# Patient Record
Sex: Female | Born: 1955
Health system: Southern US, Community
[De-identification: ages and names within clinical notes are randomized; demographics above are authoritative.]

## PROBLEM LIST (undated history)

## (undated) DIAGNOSIS — F32A Depression, unspecified: Secondary | ICD-10-CM

## (undated) DIAGNOSIS — F909 Attention-deficit hyperactivity disorder, unspecified type: Secondary | ICD-10-CM

## (undated) DIAGNOSIS — K219 Gastro-esophageal reflux disease without esophagitis: Secondary | ICD-10-CM

## (undated) DIAGNOSIS — G43909 Migraine, unspecified, not intractable, without status migrainosus: Secondary | ICD-10-CM

## (undated) DIAGNOSIS — I50812 Chronic right heart failure: Secondary | ICD-10-CM

## (undated) DIAGNOSIS — I2721 Secondary pulmonary arterial hypertension: Secondary | ICD-10-CM

## (undated) DIAGNOSIS — F329 Major depressive disorder, single episode, unspecified: Secondary | ICD-10-CM

## (undated) DIAGNOSIS — G2581 Restless legs syndrome: Secondary | ICD-10-CM

## (undated) HISTORY — PX: TONSILLECTOMY: SUR1361

## (undated) HISTORY — PX: CHOLECYSTECTOMY: SHX55

---

## 1997-10-14 ENCOUNTER — Ambulatory Visit (HOSPITAL_COMMUNITY): Admission: RE | Admit: 1997-10-14 | Discharge: 1997-10-14 | Payer: Self-pay | Admitting: Orthopedic Surgery

## 2002-04-14 ENCOUNTER — Emergency Department (HOSPITAL_COMMUNITY): Admission: EM | Admit: 2002-04-14 | Discharge: 2002-04-14 | Payer: Self-pay

## 2002-09-13 ENCOUNTER — Encounter: Admission: RE | Admit: 2002-09-13 | Discharge: 2002-09-13 | Payer: Self-pay | Admitting: Gastroenterology

## 2002-09-13 ENCOUNTER — Encounter: Payer: Self-pay | Admitting: Gastroenterology

## 2002-10-10 ENCOUNTER — Encounter (INDEPENDENT_AMBULATORY_CARE_PROVIDER_SITE_OTHER): Payer: Self-pay | Admitting: Specialist

## 2002-10-10 ENCOUNTER — Ambulatory Visit (HOSPITAL_COMMUNITY): Admission: RE | Admit: 2002-10-10 | Discharge: 2002-10-10 | Payer: Self-pay | Admitting: Gastroenterology

## 2003-07-22 ENCOUNTER — Ambulatory Visit (HOSPITAL_COMMUNITY): Admission: RE | Admit: 2003-07-22 | Discharge: 2003-07-22 | Payer: Self-pay | Admitting: General Surgery

## 2003-08-05 ENCOUNTER — Encounter (INDEPENDENT_AMBULATORY_CARE_PROVIDER_SITE_OTHER): Payer: Self-pay | Admitting: Specialist

## 2003-08-05 ENCOUNTER — Observation Stay (HOSPITAL_COMMUNITY): Admission: RE | Admit: 2003-08-05 | Discharge: 2003-08-06 | Payer: Self-pay | Admitting: General Surgery

## 2006-09-26 ENCOUNTER — Emergency Department (HOSPITAL_COMMUNITY): Admission: EM | Admit: 2006-09-26 | Discharge: 2006-09-27 | Payer: Self-pay | Admitting: Emergency Medicine

## 2008-01-18 ENCOUNTER — Observation Stay (HOSPITAL_COMMUNITY): Admission: EM | Admit: 2008-01-18 | Discharge: 2008-01-23 | Payer: Self-pay | Admitting: Emergency Medicine

## 2010-05-13 ENCOUNTER — Encounter
Admission: RE | Admit: 2010-05-13 | Discharge: 2010-05-13 | Payer: Self-pay | Source: Home / Self Care | Attending: Gastroenterology | Admitting: Gastroenterology

## 2010-09-22 NOTE — H&P (Signed)
[name], [name] NO.:  [id number]   MEDICAL RECORD NO.:  [id number]          PATIENT TYPE:  OBV   LOCATION:  3028                         FACILITY:  [facility]   PHYSICIAN:  Vania Rea, M.D. DATE OF BIRTH:  [birth year]   DATE OF ADMISSION:  01/18/2008  DATE OF DISCHARGE:                              HISTORY & PHYSICAL   PRIMARY CARE PHYSICIAN:  Dr. Derrek Gu at [facility].   CHIEF COMPLAINT:  Shortness of breath.   HISTORY OF PRESENT ILLNESS:  This is a 55 year old obese, Caucasian lady  who has a history of asthma but also recently has been abusing tobacco.  Developed upper respiratory symptoms 2-3 days ago consisting of runny  nose and congestion and feeling that she had sinusitis.  Did not get a  treatment, went to bed, and felt it drain backwards, started coughing,  getting very short of breath, brought up green sputum.  Went to her  primary care physician who diagnosed her with asthma exacerbation as  well as COPD.  She received albuterol inhaler, Rocephin injections and  prescription for Avelox and prednisone.  Return for evaluation the  following day and was not considered to be significantly improved.  Had  a chest x-ray which suggested pneumonia and was sent to the emergency  room for evaluation.  In our emergency room, the patient continues to be  short of breath and wheezing.  Chest x-ray also suggests bilateral lower  lobe pneumonia, and although the patient is afebrile, hospitalist  service was called to assist with management for acute exacerbation of  COPD and community acquired pneumonia.   The patient denies fever or chills.  She has been persistently short of  breath and feeling sick, but in fact, would love to go home.   PAST MEDICAL HISTORY:  1. Asthma.  2. Irritable bowel syndrome.  3. Restless leg syndrome.  4. Difficult with concentration.  5. Status post cholecystectomy.   MEDICATIONS:  1. Albuterol inhaler since  yesterday.  2. Advair 250/50 one puff b.i.d.  3. Simdax one tablet daily for concentration.  4. Zegerid one tablet daily.  5. Prednisone 40 mg daily, only 20 mg taken yesterday.  6. Avelox 400 mg daily, taken for the first time within the past 24      hours.  7. Rocephin 1 g in the doctor's office.   ALLERGIES:  CODEINE, SULFA AND ASPIRIN.   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  Denies alcohol or illicit drug use.   FAMILY HISTORY:  Strong for diabetes in her sister.   REVIEW OF SYSTEMS:  Other than noted above, a 10-point review of systems  is unremarkable.   PHYSICAL EXAMINATION:  GENERAL:  A pleasant but somewhat emotionally  distress Caucasian lady, sitting up in bed.  VITAL SIGNS:  Temperature 97.5, pulse 87, respirations 22, blood  pressure 132/56, saturating at 95% on 2 liters.  She was 86% on room  air.  HEENT:  Pupils are round equal and reactive.  Mucous membranes are pink  and anicteric.  She is not dehydrated. No cervical lymphadenopathy.  No  thyromegaly.  CHEST:  She has diffuse rhonchi and wheezing.  CARDIOVASCULAR:  Regular.  ABDOMEN:  Obese, soft and nontender.  EXTREMITIES:  Without edema.  He has 2+ pulses bilaterally.  CNS:  Cranial nerves II-XII grossly intact.  No focal neurological  deficits.   LABORATORY DATA:  White count is 16.8, hemoglobin 16.4, MCV 91,  platelets 276.  She actually has a normal differential on our white  count, but absolute granulocyte count is 12.  Her serum chemistry is  significant for a sodium of 3.1, glucose 112.  She is otherwise  unremarkable.  Chest x-ray shows mid lower lung airspace disease  compatible with pneumonia.   ASSESSMENT:  1. Acute respiratory failure.  2. Acute exacerbation of asthma causing the above.  3. Bilateral lower lobe pneumonia causing the above.   PLAN:  Will admit this lady for IV fluids, antibiotics, steroids and  serial nebulizations.  Other plans  as per orders.      Vania Rea, M.D.  Electronically Signed     LC/MEDQ  D:  01/18/2008  T:  01/18/2008  Job:  [id number]

## 2010-09-22 NOTE — Discharge Summary (Signed)
[name], [name]               ACCOUNT NO.:  [id number]   MEDICAL RECORD NO.:  [id number]          PATIENT TYPE:  OBV   LOCATION:  3028                         FACILITY:  [facility]   PHYSICIAN:  Elliot Cousin, M.D.    DATE OF BIRTH:  [birth year]   DATE OF ADMISSION:  01/17/2008  DATE OF DISCHARGE:  01/23/2008                               DISCHARGE SUMMARY   DISCHARGE DIAGNOSES:  1. Bilateral pneumonia.  2. Superimposed asthma exacerbation.  3. Hypoxia and mild hypercapnia, secondary to bilateral pneumonia and      superimposed asthma exacerbation.  4. Tobacco abuse.  5. Hyperglycemia, secondary to steroids.  6. Leukocytosis, secondary to pneumonia and steroids.  7. Anxiety.   DISCHARGE MEDICATIONS:  1. Prednisone taper to be taken as directed.  2. Albuterol MDI 2 puffs every 4 hours p.r.n.  3. Advair Diskus 250/50 one inhalation b.i.d.  4. Zegerid 1 tablet daily.  5. Avelox 400 mg daily until completed per the previous prescription      given by your primary care physician.   DISCHARGE DISPOSITION:  The patient is stable and in improved condition.  She is being discharged to home today.  She was advised to return to  work on January 30, 2008.   CONSULTATIONS:  None.   PROCEDURE PERFORMED:  1. Chest x-ray on January 22, 2008.  The results revealed improving      aeration to the lower lobes.  2. Chest x-ray on January 17, 2008.  The results revealed mild lower      lobe lung airspace disease compatible with pneumonia.   HISTORY OF PRESENT ILLNESS:  The patient is a 55 year old woman with a  past medical history significant for asthma and irritable bowel  syndrome, who presented to the emergency department on January 18, 2008, with a chief complaint of shortness of breath.  She was started on  outpatient treatment for what was thought to be sinusitis and an upper  respiratory infection with Avelox, one dose of Rocephin in the office,  and a prednisone taper by her  primary care physician.  When she did not  improve, she presented to the emergency department.  Her chest x-ray as  seen in the emergency department revealed bibasilar pneumonia.  At the  time of the patient's initial hospital assessment, she was oxygenating  86% on room air.  She was experiencing quite a bit of bronchospasms.  She was subsequently admitted for further evaluation and management.   For additional details, please see the dictated history and physical.   HOSPITAL COURSE:  1. BIBASILAR PNEUMONIA, SUPERIMPOSED ON ASTHMA EXACERBATION AND MILD      HYPOXIC/HYPERCAPNIC RESPIRATORY FAILURE.  The patient was started      on symptomatic treatment with albuterol and Atrovent nebulizers,      Robitussin, and oxygen therapy.  Sputum cultures were ordered and      the patient was subsequently started on antibiotic treatment with      Rocephin and azithromycin.  Because of the bronchospasms, Solu-      Medrol intravenously was initiated as well.  The patient  does smoke      approximately one-pack of cigarettes per day and therefore she was      counseled on tobacco cessation.  A nicotine patch was placed daily.      Over the past 24-48 hours, the patient has improved significantly.      She is significantly less bronchospasmic today.  An ABG on 2 L of      nasal cannula oxygen was ordered yesterday and it revealed a pH of      7.4, pCO2 of 56, and a pO2 of 61.  She was ambulated by the nursing      staff this morning without oxygen supplementation.  During      ambulation, her oxygen saturations ranged from 90-92%.  The results      of the sputum cultures were negative; they revealed normal      oropharyngeal flora.  Today, she is afebrile and her white blood      cell count has improved to 11.3.  She still has a slight      leukocytosis, which is felt to be secondary to steroids at this      time.  Solu-Medrol has been titrated down to 60 mg IV q.12 h.  She      will be discharged to  home today on a prednisone taper along with      Advair and albuterol MDI.  As above, she was started on Avelox in      the outpatient setting.  The patient was advised to continue Avelox      as previously ordered until completed.  2. HYPERGLYCEMIA SECONDARY TO STEROIDS.  The patient's capillary blood      glucose increased during the hospitalization.  She was started on      sliding scale NovoLog q.a.c. and nightly.  As the Solu-Medrol was      tapered down, her capillary blood glucose improved.  As of today,      her capillary blood glucose is 113.  3. ANXIETY.  The patient was started on as-needed Ativan and then      subsequently as-needed Xanax during the hospitalization.  She is      currently stable and improved.      Elliot Cousin, M.D.  Electronically Signed     DF/MEDQ  D:  01/23/2008  T:  01/24/2008  Job:  [id number]   cc:   Lelon Mast, MD

## 2010-09-25 NOTE — Op Note (Signed)
[name], [name]                         ACCOUNT NO.:  [id number]   MEDICAL RECORD NO.:  [id number]                   PATIENT TYPE:  OBV   LOCATION:  0457                                 FACILITY:  [facility]   PHYSICIAN:  Timothy E. Earlene Plater, M.D.              DATE OF BIRTH:  28-Feb-1956   DATE OF PROCEDURE:  08/05/2003  DATE OF DISCHARGE:                                 OPERATIVE REPORT   PREOPERATIVE DIAGNOSIS:  Chronic cholecystolithiasis.   POSTOPERATIVE DIAGNOSIS:  Chronic cholecystolithiasis.   OPERATION/PROCEDURE:  Laparoscopic cholecystectomy.   SURGEON:  Timothy E. Earlene Plater, M.D.   ASSISTANT:  Rose Phi. Maple Hudson, M.D.   ANESTHESIA:  General.   INDICATIONS:  [name] has been seen in the office.  She has known  gallstones and symptoms thereof.  She also has reflux and diarrhea which are  chronic in nature.  She has been carefully evaluated and advised and she  wishes to proceed with surgery.  She was identified and the permit signed.   DESCRIPTION OF PROCEDURE:  The patient is taken to the operating room and  placed supine. General endotracheal anesthesia administered.  The abdomen is  prepped and draped in the usual fashion.  Each incision was anesthetized  with 0.25% Marcaine with epinephrine.  The infraumbilical incision made,  fascia identified and opened vertically, peritoneum entered without  complications.  Hasson catheter was placed through this opening and tied in  placed with a ##1 Vicryl.  General peritoneoscopy was carried out showing no  gross abnormalities but there was more than abundant fat in the omentum.  A  second 10 mm trocar was placed in the mid epigastric, two 5 mm trocars in  the right upper quadrant.  Gallbladder was identified in the usual position.  It was grasped and the omental adhesions were taken down and then it was  demonstrated and inspected.  Careful dissection at the base of the  gallbladder revealed a normal-appearing cystic duct and  artery.  These were  dissected out.  The cystic artery was triply clipped.  The cystic duct was  clipped near the gallbladder.  A cholangiogram was then made passing the  catheter percutaneously and into the cystic duct.  Cholangiography carried  out in real time showing smooth and swift filling of the biliary tree and  emptying of dye into the duodenum.  There were no abnormalities.  The clip  and catheter were removed.  Then the cystic duct was triply clipped and  divided.  The artery was divided.  Two additional small arteries posteriorly  were clipped and divided.  The gallbladder was removed from the gallbladder  bed without complications.  It was carefully inspected.  The bed was dry and  there was no blood or bile.  Irrigation was clear.   The gallbladder was then grasped and removed through the infraumbilical  incision which was then tied.  This was done under  direct vision.  Irrigation carried out and was clear.  And then all instruments, trocars,  irrigant and CO2  removed.  Counts were correct.  The patient tolerated it well.  Skin  incisions were all inspected, cauterized where needed and closed with 4-0  Monocryl.  Steri-Strips applied, dry sterile dressing applied.  She was  awakened and taken to the recovery room in good condition.                                               Timothy E. Earlene Plater, M.D.    TED/MEDQ  D:  08/05/2003  T:  08/05/2003  Job:  [id number]   cc:   Anselmo Rod, M.D.  [address]  [address]  [place]  [place] [address]  Fax: [phone]

## 2010-09-25 NOTE — Op Note (Signed)
   NAMEJANAE, BONSER                         ACCOUNT NO.:  [id number]   MEDICAL RECORD NO.:  [id number]                   PATIENT TYPE:  AMB   LOCATION:  ENDO                                 FACILITY:  [facility]   PHYSICIAN:  Anselmo Rod, M.D.               DATE OF BIRTH:  04-Apr-1956   DATE OF PROCEDURE:  10/10/2002  DATE OF DISCHARGE:                                 OPERATIVE REPORT   PROCEDURE:  Colonoscopy with biopsies.   ENDOSCOPIST:  Anselmo Rod, M.D.   INSTRUMENT USED:  Olympus video colonoscope.   INDICATION FOR PROCEDURE:  A 46white female with a history of change in  bowel habits, diarrhea for the last eight months, and left lower quadrant  pain.  Rule out colonic polyps, masses, etc.   PREPROCEDURE PREPARATION:  Informed consent was procured from the patient.  The patient had fasted for eight hours prior to the procedure and prepped  with a bottle of magnesium citrate and a gallon of GoLYTELY the night prior  to the procedure.   PREPROCEDURE PHYSICAL:  VITAL SIGNS:  The patient had stable vital signs.  NECK:  Supple.  CHEST:  Clear to auscultation.  S1, S2 regular.  ABDOMEN:  Soft with normal bowel sounds.   DESCRIPTION OF PROCEDURE:  The patient was placed in the left lateral  decubitus position and sedated with 90 mg of Demerol and 8 mg of Versed  intravenously.  Once the patient was adequately sedate and maintained on low-  flow oxygen and continuous cardiac monitoring, the Olympus video colonoscope  was advanced from the rectum to the cecum without difficulty.  No masses,  polyps, erosions, or ulcerations were seen.  An irregular fold was biopsied  in the proximal right colon just distal to the cecum.  This fold had some  nodular changes in it and was soft to biopsy.  The appendiceal orifice and  the ileocecal valve were clearly visualized and photographed.   IMPRESSION:  Normal colonoscopy except for an irregular fold with nodular  changes in the  proximal right colon (just distal to the cecum), biopsied for  pathology.    RECOMMENDATIONS:  1. Await pathology.  2. Outpatient follow-up in the next two weeks for further recommendations.                                               Anselmo Rod, M.D.    JNM/MEDQ  D:  10/10/2002  T:  10/10/2002  Job:  [id number]   cc:   Gabriel Earing, M.D.  [address]  [place]  [place] [address]  Fax: [phone]

## 2011-01-07 DIAGNOSIS — J4530 Mild persistent asthma, uncomplicated: Secondary | ICD-10-CM | POA: Insufficient documentation

## 2011-01-07 DIAGNOSIS — G2581 Restless legs syndrome: Secondary | ICD-10-CM | POA: Insufficient documentation

## 2011-02-10 LAB — CBC
HCT: 41.8
HCT: 44.8
HCT: 49.3 — ABNORMAL HIGH
Hemoglobin: 15.1 — ABNORMAL HIGH
Hemoglobin: 16.4 — ABNORMAL HIGH
MCHC: 33.1
MCHC: 33.2
MCHC: 33.3
MCV: 90.4
MCV: 91
MCV: 91.1
MCV: 92.1
Platelets: 238
RBC: 4.6
RBC: 4.62
RBC: 5.42 — ABNORMAL HIGH
RDW: 13.8
WBC: 11.3 — ABNORMAL HIGH
WBC: 11.8 — ABNORMAL HIGH
WBC: 16.8 — ABNORMAL HIGH

## 2011-02-10 LAB — GLUCOSE, CAPILLARY
Glucose-Capillary: 140 — ABNORMAL HIGH
Glucose-Capillary: 168 — ABNORMAL HIGH
Glucose-Capillary: 172 — ABNORMAL HIGH
Glucose-Capillary: 186 — ABNORMAL HIGH
Glucose-Capillary: 189 — ABNORMAL HIGH

## 2011-02-10 LAB — EXPECTORATED SPUTUM ASSESSMENT W GRAM STAIN, RFLX TO RESP C

## 2011-02-10 LAB — DIFFERENTIAL
Basophils Absolute: 0
Basophils Relative: 0
Eosinophils Absolute: 0
Eosinophils Absolute: 0
Eosinophils Relative: 0
Lymphs Abs: 3.1
Monocytes Absolute: 1.6 — ABNORMAL HIGH
Monocytes Relative: 10
Neutrophils Relative %: 71
Neutrophils Relative %: 91 — ABNORMAL HIGH

## 2011-02-10 LAB — BASIC METABOLIC PANEL
Chloride: 102
Chloride: 104
Chloride: 97
Creatinine, Ser: 0.59
GFR calc Af Amer: >60
GFR calc Af Amer: >60
GFR calc non Af Amer: >60
Glucose, Bld: 196 — ABNORMAL HIGH
Potassium: 3.9
Potassium: 4.5
Potassium: 5
Sodium: 140

## 2011-02-10 LAB — POCT I-STAT, CHEM 8
Calcium, Ion: 1.05 — ABNORMAL LOW
Creatinine, Ser: 1
Glucose, Bld: 102 — ABNORMAL HIGH
Hemoglobin: 17.3 — ABNORMAL HIGH
Potassium: 3.1 — ABNORMAL LOW

## 2011-02-10 LAB — BLOOD GAS, ARTERIAL
Acid-Base Excess: 9 — ABNORMAL HIGH
Bicarbonate: 33.9 — ABNORMAL HIGH
Patient temperature: 98.6
TCO2: 35.6
pCO2 arterial: 55.9 — ABNORMAL HIGH
pH, Arterial: 7.401 — ABNORMAL HIGH

## 2011-02-10 LAB — CULTURE, RESPIRATORY W GRAM STAIN

## 2011-09-20 DIAGNOSIS — F3342 Major depressive disorder, recurrent, in full remission: Secondary | ICD-10-CM | POA: Insufficient documentation

## 2012-04-17 ENCOUNTER — Emergency Department (HOSPITAL_BASED_OUTPATIENT_CLINIC_OR_DEPARTMENT_OTHER)
Admission: EM | Admit: 2012-04-17 | Discharge: 2012-04-17 | Disposition: A | Discharge status: Home / Self Care | Payer: 59 | Attending: Emergency Medicine | Admitting: Emergency Medicine

## 2012-04-17 ENCOUNTER — Encounter (HOSPITAL_BASED_OUTPATIENT_CLINIC_OR_DEPARTMENT_OTHER): Payer: Self-pay | Admitting: *Deleted

## 2012-04-17 DIAGNOSIS — G43909 Migraine, unspecified, not intractable, without status migrainosus: Secondary | ICD-10-CM | POA: Insufficient documentation

## 2012-04-17 DIAGNOSIS — F17200 Nicotine dependence, unspecified, uncomplicated: Secondary | ICD-10-CM | POA: Insufficient documentation

## 2012-04-17 DIAGNOSIS — R209 Unspecified disturbances of skin sensation: Secondary | ICD-10-CM | POA: Insufficient documentation

## 2012-04-17 DIAGNOSIS — Z8719 Personal history of other diseases of the digestive system: Secondary | ICD-10-CM | POA: Insufficient documentation

## 2012-04-17 DIAGNOSIS — Z79899 Other long term (current) drug therapy: Secondary | ICD-10-CM | POA: Insufficient documentation

## 2012-04-17 DIAGNOSIS — G2581 Restless legs syndrome: Secondary | ICD-10-CM | POA: Insufficient documentation

## 2012-04-17 HISTORY — DX: Gastro-esophageal reflux disease without esophagitis: K21.9

## 2012-04-17 HISTORY — DX: Restless legs syndrome: G25.81

## 2012-04-17 HISTORY — DX: Migraine, unspecified, not intractable, without status migrainosus: G43.909

## 2012-04-17 MED ORDER — DIPHENHYDRAMINE HCL 50 MG/ML IJ SOLN
25.0000 mg | Freq: Once | INTRAMUSCULAR | Status: AC
  Administered 2012-04-17: 25 mg via INTRAVENOUS
  Filled 2012-04-17: qty 1

## 2012-04-17 MED ORDER — METOCLOPRAMIDE HCL 5 MG/ML IJ SOLN
10.0000 mg | Freq: Once | INTRAMUSCULAR | Status: AC
  Administered 2012-04-17: 10 mg via INTRAVENOUS
  Filled 2012-04-17: qty 2

## 2012-04-17 MED ORDER — DIPHENHYDRAMINE HCL 25 MG PO TABS: 50.0000 mg | ORAL_TABLET | Freq: Every evening | ORAL | Status: DC | PRN

## 2012-04-17 MED ORDER — SODIUM CHLORIDE 0.9 % IV BOLUS (SEPSIS)
1000.0000 mL | Freq: Once | INTRAVENOUS | Status: AC
  Administered 2012-04-17: 1000 mL via INTRAVENOUS

## 2012-04-17 MED ORDER — METOCLOPRAMIDE HCL 10 MG PO TABS: 10.0000 mg | ORAL_TABLET | Freq: Four times a day (QID) | ORAL | Status: DC

## 2012-04-17 MED ORDER — HYDROMORPHONE HCL PF 1 MG/ML IJ SOLN
1.0000 mg | Freq: Once | INTRAMUSCULAR | Status: AC
  Administered 2012-04-17: 1 mg via INTRAVENOUS
  Filled 2012-04-17: qty 1

## 2012-04-17 NOTE — ED Notes (Signed)
In to reassess the Pt. And she is sleeping with eyes closed.  Pt. Awakens to voice and is in no distress.  Pt. Sitting up in the bed at present time with husband at bedside.

## 2012-04-17 NOTE — ED Provider Notes (Signed)
History     CSN: [id number]  Arrival date & time 04/17/12  1830   First MD Initiated Contact with Patient 04/17/12 1938      Chief Complaint  Patient presents with  . Headache    (Consider location/radiation/quality/duration/timing/severity/associated sxs/prior treatment) HPI Comments: Patient is a 56 year old female with a past medical history of migraines who presents with a headache for 2 days. Patient reports a gradual onset and progressive worsening of the headache. The pain is sharp, constant and is located in generalized head without radiation. Patient has tried nothing for symptoms without relief. No alleviating/aggravating factors. Patient reports associated nausea and photophobia. Patient also reports left arm and left leg numbness and tingling that started today and has been continuous. Her doctor was concerned about a stroke and sent her to the ED for evaluation. However, patient states her left arm numbness starts in her wrist and radiates to her hand and her left leg tingling starts in her left buttock and radiates down her leg. Patient denies fever, vomiting, diarrhea, weakness, visual changes, congestion, chest pain, SOB, abdominal pain.      Past Medical History  Diagnosis Date  . Migraine   . GERD (gastroesophageal reflux disease)   . Restless leg syndrome     Past Surgical History  Procedure Date  . C sections   . Cholecystectomy   . Tonsillectomy     No family history on file.  History  Substance Use Topics  . Smoking status: Current Every Day Smoker -- 0.5 packs/day    Types: Cigarettes  . Smokeless tobacco: Not on file  . Alcohol Use: No    OB History    Grav Para Term Preterm Abortions TAB SAB Ect Mult Living                  Review of Systems  Neurological: Positive for numbness and headaches.  All other systems reviewed and are negative.    Allergies  Asa; Codeine; and Sulfa antibiotics  Home Medications   Current Outpatient Rx   Name  Route  Sig  Dispense  Refill  . ADDERALL PO   Oral   Take by mouth.         Marland Kitchen HYDROCHLOROTHIAZIDE PO   Oral   Take by mouth.         Marland Kitchen VICODIN PO   Oral   Take by mouth.           BP 150/81  Pulse 93  Temp 99 F (37.2 C) (Oral)  Resp 24  SpO2 96%  Physical Exam  Nursing note and vitals reviewed. Constitutional: She is oriented to person, place, and time. She appears well-developed and well-nourished. No distress.  HENT:  Head: Normocephalic and atraumatic.  Mouth/Throat: Oropharynx is clear and moist. No oropharyngeal exudate.  Eyes: Conjunctivae normal and EOM are normal. Pupils are equal, round, and reactive to light. No scleral icterus.  Neck: Normal range of motion. Neck supple.  Cardiovascular: Normal rate and regular rhythm.  Exam reveals no gallop and no friction rub.   No murmur heard. Pulmonary/Chest: Effort normal and breath sounds normal. She has no wheezes. She has no rales. She exhibits no tenderness.  Abdominal: Soft. There is no tenderness.  Musculoskeletal: Normal range of motion.  Neurological: She is alert and oriented to person, place, and time. No cranial nerve deficit. Coordination normal.       Strength and sensation equal and intact bilaterally. Cerebellar testing done without difficulty. Speech  is goal-oriented. Moves limbs without ataxia.   Skin: Skin is warm and dry. She is not diaphoretic.  Psychiatric: She has a normal mood and affect. Her behavior is normal.    ED Course  Procedures (including critical care time)  Labs Reviewed - No data to display No results found.   1. Migraine       MDM  9:45 PM Patient given reglan and benadryl for headache which provided mild relief. I will re order the reglan and benadryl and and also give her dilaudid.   Patient reports feeling much better and would like to go home.         Emilia Beck, PA-C 04/18/12 0009

## 2012-04-17 NOTE — ED Notes (Addendum)
Headache since yesterday. Numbness in her left arm and leg. Hx of same. She was seen by her MD for pain and given Phenergan. He sent her here for evaluation of numbness.

## 2012-04-18 NOTE — ED Provider Notes (Signed)
Medical screening examination/treatment/procedure(s) were performed by non-physician practitioner and as supervising physician I was immediately available for consultation/collaboration.   Naethan Bracewell B. Bernette Mayers, MD 04/18/12 1505

## 2013-02-20 ENCOUNTER — Other Ambulatory Visit: Payer: Self-pay | Admitting: Family Medicine

## 2013-02-20 DIAGNOSIS — Z1231 Encounter for screening mammogram for malignant neoplasm of breast: Secondary | ICD-10-CM

## 2016-07-19 DIAGNOSIS — E119 Type 2 diabetes mellitus without complications: Secondary | ICD-10-CM | POA: Insufficient documentation

## 2016-07-19 DIAGNOSIS — E1159 Type 2 diabetes mellitus with other circulatory complications: Secondary | ICD-10-CM | POA: Insufficient documentation

## 2016-07-19 DIAGNOSIS — E785 Hyperlipidemia, unspecified: Secondary | ICD-10-CM

## 2016-07-19 DIAGNOSIS — E1169 Type 2 diabetes mellitus with other specified complication: Secondary | ICD-10-CM | POA: Insufficient documentation

## 2016-08-16 DIAGNOSIS — J301 Allergic rhinitis due to pollen: Secondary | ICD-10-CM | POA: Insufficient documentation

## 2017-08-01 DIAGNOSIS — D126 Benign neoplasm of colon, unspecified: Secondary | ICD-10-CM | POA: Insufficient documentation

## 2018-02-22 DIAGNOSIS — F1320 Sedative, hypnotic or anxiolytic dependence, uncomplicated: Secondary | ICD-10-CM | POA: Insufficient documentation

## 2018-04-22 ENCOUNTER — Encounter (HOSPITAL_COMMUNITY): Payer: Self-pay | Admitting: Internal Medicine

## 2018-04-22 ENCOUNTER — Emergency Department (HOSPITAL_COMMUNITY): Payer: 59

## 2018-04-22 ENCOUNTER — Inpatient Hospital Stay (HOSPITAL_COMMUNITY)
Admission: EM | Admit: 2018-04-22 | Discharge: 2018-04-27 | DRG: 190 | Disposition: A | Discharge status: Home Health | Payer: 59 | Attending: Internal Medicine | Admitting: Internal Medicine

## 2018-04-22 ENCOUNTER — Other Ambulatory Visit: Payer: Self-pay

## 2018-04-22 DIAGNOSIS — E860 Dehydration: Secondary | ICD-10-CM | POA: Diagnosis present

## 2018-04-22 DIAGNOSIS — R609 Edema, unspecified: Secondary | ICD-10-CM

## 2018-04-22 DIAGNOSIS — F17200 Nicotine dependence, unspecified, uncomplicated: Secondary | ICD-10-CM | POA: Diagnosis present

## 2018-04-22 DIAGNOSIS — Z6835 Body mass index (BMI) 35.0-35.9, adult: Secondary | ICD-10-CM | POA: Diagnosis not present

## 2018-04-22 DIAGNOSIS — I509 Heart failure, unspecified: Secondary | ICD-10-CM

## 2018-04-22 DIAGNOSIS — F909 Attention-deficit hyperactivity disorder, unspecified type: Secondary | ICD-10-CM | POA: Diagnosis present

## 2018-04-22 DIAGNOSIS — N179 Acute kidney failure, unspecified: Secondary | ICD-10-CM | POA: Diagnosis present

## 2018-04-22 DIAGNOSIS — J9601 Acute respiratory failure with hypoxia: Secondary | ICD-10-CM | POA: Diagnosis not present

## 2018-04-22 DIAGNOSIS — Z886 Allergy status to analgesic agent status: Secondary | ICD-10-CM

## 2018-04-22 DIAGNOSIS — R0902 Hypoxemia: Secondary | ICD-10-CM

## 2018-04-22 DIAGNOSIS — I5082 Biventricular heart failure: Secondary | ICD-10-CM | POA: Diagnosis present

## 2018-04-22 DIAGNOSIS — R7881 Bacteremia: Secondary | ICD-10-CM

## 2018-04-22 DIAGNOSIS — K5900 Constipation, unspecified: Secondary | ICD-10-CM | POA: Diagnosis present

## 2018-04-22 DIAGNOSIS — E876 Hypokalemia: Secondary | ICD-10-CM | POA: Diagnosis present

## 2018-04-22 DIAGNOSIS — Z825 Family history of asthma and other chronic lower respiratory diseases: Secondary | ICD-10-CM

## 2018-04-22 DIAGNOSIS — M79604 Pain in right leg: Secondary | ICD-10-CM | POA: Diagnosis present

## 2018-04-22 DIAGNOSIS — R238 Other skin changes: Secondary | ICD-10-CM | POA: Diagnosis not present

## 2018-04-22 DIAGNOSIS — K219 Gastro-esophageal reflux disease without esophagitis: Secondary | ICD-10-CM | POA: Diagnosis present

## 2018-04-22 DIAGNOSIS — E662 Morbid (severe) obesity with alveolar hypoventilation: Secondary | ICD-10-CM | POA: Diagnosis present

## 2018-04-22 DIAGNOSIS — M7989 Other specified soft tissue disorders: Secondary | ICD-10-CM | POA: Diagnosis present

## 2018-04-22 DIAGNOSIS — T45515A Adverse effect of anticoagulants, initial encounter: Secondary | ICD-10-CM | POA: Diagnosis present

## 2018-04-22 DIAGNOSIS — D72829 Elevated white blood cell count, unspecified: Secondary | ICD-10-CM | POA: Diagnosis present

## 2018-04-22 DIAGNOSIS — R7989 Other specified abnormal findings of blood chemistry: Secondary | ICD-10-CM | POA: Diagnosis not present

## 2018-04-22 DIAGNOSIS — F329 Major depressive disorder, single episode, unspecified: Secondary | ICD-10-CM | POA: Diagnosis present

## 2018-04-22 DIAGNOSIS — I50812 Chronic right heart failure: Secondary | ICD-10-CM | POA: Diagnosis not present

## 2018-04-22 DIAGNOSIS — Z79899 Other long term (current) drug therapy: Secondary | ICD-10-CM

## 2018-04-22 DIAGNOSIS — J441 Chronic obstructive pulmonary disease with (acute) exacerbation: Secondary | ICD-10-CM | POA: Diagnosis present

## 2018-04-22 DIAGNOSIS — E878 Other disorders of electrolyte and fluid balance, not elsewhere classified: Secondary | ICD-10-CM | POA: Diagnosis present

## 2018-04-22 DIAGNOSIS — E1165 Type 2 diabetes mellitus with hyperglycemia: Secondary | ICD-10-CM | POA: Diagnosis present

## 2018-04-22 DIAGNOSIS — B957 Other staphylococcus as the cause of diseases classified elsewhere: Secondary | ICD-10-CM | POA: Diagnosis present

## 2018-04-22 DIAGNOSIS — Z87891 Personal history of nicotine dependence: Secondary | ICD-10-CM | POA: Diagnosis not present

## 2018-04-22 DIAGNOSIS — Z881 Allergy status to other antibiotic agents status: Secondary | ICD-10-CM | POA: Diagnosis not present

## 2018-04-22 DIAGNOSIS — E874 Mixed disorder of acid-base balance: Secondary | ICD-10-CM | POA: Diagnosis present

## 2018-04-22 DIAGNOSIS — I2721 Secondary pulmonary arterial hypertension: Secondary | ICD-10-CM | POA: Diagnosis present

## 2018-04-22 DIAGNOSIS — R0602 Shortness of breath: Secondary | ICD-10-CM

## 2018-04-22 DIAGNOSIS — I5033 Acute on chronic diastolic (congestive) heart failure: Secondary | ICD-10-CM | POA: Diagnosis present

## 2018-04-22 DIAGNOSIS — R109 Unspecified abdominal pain: Secondary | ICD-10-CM | POA: Diagnosis present

## 2018-04-22 DIAGNOSIS — I272 Pulmonary hypertension, unspecified: Secondary | ICD-10-CM

## 2018-04-22 DIAGNOSIS — E119 Type 2 diabetes mellitus without complications: Secondary | ICD-10-CM | POA: Diagnosis not present

## 2018-04-22 DIAGNOSIS — G2581 Restless legs syndrome: Secondary | ICD-10-CM | POA: Diagnosis present

## 2018-04-22 DIAGNOSIS — E785 Hyperlipidemia, unspecified: Secondary | ICD-10-CM | POA: Diagnosis present

## 2018-04-22 DIAGNOSIS — M79605 Pain in left leg: Secondary | ICD-10-CM | POA: Diagnosis present

## 2018-04-22 DIAGNOSIS — R0600 Dyspnea, unspecified: Secondary | ICD-10-CM

## 2018-04-22 DIAGNOSIS — Z882 Allergy status to sulfonamides status: Secondary | ICD-10-CM

## 2018-04-22 DIAGNOSIS — E669 Obesity, unspecified: Secondary | ICD-10-CM

## 2018-04-22 DIAGNOSIS — J9611 Chronic respiratory failure with hypoxia: Secondary | ICD-10-CM | POA: Diagnosis present

## 2018-04-22 DIAGNOSIS — M79606 Pain in leg, unspecified: Secondary | ICD-10-CM

## 2018-04-22 DIAGNOSIS — Z885 Allergy status to narcotic agent status: Secondary | ICD-10-CM

## 2018-04-22 DIAGNOSIS — E873 Alkalosis: Secondary | ICD-10-CM | POA: Diagnosis present

## 2018-04-22 DIAGNOSIS — Z7984 Long term (current) use of oral hypoglycemic drugs: Secondary | ICD-10-CM

## 2018-04-22 DIAGNOSIS — J449 Chronic obstructive pulmonary disease, unspecified: Secondary | ICD-10-CM | POA: Diagnosis not present

## 2018-04-22 HISTORY — DX: Major depressive disorder, single episode, unspecified: F32.9

## 2018-04-22 HISTORY — DX: Depression, unspecified: F32.A

## 2018-04-22 HISTORY — DX: Attention-deficit hyperactivity disorder, unspecified type: F90.9

## 2018-04-22 HISTORY — DX: Chronic right heart failure: I50.812

## 2018-04-22 HISTORY — DX: Secondary pulmonary arterial hypertension: I27.21

## 2018-04-22 LAB — CBC WITH DIFFERENTIAL/PLATELET
Abs Immature Granulocytes: 0.05 10*3/uL (ref 0.00–0.07)
Basophils Absolute: 0.1 10*3/uL (ref 0.0–0.1)
Basophils Relative: 1 %
EOS PCT: 0 %
Eosinophils Absolute: 0 10*3/uL (ref 0.0–0.5)
HEMATOCRIT: 55.3 % — AB (ref 36.0–46.0)
Hemoglobin: 16.6 g/dL — ABNORMAL HIGH (ref 12.0–15.0)
Immature Granulocytes: 0 %
LYMPHS ABS: 1.9 10*3/uL (ref 0.7–4.0)
Lymphocytes Relative: 16 %
MCH: 26.2 pg (ref 26.0–34.0)
MCHC: 30 g/dL (ref 30.0–36.0)
MCV: 87.2 fL (ref 80.0–100.0)
MONOS PCT: 9 %
Monocytes Absolute: 1.1 10*3/uL — ABNORMAL HIGH (ref 0.1–1.0)
Neutro Abs: 9.1 10*3/uL — ABNORMAL HIGH (ref 1.7–7.7)
Neutrophils Relative %: 74 %
Platelets: 331 10*3/uL (ref 150–400)
RBC: 6.34 MIL/uL — ABNORMAL HIGH (ref 3.87–5.11)
RDW: 20.2 % — AB (ref 11.5–15.5)
WBC: 12.4 10*3/uL — ABNORMAL HIGH (ref 4.0–10.5)
nRBC: 0 % (ref 0.0–0.2)

## 2018-04-22 LAB — COMPREHENSIVE METABOLIC PANEL
ALT: 17 U/L (ref 0–44)
ANION GAP: 22 — AB (ref 5–15)
AST: 43 U/L — ABNORMAL HIGH (ref 15–41)
Albumin: 3.7 g/dL (ref 3.5–5.0)
Alkaline Phosphatase: 88 U/L (ref 38–126)
BUN: 45 mg/dL — ABNORMAL HIGH (ref 8–23)
CO2: 41 mmol/L — ABNORMAL HIGH (ref 22–32)
Calcium: 9.7 mg/dL (ref 8.9–10.3)
Chloride: 68 mmol/L — ABNORMAL LOW (ref 98–111)
Creatinine, Ser: 1.27 mg/dL — ABNORMAL HIGH (ref 0.44–1.00)
GFR calc Af Amer: 53 mL/min — ABNORMAL LOW (ref >60)
GFR, EST NON AFRICAN AMERICAN: 46 mL/min — AB (ref >60)
Glucose, Bld: 136 mg/dL — ABNORMAL HIGH (ref 70–99)
POTASSIUM: 3.3 mmol/L — AB (ref 3.5–5.1)
Sodium: 131 mmol/L — ABNORMAL LOW (ref 135–145)
Total Bilirubin: 3.2 mg/dL — ABNORMAL HIGH (ref 0.3–1.2)
Total Protein: 7.1 g/dL (ref 6.5–8.1)

## 2018-04-22 LAB — URINALYSIS, ROUTINE W REFLEX MICROSCOPIC
BACTERIA UA: NONE SEEN
Bilirubin Urine: NEGATIVE
Glucose, UA: NEGATIVE mg/dL
Hgb urine dipstick: NEGATIVE
Ketones, ur: NEGATIVE mg/dL
Nitrite: NEGATIVE
PH: 7 (ref 5.0–8.0)
Protein, ur: NEGATIVE mg/dL
SPECIFIC GRAVITY, URINE: 1.008 (ref 1.005–1.030)

## 2018-04-22 LAB — I-STAT TROPONIN, ED
Troponin i, poc: 0.05 ng/mL (ref 0.00–0.08)
Troponin i, poc: 0.04 ng/mL (ref 0.00–0.08)

## 2018-04-22 LAB — I-STAT CG4 LACTIC ACID, ED
LACTIC ACID, VENOUS: 3.25 mmol/L — AB (ref 0.5–1.9)
Lactic Acid, Venous: 1.58 mmol/L (ref 0.5–1.9)

## 2018-04-22 LAB — BRAIN NATRIURETIC PEPTIDE: B NATRIURETIC PEPTIDE 5: 501.9 pg/mL — AB (ref 0.0–100.0)

## 2018-04-22 LAB — LIPASE, BLOOD: LIPASE: 30 U/L (ref 11–51)

## 2018-04-22 LAB — CBG MONITORING, ED: Glucose-Capillary: 158 mg/dL — ABNORMAL HIGH (ref 70–99)

## 2018-04-22 MED ORDER — MONTELUKAST SODIUM 10 MG PO TABS: 10.0000 mg | ORAL_TABLET | Freq: Every day | ORAL | Status: DC

## 2018-04-22 MED ORDER — HYDROCODONE-ACETAMINOPHEN 5-325 MG PO TABS
1.0000 | ORAL_TABLET | Freq: Every evening | ORAL | Status: DC | PRN
  Administered 2018-04-22 – 2018-04-26 (×5): 1 via ORAL
  Filled 2018-04-22 (×6): qty 1

## 2018-04-22 MED ORDER — INSULIN ASPART 100 UNIT/ML ~~LOC~~ SOLN: 0.0000 [IU] | Freq: Three times a day (TID) | SUBCUTANEOUS | Status: DC

## 2018-04-22 MED ORDER — MOMETASONE FURO-FORMOTEROL FUM 200-5 MCG/ACT IN AERO
1.0000 | INHALATION_SPRAY | Freq: Two times a day (BID) | RESPIRATORY_TRACT | Status: DC
  Administered 2018-04-22 – 2018-04-27 (×9): 1 via RESPIRATORY_TRACT
  Filled 2018-04-22: qty 8.8

## 2018-04-22 MED ORDER — MONTELUKAST SODIUM 10 MG PO TABS
10.0000 mg | ORAL_TABLET | Freq: Every day | ORAL | Status: DC
  Administered 2018-04-23 – 2018-04-27 (×5): 10 mg via ORAL
  Filled 2018-04-22 (×5): qty 1

## 2018-04-22 MED ORDER — ESCITALOPRAM OXALATE 20 MG PO TABS
20.0000 mg | ORAL_TABLET | Freq: Every day | ORAL | Status: DC
  Administered 2018-04-23 – 2018-04-27 (×5): 20 mg via ORAL
  Filled 2018-04-22 (×5): qty 1

## 2018-04-22 MED ORDER — ONDANSETRON HCL 4 MG/2ML IJ SOLN: 4.0000 mg | Freq: Four times a day (QID) | INTRAMUSCULAR | Status: DC | PRN

## 2018-04-22 MED ORDER — ACETAMINOPHEN 650 MG RE SUPP: 650.0000 mg | Freq: Four times a day (QID) | RECTAL | Status: DC | PRN

## 2018-04-22 MED ORDER — ALBUTEROL SULFATE (2.5 MG/3ML) 0.083% IN NEBU: 2.5000 mg | INHALATION_SOLUTION | RESPIRATORY_TRACT | Status: DC | PRN

## 2018-04-22 MED ORDER — ONDANSETRON HCL 4 MG PO TABS: 4.0000 mg | ORAL_TABLET | Freq: Four times a day (QID) | ORAL | Status: DC | PRN

## 2018-04-22 MED ORDER — ZOLPIDEM TARTRATE 5 MG PO TABS
5.0000 mg | ORAL_TABLET | Freq: Every evening | ORAL | Status: DC | PRN
  Administered 2018-04-23 – 2018-04-26 (×5): 5 mg via ORAL
  Filled 2018-04-22 (×5): qty 1

## 2018-04-22 MED ORDER — ENOXAPARIN SODIUM 40 MG/0.4ML ~~LOC~~ SOLN
40.0000 mg | SUBCUTANEOUS | Status: DC
  Administered 2018-04-23 – 2018-04-27 (×5): 40 mg via SUBCUTANEOUS
  Filled 2018-04-22 (×5): qty 0.4

## 2018-04-22 MED ORDER — POTASSIUM CHLORIDE CRYS ER 20 MEQ PO TBCR
20.0000 meq | EXTENDED_RELEASE_TABLET | Freq: Once | ORAL | Status: AC
  Administered 2018-04-22: 20 meq via ORAL
  Filled 2018-04-22: qty 1

## 2018-04-22 MED ORDER — ACETAMINOPHEN 325 MG PO TABS
650.0000 mg | ORAL_TABLET | Freq: Four times a day (QID) | ORAL | Status: DC | PRN
  Administered 2018-04-24 – 2018-04-27 (×3): 650 mg via ORAL
  Filled 2018-04-22 (×3): qty 2

## 2018-04-22 MED ORDER — ATORVASTATIN CALCIUM 40 MG PO TABS
40.0000 mg | ORAL_TABLET | Freq: Every day | ORAL | Status: DC
  Administered 2018-04-23 – 2018-04-27 (×5): 40 mg via ORAL
  Filled 2018-04-22 (×5): qty 1

## 2018-04-22 NOTE — Progress Notes (Signed)
*  Preliminary Results* Bilateral lower extremity venous duplex completed. Bilateral lower extremities are negative for deep vein thrombosis. There is no evidence of Baker's cyst bilaterally.  04/22/2018 6:45 PM Adyn Hoes Dawna Part

## 2018-04-22 NOTE — ED Notes (Signed)
Pt ambulated to bathroom and back to room. Pt's O2 sats 66% on room air. Increased O2 flow with improvement to 94%. Will continue to monitor.

## 2018-04-22 NOTE — H&P (Addendum)
History and Physical    [name] UXN:[id number] DOB: February 01, 1956 DOA: 04/22/2018  PCP: Patient, No Pcp Per  Patient coming from: Home  I have personally briefly reviewed patient's old medical records in [place]  Chief Complaint: CHF  HPI: [name] is a 62 y.o. female with medical history significant of PAH, "severe" by echo 05/23/17 at [facility] (cant see actual echo read directly), preserved LV function, DM2.  Patient presents to the ED with SOB.  Symptoms onset just after thanksgiving, have been worsening.  On 12/9 saw her cardiologist who thought she might be fluid overloaded so they increased torsemide and added zaroxolyn to regimen.  Now she is still short of breath and feels severely dehydrated so much so that she didn't even take her diuretics today.  SOB worse with even minimal exertion.  She has cough, sputum production, and reports leg swelling.   ED Course: Bicarb 41 up from 28 in Oct, BUN 45, creat 1.2 up from 24 and 0.88 in October.  Sodium 131, Cl 68, initial lactate 3.0, 1.x on repeat.   Review of Systems: As per HPI otherwise 10 point review of systems negative.   Past Medical History:  Diagnosis Date  . ADHD   . Chronic right-sided CHF (congestive heart failure) ([place])   . Depression   . GERD (gastroesophageal reflux disease)   . Migraine   . PAH (pulmonary artery hypertension) ([place])   . Restless leg syndrome     Past Surgical History:  Procedure Laterality Date  . c sections    . CHOLECYSTECTOMY    . TONSILLECTOMY       reports that she has quit smoking. Her smoking use included cigarettes. She smoked 0.50 packs per day. She does not have any smokeless tobacco history on file. She reports that she does not drink alcohol or use drugs.  Allergies  Allergen Reactions  . Asa [Aspirin] Nausea Only    abd pain  . Codeine Nausea Only  . Sulfa Antibiotics Hives    Family History  Problem Relation Age of Onset  . Alcohol abuse Sister     . Drug abuse Sister   . Mental illness Sister   . Early death Sister      Prior to Admission medications   Medication Sig Start Date End Date Taking? Authorizing Provider  Amphetamine-Dextroamphetamine (ADDERALL PO) Take 10 mg by mouth 3 (three) times daily.    Yes [provider]  atorvastatin (LIPITOR) 40 MG tablet Take 40 mg by mouth daily.   Yes [provider]  escitalopram (LEXAPRO) 20 MG tablet Take 20 mg by mouth daily.   Yes [provider]  HYDROcodone-acetaminophen (NORCO/VICODIN) 5-325 MG tablet Take 1 tablet by mouth at bedtime as needed (restless legs).   Yes [provider]  metFORMIN (GLUCOPHAGE) 500 MG tablet Take 500 mg by mouth 2 (two) times daily with a meal.   Yes [provider]  metolazone (ZAROXOLYN) 2.5 MG tablet Take 5 mg by mouth every other day.   Yes [provider]  montelukast (SINGULAIR) 10 MG tablet Take 10 mg by mouth daily.   Yes [provider]  potassium chloride (K-DUR,KLOR-CON) 10 MEQ tablet Take 20 mEq by mouth daily.   Yes [provider]  torsemide (DEMADEX) 100 MG tablet Take 150 mg by mouth daily.   Yes [provider]    Physical Exam: Vitals:   04/22/18 1800 04/22/18 1815 04/22/18 2000 04/22/18 2015  BP: 120/69  110/88 114/69 130/69  Pulse: 99 100 98 96  Resp: 15 19 (!) 22 (!) 30  Temp:      TempSrc:      SpO2: 97% 96% 94% 94%  Weight:      Height:        Constitutional: NAD, calm, comfortable Eyes: PERRL, lids and conjunctivae normal ENMT: Mucous membranes are moist. Posterior pharynx clear of any exudate or lesions.Normal dentition.  Neck: normal, supple, no masses, no thyromegaly Respiratory: diffuse expiratory wheezing Cardiovascular: Regular rate and rhythm, no murmurs / rubs / gallops. Very minimal trace edema on my exam. 2+ pedal pulses. No carotid bruits.  Abdomen: no tenderness, no masses palpated. No hepatosplenomegaly. Bowel sounds positive.   Musculoskeletal: no clubbing / cyanosis. No joint deformity upper and lower extremities. Good ROM, no contractures. Normal muscle tone.  Skin: no rashes, lesions, ulcers. No induration Neurologic: CN 2-12 grossly intact. Sensation intact, DTR normal. Strength 5/5 in all 4.  Psychiatric: Normal judgment and insight. Alert and oriented x 3. Normal mood.    Labs on Admission: I have personally reviewed following labs and imaging studies  CBC: Recent Labs  Lab 04/22/18 1739  WBC 12.4*  NEUTROABS 9.1*  HGB 16.6*  HCT 55.3*  MCV 87.2  PLT 169   Basic Metabolic Panel: Recent Labs  Lab 04/22/18 1739  NA 131*  K 3.3*  CL 68*  CO2 41*  GLUCOSE 136*  BUN 45*  CREATININE 1.27*  CALCIUM 9.7   GFR: Estimated Creatinine Clearance: 59.4 mL/min (A) (by C-G formula based on SCr of 1.27 mg/dL (H)). Liver Function Tests: Recent Labs  Lab 04/22/18 1739  AST 43*  ALT 17  ALKPHOS 88  BILITOT 3.2*  PROT 7.1  ALBUMIN 3.7   Recent Labs  Lab 04/22/18 1739  LIPASE 30   No results for input(s): AMMONIA in the last 168 hours. Coagulation Profile: No results for input(s): INR, PROTIME in the last 168 hours. Cardiac Enzymes: No results for input(s): CKTOTAL, CKMB, CKMBINDEX, TROPONINI in the last 168 hours. BNP (last 3 results) No results for input(s): PROBNP in the last 8760 hours. HbA1C: No results for input(s): HGBA1C in the last 72 hours. CBG: No results for input(s): GLUCAP in the last 168 hours. Lipid Profile: No results for input(s): CHOL, HDL, LDLCALC, TRIG, CHOLHDL, LDLDIRECT in the last 72 hours. Thyroid Function Tests: No results for input(s): TSH, T4TOTAL, FREET4, T3FREE, THYROIDAB in the last 72 hours. Anemia Panel: No results for input(s): VITAMINB12, FOLATE, FERRITIN, TIBC, IRON, RETICCTPCT in the last 72 hours. Urine analysis:    Component Value Date/Time   COLORURINE YELLOW 04/22/2018 1957   APPEARANCEUR CLEAR 04/22/2018 1957   LABSPEC 1.008 04/22/2018 1957    PHURINE 7.0 04/22/2018 1957   GLUCOSEU NEGATIVE 04/22/2018 1957   HGBUR NEGATIVE 04/22/2018 [place] NEGATIVE 04/22/2018 [place] NEGATIVE 04/22/2018 1957   PROTEINUR NEGATIVE 04/22/2018 1957   NITRITE NEGATIVE 04/22/2018 1957   LEUKOCYTESUR SMALL (A) 04/22/2018 1957    Radiological Exams on Admission: Dg Chest Portable 1 View  Result Date: 04/22/2018 CLINICAL DATA:  Shortness of breath EXAM: PORTABLE CHEST 1 VIEW COMPARISON:  01/22/2008 FINDINGS: The lungs are well inflated. There is mild cardiomegaly. There is bibasilar atelectasis. No focal consolidation or pulmonary edema. There is no pleural effusion or pneumothorax. IMPRESSION: Mild cardiomegaly and bibasilar atelectasis. Electronically Signed   By: Ulyses Jarred M.D.   On: 04/22/2018 18:21   Vas [place] Lower Extremity Venous (dvt) (only  [facility])  Result Date: 04/22/2018  Lower Venous Study Indications: Swelling, and Edema.  Performing Technologist: Abram Sander RVS  Examination Guidelines: A complete evaluation includes B-mode imaging, spectral Doppler, color Doppler, and power Doppler as needed of all accessible portions of each vessel. Bilateral testing is considered an integral part of a complete examination. Limited examinations for reoccurring indications may be performed as noted.  Right Venous Findings: +---------+---------------+---------+-----------+----------+-------+          CompressibilityPhasicitySpontaneityPropertiesSummary +---------+---------------+---------+-----------+----------+-------+ CFV      Full           Yes      Yes                          +---------+---------------+---------+-----------+----------+-------+ SFJ      Full                                                 +---------+---------------+---------+-----------+----------+-------+ FV Prox  Full                                                 +---------+---------------+---------+-----------+----------+-------+ FV  Mid   Full                                                 +---------+---------------+---------+-----------+----------+-------+ FV DistalFull                                                 +---------+---------------+---------+-----------+----------+-------+ PFV      Full                                                 +---------+---------------+---------+-----------+----------+-------+ POP      Full           Yes      Yes                          +---------+---------------+---------+-----------+----------+-------+ PTV      Full                                                 +---------+---------------+---------+-----------+----------+-------+ PERO     Full                                                 +---------+---------------+---------+-----------+----------+-------+  Left Venous Findings: +---------+---------------+---------+-----------+----------+--------------+          CompressibilityPhasicitySpontaneityPropertiesSummary        +---------+---------------+---------+-----------+----------+--------------+ CFV      Full  Yes      Yes                                 +---------+---------------+---------+-----------+----------+--------------+ SFJ      Full                                                        +---------+---------------+---------+-----------+----------+--------------+ FV Prox  Full                                                        +---------+---------------+---------+-----------+----------+--------------+ FV Mid   Full                                                        +---------+---------------+---------+-----------+----------+--------------+ FV DistalFull                                                        +---------+---------------+---------+-----------+----------+--------------+ PFV      Full                                                         +---------+---------------+---------+-----------+----------+--------------+ POP      Full           Yes      Yes                                 +---------+---------------+---------+-----------+----------+--------------+ PTV      Full                                                        +---------+---------------+---------+-----------+----------+--------------+ PERO                                                  Not visualized +---------+---------------+---------+-----------+----------+--------------+    Summary: Right: There is no evidence of deep vein thrombosis in the lower extremity. No cystic structure found in the popliteal fossa. Left: There is no evidence of deep vein thrombosis in the lower extremity. No cystic structure found in the popliteal fossa.  *See table(s) above for measurements and observations.    Preliminary     EKG: Independently reviewed.  Assessment/Plan Principal Problem:   Acute respiratory failure with hypoxia (HCC) Active Problems:  Chronic right-sided CHF (congestive heart failure) (HCC)   PAH (pulmonary artery hypertension) (HCC)   Hypochloremic alkalosis   Prerenal azotemia    1. Acute respiratory failure with hypoxia - 1. new O2 requirement 2. Suspect worsening PAH to blame 3. Cont pulse ox 2. PAH - and chronic R sided CHF 1. Tele monitor 2. 2d echo 3. Suspect this causing her symptoms and presentation today 4. Suspect she may need to be put on [facility] specific meds 5. Appreciate cards input 3. Contraction alkalosis and prerenal azotemia - 1. Lab wise patient looks dehydrated 2. Will hold diuretics 3. May wish to give a small amount of fluid but will let cards weigh in first. 4. Repeat BMP in AM  DVT prophylaxis: Lovenox Code Status: Full Family Communication: Husband at bedside Disposition Plan: Home after admit Consults called: Cards Admission status: Admit to inpatient  Severity of Illness: The appropriate patient status  for this patient is INPATIENT. Inpatient status is judged to be reasonable and necessary in order to provide the required intensity of service to ensure the patient's safety. The patient's presenting symptoms, physical exam findings, and initial radiographic and laboratory data in the context of their chronic comorbidities is felt to place them at high risk for further clinical deterioration. Furthermore, it is not anticipated that the patient will be medically stable for discharge from the hospital within 2 midnights of admission. The following factors support the patient status of inpatient.   " The patient's presenting symptoms include DOE, SOB. " The worrisome physical exam findings include O2 sat in the low 70s on RA, new O2 requirement of 3L to maintain sats above 90 at rest (likely more with activity). " The initial radiographic and laboratory data are worrisome because of Contraction alkalosis and mild AKI. " The chronic co-morbidities include PAH, R sided CHF.   * I certify that at the point of admission it is my clinical judgment that the patient will require inpatient hospital care spanning beyond 2 midnights from the point of admission due to high intensity of service, high risk for further deterioration and high frequency of surveillance required.Etta Quill DO [facility] Pager [phone] Only works nights!  If 7AM-7PM, please contact the primary day team physician taking care of patient  [email or web address] Password [email or web address]  04/22/2018, 8:55 PM

## 2018-04-22 NOTE — ED Provider Notes (Signed)
[facility] DEPART[facility] Provider Note   CSN: [id number] Arrival date & time: 04/22/18  1720     History   Chief Complaint Chief Complaint  Patient presents with  . Congestive Heart Failure    HPI [name] is a 62 y.o. female.  The history is provided by the patient and medical records. No language interpreter was used.  Shortness of Breath  This is a new problem. The average episode lasts 2 weeks. The problem occurs continuously.The current episode started more than 1 week ago. The problem has been rapidly worsening. Associated symptoms include cough, sputum production and leg swelling. Pertinent negatives include no fever, no headaches, no rhinorrhea, no neck pain, no wheezing, no chest pain, no vomiting, no abdominal pain and no leg pain. She has tried nothing for the symptoms. Associated medical issues include chronic lung disease and heart failure.    Past Medical History:  Diagnosis Date  . GERD (gastroesophageal reflux disease)   . Migraine   . Restless leg syndrome     There are no active problems to display for this patient.   Past Surgical History:  Procedure Laterality Date  . c sections    . CHOLECYSTECTOMY    . TONSILLECTOMY       OB History   No obstetric history on file.      Home Medications    Prior to Admission medications   Medication Sig Start Date End Date Taking? Authorizing Provider  Amphetamine-Dextroamphetamine (ADDERALL PO) Take by mouth.    [provider]  diphenhydrAMINE (BENADRYL) 25 MG tablet Take 2 tablets (50 mg total) by mouth at bedtime as needed for itching. 04/17/12   Szekalski, Verline Lema, PA-C  HYDROCHLOROTHIAZIDE PO Take by mouth.    [provider]  Hydrocodone-Acetaminophen (VICODIN PO) Take by mouth.    [provider]  metoCLOPramide (REGLAN) 10 MG tablet Take 1 tablet (10 mg total) by mouth every 6 (six) hours. 04/17/12   Alvina Chou, PA-C    Family  History No family history on file.  Social History Social History   Tobacco Use  . Smoking status: Current Every Day Smoker    Packs/day: 0.50    Types: Cigarettes  Substance Use Topics  . Alcohol use: No  . Drug use: No     Allergies   Asa [aspirin]; Codeine; and Sulfa antibiotics   Review of Systems Review of Systems  Constitutional: Positive for fatigue. Negative for chills, diaphoresis and fever.  HENT: Negative for congestion and rhinorrhea.        Cyanosis   Respiratory: Positive for cough, sputum production, chest tightness and shortness of breath. Negative for wheezing.   Cardiovascular: Positive for leg swelling. Negative for chest pain and palpitations.  Gastrointestinal: Negative for abdominal pain, constipation, diarrhea, nausea and vomiting.  Genitourinary: Negative for dysuria and flank pain.  Musculoskeletal: Negative for back pain, neck pain and neck stiffness.  Skin: Negative for wound.  Neurological: Negative for light-headedness and headaches.  Psychiatric/Behavioral: Negative for agitation.  All other systems reviewed and are negative.    Physical Exam Updated Vital Signs BP 112/72 (BP Location: Right Arm)   Pulse (!) 106   Temp 98.8 F (37.1 C) (Oral)   Resp 19   Ht 5\' 7"  (1.702 m)   Wt 109.8 kg   SpO2 (!) 73%   BMI 37.90 kg/m   Physical Exam Vitals signs and nursing note reviewed.  Constitutional:      General: She is  not in acute distress.    Appearance: She is well-developed. She is ill-appearing. She is not diaphoretic.  HENT:     Head: Normocephalic and atraumatic.     Right Ear: External ear normal.     Left Ear: External ear normal.     Nose: Nose normal.     Mouth/Throat:     Pharynx: No oropharyngeal exudate or posterior oropharyngeal erythema.     Comments: Cyanosis of lips on arrival Eyes:     Conjunctiva/sclera: Conjunctivae normal.     Pupils: Pupils are equal, round, and reactive to light.  Neck:      Musculoskeletal: Normal range of motion and neck supple.  Cardiovascular:     Rate and Rhythm: Tachycardia present.     Pulses: Normal pulses.  Pulmonary:     Effort: Tachypnea present. No respiratory distress.     Breath sounds: No stridor. Rales present. No wheezing or rhonchi.  Chest:     Chest wall: No tenderness.  Abdominal:     General: There is no distension.     Tenderness: There is no abdominal tenderness. There is no rebound.  Musculoskeletal:     Right lower leg: Edema present.     Left lower leg: Edema present.  Skin:    General: Skin is warm.     Coloration: Skin is not jaundiced.     Findings: No erythema or rash.  Neurological:     Mental Status: She is alert and oriented to person, place, and time.     Motor: No abnormal muscle tone.     Coordination: Coordination normal.     Deep Tendon Reflexes: Reflexes are normal and symmetric.      ED Treatments / Results  Labs (all labs ordered are listed, but only abnormal results are displayed) Labs Reviewed  CBC WITH DIFFERENTIAL/PLATELET - Abnormal; Notable for the following components:      Result Value   WBC 12.4 (*)    RBC 6.34 (*)    Hemoglobin 16.6 (*)    HCT 55.3 (*)    RDW 20.2 (*)    Neutro Abs 9.1 (*)    Monocytes Absolute 1.1 (*)    All other components within normal limits  COMPREHENSIVE METABOLIC PANEL - Abnormal; Notable for the following components:   Sodium 131 (*)    Potassium 3.3 (*)    Chloride 68 (*)    CO2 41 (*)    Glucose, Bld 136 (*)    BUN 45 (*)    Creatinine, Ser 1.27 (*)    AST 43 (*)    Total Bilirubin 3.2 (*)    GFR calc non Af Amer 46 (*)    GFR calc Af Amer 53 (*)    Anion gap 22 (*)    All other components within normal limits  URINALYSIS, ROUTINE W REFLEX MICROSCOPIC - Abnormal; Notable for the following components:   Leukocytes, UA SMALL (*)    All other components within normal limits  BRAIN NATRIURETIC PEPTIDE - Abnormal; Notable for the following components:   B  Natriuretic Peptide 501.9 (*)    All other components within normal limits  I-STAT CG4 LACTIC ACID, ED - Abnormal; Notable for the following components:   Lactic Acid, Venous 3.25 (*)    All other components within normal limits  CBG MONITORING, ED - Abnormal; Notable for the following components:   Glucose-Capillary 158 (*)    All other components within normal limits  CULTURE, BLOOD (ROUTINE X 2)  CULTURE, BLOOD (ROUTINE X 2)  LIPASE, BLOOD  D-DIMER, QUANTITATIVE (NOT AT [facility])  HIV ANTIBODY (ROUTINE TESTING W REFLEX)  CBC  BASIC METABOLIC PANEL  I-STAT TROPONIN, ED  I-STAT CG4 LACTIC ACID, ED  I-STAT TROPONIN, ED    EKG EKG Interpretation  Date/Time:  Saturday April 22 2018 17:49:27 EST Ventricular Rate:  99 PR Interval:    QRS Duration: 107 QT Interval:  370 QTC Calculation: 475 R Axis:   110 Text Interpretation:  Sinus tachycardia Atrial premature complex Biatrial enlargement Probable RVH w/ secondary repol abnormality Nonspecific T abnormalities, lateral leads When compared to prior, diffuse new t wave inversions.  No STEMI Confirmed by Antony Blackbird [phone]) on 04/22/2018 5:53:49 PM   Radiology Dg Chest Portable 1 View  Result Date: 04/22/2018 CLINICAL DATA:  Shortness of breath EXAM: PORTABLE CHEST 1 VIEW COMPARISON:  01/22/2008 FINDINGS: The lungs are well inflated. There is mild cardiomegaly. There is bibasilar atelectasis. No focal consolidation or pulmonary edema. There is no pleural effusion or pneumothorax. IMPRESSION: Mild cardiomegaly and bibasilar atelectasis. Electronically Signed   By: Ulyses Jarred M.D.   On: 04/22/2018 18:21   Vas [place] Lower Extremity Venous (dvt) (only [facility])  Result Date: 04/22/2018  Lower Venous Study Indications: Swelling, and Edema.  Performing Technologist: Abram Sander RVS  Examination Guidelines: A complete evaluation includes B-mode imaging, spectral Doppler, color Doppler, and power Doppler as needed of all accessible  portions of each vessel. Bilateral testing is considered an integral part of a complete examination. Limited examinations for reoccurring indications may be performed as noted.  Right Venous Findings: +---------+---------------+---------+-----------+----------+-------+          CompressibilityPhasicitySpontaneityPropertiesSummary +---------+---------------+---------+-----------+----------+-------+ CFV      Full           Yes      Yes                          +---------+---------------+---------+-----------+----------+-------+ SFJ      Full                                                 +---------+---------------+---------+-----------+----------+-------+ FV Prox  Full                                                 +---------+---------------+---------+-----------+----------+-------+ FV Mid   Full                                                 +---------+---------------+---------+-----------+----------+-------+ FV DistalFull                                                 +---------+---------------+---------+-----------+----------+-------+ PFV      Full                                                 +---------+---------------+---------+-----------+----------+-------+  POP      Full           Yes      Yes                          +---------+---------------+---------+-----------+----------+-------+ PTV      Full                                                 +---------+---------------+---------+-----------+----------+-------+ PERO     Full                                                 +---------+---------------+---------+-----------+----------+-------+  Left Venous Findings: +---------+---------------+---------+-----------+----------+--------------+          CompressibilityPhasicitySpontaneityPropertiesSummary        +---------+---------------+---------+-----------+----------+--------------+ CFV      Full           Yes      Yes                                  +---------+---------------+---------+-----------+----------+--------------+ SFJ      Full                                                        +---------+---------------+---------+-----------+----------+--------------+ FV Prox  Full                                                        +---------+---------------+---------+-----------+----------+--------------+ FV Mid   Full                                                        +---------+---------------+---------+-----------+----------+--------------+ FV DistalFull                                                        +---------+---------------+---------+-----------+----------+--------------+ PFV      Full                                                        +---------+---------------+---------+-----------+----------+--------------+ POP      Full           Yes      Yes                                 +---------+---------------+---------+-----------+----------+--------------+  PTV      Full                                                        +---------+---------------+---------+-----------+----------+--------------+ PERO                                                  Not visualized +---------+---------------+---------+-----------+----------+--------------+    Summary: Right: There is no evidence of deep vein thrombosis in the lower extremity. No cystic structure found in the popliteal fossa. Left: There is no evidence of deep vein thrombosis in the lower extremity. No cystic structure found in the popliteal fossa.  *See table(s) above for measurements and observations.    Preliminary     Procedures Procedures (including critical care time)  CRITICAL CARE Performed by: Gwenyth Allegra  Total critical care time: 35 minutes Critical care time was exclusive of separately billable procedures and treating other patients. Critical care was necessary to treat or  prevent imminent or life-threatening deterioration. Critical care was time spent personally by me on the following activities: development of treatment plan with patient and/or surrogate as well as nursing, discussions with consultants, evaluation of patient's response to treatment, examination of patient, obtaining history from patient or surrogate, ordering and performing treatments and interventions, ordering and review of laboratory studies, ordering and review of radiographic studies, pulse oximetry and re-evaluation of patient's condition.   Medications Ordered in ED Medications  atorvastatin (LIPITOR) tablet 40 mg (has no administration in time range)  HYDROcodone-acetaminophen (NORCO/VICODIN) 5-325 MG per tablet 1 tablet (has no administration in time range)  escitalopram (LEXAPRO) tablet 20 mg (has no administration in time range)  montelukast (SINGULAIR) tablet 10 mg (has no administration in time range)  insulin aspart (novoLOG) injection 0-9 Units (has no administration in time range)  acetaminophen (TYLENOL) tablet 650 mg (has no administration in time range)    Or  acetaminophen (TYLENOL) suppository 650 mg (has no administration in time range)  ondansetron (ZOFRAN) tablet 4 mg (has no administration in time range)    Or  ondansetron (ZOFRAN) injection 4 mg (has no administration in time range)  enoxaparin (LOVENOX) injection 40 mg (has no administration in time range)  mometasone-formoterol (DULERA) 200-5 MCG/ACT inhaler 1 puff (has no administration in time range)  albuterol (PROVENTIL) (2.5 MG/3ML) 0.083% nebulizer solution 2.5 mg (has no administration in time range)  potassium chloride SA (K-DUR,KLOR-CON) CR tablet 20 mEq (20 mEq Oral Given 04/22/18 2203)     Initial Impression / Assessment and Plan / ED Course  I have reviewed the triage vital signs and the nursing notes.  Pertinent labs & imaging results that were available during my care of the patient were reviewed  by me and considered in my medical decision making (see chart for details).     [name] is a 62 y.o. female past medical history significant for migraines, GERD, and prior congestive heart failure and pulmonary hypertension who presents with 2 weeks of worsening bilateral lower extremity edema, abdominal swelling, and shortness of breath.  Patient reports that they have increased her diuretics and she is currently on both torsemide and metolazone.  She says that her bilateral legs  have been more swollen.  She is having exertional shortness of breath and some chest tightness.  She denies chest pain.  She denies nausea vomiting, urinary symptoms or new GI symptoms.  She does report chronic constipation.  She denies any recent trauma.  Due to the shortness of breath she presents for evaluation.  On arrival, patient is hypoxic with oxygen saturations in the 50s and 60s on room air.  Patient quickly placed on 7 L nasal cannula and had improvements into the mid 90s.  Patient's lungs had no significant wheezing but did have significant crackles in all lung fields.  No murmur.  Abdomen and chest are nontender.  Patient has edema in both her legs with some erythema.  Patient has normal sensation.  Radial pulses are symmetric.  Patient had cyanosis initially when she was hypoxic however her cyanosis resolved on oxygen.  She denies any history of DVT or PE.   Based on her history, I am primarily concerned for fluid overload and CHF exacerbation however, will obtain bilateral lower extremity DVT studies.  We will also obtain a d-dimer.  Portable chest x-ray we obtained and she will have other lab testing.  Due to the new oxygen requirement, anticipate admission.  7:44 PM Patient has been de-escalate to 3 L nasal cannula and is now having oxygen saturations around 92% range.  Patient is breathing comfortably and is no longer cyanotic.  Patient's laboratory testing showed a negative d-dimer.  Her lactic  acid was elevated at 3.25 however troponin was negative.  Patient is a mild leukocytosis of 12.4.  Chest x-ray did not show evidence of pneumonia so we will hold on any antibiotics at this time.  Patient does have a doubling of her creatinine from prior with a creatinine of 1.27.  In the setting of increasing her diuretics and metolazone and torsemide, will defer to admitting team for diuresis of her fluid overload.  X-ray shows some atelectasis but no pneumonia.  Clinically I suspect this fluid overload and BNP is elevated over 500.  DVT studies were negative.  Hospitalist will be called for admission for fluid overload and new hypoxia.  8:03 PM Hospitalist admit patient however they request cardiology be consulted given her CHF history and for recommendations for safe diuresis.  Cardiology consult will be placed.    Final Clinical Impressions(s) / ED Diagnoses   Final diagnoses:  Hypoxia  Shortness of breath  Congestive heart failure, unspecified HF chronicity, unspecified heart failure type [facility])    ED Discharge Orders    None      Clinical Impression: 1. Hypoxia   2. Shortness of breath   3. Congestive heart failure, unspecified HF chronicity, unspecified heart failure type ([place])     Disposition: Admit  This note was prepared with assistance of Dragon voice recognition software. Occasional wrong-word or sound-a-like substitutions may have occurred due to the inherent limitations of voice recognition software.     , Gwenyth Allegra, MD 04/22/18 2204

## 2018-04-22 NOTE — Consult Note (Addendum)
Cardiology Consultation Note    Patient ID: [name], MRN: [id number], DOB/AGE: [id number] y.o. Admit date: 04/22/2018   Date of Consult: 04/22/2018 Primary Physician: Patient, No Pcp Per Primary Cardiologist: [place]  Chief Complaint: leg pain Reason for Consultation: assess volume status Requesting MD: Dr. Jennette Kettle  HPI: [name] is a 62 y.o. female with a history of severe pulmonary hypertension (care outside [place] at [place]), DM2, long smoking history, who presents with dyspnea and leg pain in the setting of recent fluid overload. The patient was seen by her outpatient providers on 12/9 and felt to be fluid overloaded with leg and abdominal edema, prompting the addition of metolozone to her regimen of torsemide 150 mg PO daily. This resulted in significant diuresis and improvement of her leg edema but more leg pain and continued dyspnea with minimal exertion. She also endorses cough, sputum production and ongoing swelling of her legs and particularly her abdomen.   In the ER her labs are consistent with a contraction alkalosis with Cr up to 1.2 from 0.88 and Bicarb at 41 up from 28. Hypokalemia to 3.3. Initial lactate 3 but down to normal on repeat. ECG with RVH, repol abnormalities and prolonged QTc. Troponin negative, BNP 501.9. CXR without pulmonary edema.   Past Medical History:  Diagnosis Date  . ADHD   . Chronic right-sided CHF (congestive heart failure) ([place])   . Depression   . GERD (gastroesophageal reflux disease)   . Migraine   . PAH (pulmonary artery hypertension) ([place])   . Restless leg syndrome       Surgical History:  Past Surgical History:  Procedure Laterality Date  . c sections    . CHOLECYSTECTOMY    . TONSILLECTOMY       Home Meds: Prior to Admission medications   Medication Sig Start Date End Date Taking? Authorizing Provider  Amphetamine-Dextroamphetamine (ADDERALL PO) Take 10 mg by mouth 3 (three) times daily.     Yes [provider]  atorvastatin (LIPITOR) 40 MG tablet Take 40 mg by mouth daily.   Yes [provider]  escitalopram (LEXAPRO) 20 MG tablet Take 20 mg by mouth daily.   Yes [provider]  HYDROcodone-acetaminophen (NORCO/VICODIN) 5-325 MG tablet Take 1 tablet by mouth at bedtime as needed (restless legs).   Yes [provider]  metFORMIN (GLUCOPHAGE) 500 MG tablet Take 500 mg by mouth 2 (two) times daily with a meal.   Yes [provider]  metolazone (ZAROXOLYN) 2.5 MG tablet Take 5 mg by mouth every other day.   Yes [provider]  montelukast (SINGULAIR) 10 MG tablet Take 10 mg by mouth daily.   Yes [provider]  potassium chloride (K-DUR,KLOR-CON) 10 MEQ tablet Take 20 mEq by mouth daily.   Yes [provider]  torsemide (DEMADEX) 100 MG tablet Take 150 mg by mouth daily.   Yes [provider]    Inpatient Medications:  . [START ON 04/23/2018] atorvastatin  40 mg Oral Daily  . [START ON 04/23/2018] enoxaparin (LOVENOX) injection  40 mg Subcutaneous Q24H  . [START ON 04/23/2018] escitalopram  20 mg Oral Daily  . [START ON 04/23/2018] insulin aspart  0-9 Units Subcutaneous TID WC  . mometasone-formoterol  1 puff Inhalation BID  . [START ON 04/23/2018] montelukast  10 mg Oral Daily     Allergies:  Allergies  Allergen Reactions  . Asa [Aspirin] Nausea Only    abd pain  .  Codeine Nausea Only  . Sulfa Antibiotics Hives    Social History   Socioeconomic History  . Marital status: Married    Spouse name: Not on file  . Number of children: Not on file  . Years of education: Not on file  . Highest education level: Not on file  Occupational History  . Not on file  Social Needs  . Financial resource strain: Not on file  . Food insecurity:    Worry: Not on file    Inability: Not on file  . Transportation needs:    Medical: Not on file    Non-medical: Not on file  Tobacco Use  . Smoking  status: Former Smoker    Packs/day: 0.50    Types: Cigarettes  Substance and Sexual Activity  . Alcohol use: No  . Drug use: No  . Sexual activity: Not on file  Lifestyle  . Physical activity:    Days per week: Not on file    Minutes per session: Not on file  . Stress: Not on file  Relationships  . Social connections:    Talks on phone: Not on file    Gets together: Not on file    Attends religious service: Not on file    Active member of club or organization: Not on file    Attends meetings of clubs or organizations: Not on file    Relationship status: Not on file  . Intimate partner violence:    Fear of current or ex partner: Not on file    Emotionally abused: Not on file    Physically abused: Not on file    Forced sexual activity: Not on file  Other Topics Concern  . Not on file  Social History Narrative  . Not on file     Family History  Problem Relation Age of Onset  . Alcohol abuse Sister   . Drug abuse Sister   . Mental illness Sister   . Early death Sister      Review of Systems: All other systems reviewed and are otherwise negative except as noted above.  Labs: No results for input(s): CKTOTAL, CKMB, TROPONINI in the last 72 hours. Lab Results  Component Value Date   WBC 12.4 (H) 04/22/2018   HGB 16.6 (H) 04/22/2018   HCT 55.3 (H) 04/22/2018   MCV 87.2 04/22/2018   PLT 331 04/22/2018    Recent Labs  Lab 04/22/18 1739  NA 131*  K 3.3*  CL 68*  CO2 41*  BUN 45*  CREATININE 1.27*  CALCIUM 9.7  PROT 7.1  BILITOT 3.2*  ALKPHOS 88  ALT 17  AST 43*  GLUCOSE 136*   No results found for: CHOL, HDL, LDLCALC, TRIG Lab Results  Component Value Date   DDIMER 0.38 04/22/2018    Radiology/Studies:  Dg Chest Portable 1 View  Result Date: 04/22/2018 CLINICAL DATA:  Shortness of breath EXAM: PORTABLE CHEST 1 VIEW COMPARISON:  01/22/2008 FINDINGS: The lungs are well inflated. There is mild cardiomegaly. There is bibasilar atelectasis. No focal  consolidation or pulmonary edema. There is no pleural effusion or pneumothorax. IMPRESSION: Mild cardiomegaly and bibasilar atelectasis. Electronically Signed   By: Ulyses Jarred M.D.   On: 04/22/2018 18:21   Vas [place] Lower Extremity Venous (dvt) (only Mc & [facility])  Result Date: 04/22/2018  Lower Venous Study Indications: Swelling, and Edema.  Performing Technologist: Abram Sander RVS  Examination Guidelines: A complete evaluation includes B-mode imaging, spectral Doppler, color Doppler, and power Doppler as  needed of all accessible portions of each vessel. Bilateral testing is considered an integral part of a complete examination. Limited examinations for reoccurring indications may be performed as noted.  Right Venous Findings: +---------+---------------+---------+-----------+----------+-------+          CompressibilityPhasicitySpontaneityPropertiesSummary +---------+---------------+---------+-----------+----------+-------+ CFV      Full           Yes      Yes                          +---------+---------------+---------+-----------+----------+-------+ SFJ      Full                                                 +---------+---------------+---------+-----------+----------+-------+ FV Prox  Full                                                 +---------+---------------+---------+-----------+----------+-------+ FV Mid   Full                                                 +---------+---------------+---------+-----------+----------+-------+ FV DistalFull                                                 +---------+---------------+---------+-----------+----------+-------+ PFV      Full                                                 +---------+---------------+---------+-----------+----------+-------+ POP      Full           Yes      Yes                          +---------+---------------+---------+-----------+----------+-------+ PTV      Full                                                  +---------+---------------+---------+-----------+----------+-------+ PERO     Full                                                 +---------+---------------+---------+-----------+----------+-------+  Left Venous Findings: +---------+---------------+---------+-----------+----------+--------------+          CompressibilityPhasicitySpontaneityPropertiesSummary        +---------+---------------+---------+-----------+----------+--------------+ CFV      Full           Yes      Yes                                 +---------+---------------+---------+-----------+----------+--------------+  SFJ      Full                                                        +---------+---------------+---------+-----------+----------+--------------+ FV Prox  Full                                                        +---------+---------------+---------+-----------+----------+--------------+ FV Mid   Full                                                        +---------+---------------+---------+-----------+----------+--------------+ FV DistalFull                                                        +---------+---------------+---------+-----------+----------+--------------+ PFV      Full                                                        +---------+---------------+---------+-----------+----------+--------------+ POP      Full           Yes      Yes                                 +---------+---------------+---------+-----------+----------+--------------+ PTV      Full                                                        +---------+---------------+---------+-----------+----------+--------------+ PERO                                                  Not visualized +---------+---------------+---------+-----------+----------+--------------+    Summary: Right: There is no evidence of deep vein thrombosis in the lower extremity. No  cystic structure found in the popliteal fossa. Left: There is no evidence of deep vein thrombosis in the lower extremity. No cystic structure found in the popliteal fossa.  *See table(s) above for measurements and observations.    Preliminary     Wt Readings from Last 3 Encounters:  04/22/18 109.8 kg    EKG: RVH, repol abnormalities and prolonged QTc with T wave flattening and inversions possibly related to hypokalemia  Physical Exam: Blood pressure 118/70, pulse 96, temperature 98.8 F (37.1 C), temperature source Oral, resp. rate 19, height 5\' 7"  (1.702 m), weight 109.8 kg, SpO2 94 %.  Body mass index is 37.9 kg/m. General: Well developed, well nourished, on supplemental oxygen. Head: Normocephalic, atraumatic, sclera non-icteric, no xanthomas, nares are without discharge.  Neck: Negative for carotid bruits. JVD difficult to appreciate but does not appear elevated. Lungs: Diffuse expiratory wheezing with prolonged expiratory phase. Heart: RRR with S1 S2. No murmurs, rubs, or gallops appreciated. Abdomen: Soft but distended with abdominal wall edema. Msk:  Strength and tone appear normal for age. Extremities: 1+ edema bilateraly with erythema bilaterally. Neuro: Alert and oriented X 3. No facial asymmetry. No focal deficit. Moves all extremities spontaneously. Psych:  Responds to questions appropriately with a normal affect.     Assessment and Plan  62 y/o F with a history of severe pulmonary hypertension (care outside [facility] at [place]), DM2, long smoking history, who presents with dyspnea and leg pain in the setting of recent fluid overload. She appears to be intravascularly down by her laboratory work and her physical exam, while demonstrating some extravascular residual edema especially at the abdomen.  -Recommend echocardiogram in the AM to assess her right heart and the degree of pulmonary hypertension.  -Agree with holding off on further diuresis this evening given  contraction alkalosis and increase in Cr. Her volume status will be challenging to assess/manage and she may benefit from a right heart catheterization depending on the results of her echocardiogram tomorrow. Cardiology will reassess her volume status tomorrow and leave recommendations for continuing vs. Holding diuretics.  -Her history of recent cough and sputum production in conjunction with her long smoking history and diffuse expiratory wheezes on exam exam are all consistent with a COPD exacerbation. Agree with treatment of COPD exacerbation per the primary team. -Please supplement her potassium aggressively to keep K+>4.0 -Please keep Mg >2.0  Thank you for this interesting consult. The cardiology consult team will see the patient tomorrow.  Signed, Verneita Griffes, MD 04/22/2018, 10:36 PM

## 2018-04-22 NOTE — ED Triage Notes (Signed)
Pt presents to ED with bilateral leg and abdominal swelling starting the Sunday before Thanksgiving. Went to her PCP this week and they increased her diuretic and she has been taking as prescribed.

## 2018-04-23 ENCOUNTER — Encounter (HOSPITAL_COMMUNITY): Payer: Self-pay

## 2018-04-23 ENCOUNTER — Inpatient Hospital Stay (HOSPITAL_COMMUNITY): Payer: 59

## 2018-04-23 DIAGNOSIS — R0602 Shortness of breath: Secondary | ICD-10-CM

## 2018-04-23 DIAGNOSIS — E669 Obesity, unspecified: Secondary | ICD-10-CM

## 2018-04-23 DIAGNOSIS — I272 Pulmonary hypertension, unspecified: Secondary | ICD-10-CM

## 2018-04-23 LAB — BLOOD CULTURE ID PANEL (REFLEXED)
Acinetobacter baumannii: NOT DETECTED
CANDIDA KRUSEI: NOT DETECTED
Candida albicans: NOT DETECTED
Candida glabrata: NOT DETECTED
Candida parapsilosis: NOT DETECTED
Candida tropicalis: NOT DETECTED
ESCHERICHIA COLI: NOT DETECTED
Enterobacter cloacae complex: NOT DETECTED
Enterobacteriaceae species: NOT DETECTED
Enterococcus species: NOT DETECTED
Haemophilus influenzae: NOT DETECTED
Klebsiella oxytoca: NOT DETECTED
Klebsiella pneumoniae: NOT DETECTED
Listeria monocytogenes: NOT DETECTED
Methicillin resistance: NOT DETECTED
Neisseria meningitidis: NOT DETECTED
Proteus species: NOT DETECTED
Pseudomonas aeruginosa: NOT DETECTED
STREPTOCOCCUS AGALACTIAE: NOT DETECTED
Serratia marcescens: NOT DETECTED
Staphylococcus aureus (BCID): NOT DETECTED
Staphylococcus species: DETECTED — AB
Streptococcus pneumoniae: NOT DETECTED
Streptococcus pyogenes: NOT DETECTED
Streptococcus species: NOT DETECTED

## 2018-04-23 LAB — BASIC METABOLIC PANEL
Anion gap: 18 — ABNORMAL HIGH (ref 5–15)
BUN: 40 mg/dL — ABNORMAL HIGH (ref 8–23)
CO2: 44 mmol/L — AB (ref 22–32)
Calcium: 9.1 mg/dL (ref 8.9–10.3)
Chloride: 69 mmol/L — ABNORMAL LOW (ref 98–111)
Creatinine, Ser: 1.12 mg/dL — ABNORMAL HIGH (ref 0.44–1.00)
GFR calc non Af Amer: 53 mL/min — ABNORMAL LOW (ref >60)
Glucose, Bld: 121 mg/dL — ABNORMAL HIGH (ref 70–99)
Potassium: 4.1 mmol/L (ref 3.5–5.1)
Sodium: 131 mmol/L — ABNORMAL LOW (ref 135–145)

## 2018-04-23 LAB — GLUCOSE, CAPILLARY
Glucose-Capillary: 123 mg/dL — ABNORMAL HIGH (ref 70–99)
Glucose-Capillary: 142 mg/dL — ABNORMAL HIGH (ref 70–99)

## 2018-04-23 LAB — ECHOCARDIOGRAM COMPLETE
Height: 67 in
Weight: 3647.29 oz

## 2018-04-23 LAB — HIV ANTIBODY (ROUTINE TESTING W REFLEX): HIV Screen 4th Generation wRfx: NONREACTIVE

## 2018-04-23 LAB — CBC
HEMATOCRIT: 53.2 % — AB (ref 36.0–46.0)
Hemoglobin: 15.7 g/dL — ABNORMAL HIGH (ref 12.0–15.0)
MCH: 25.7 pg — ABNORMAL LOW (ref 26.0–34.0)
MCHC: 29.5 g/dL — ABNORMAL LOW (ref 30.0–36.0)
MCV: 87.2 fL (ref 80.0–100.0)
Platelets: 344 10*3/uL (ref 150–400)
RBC: 6.1 MIL/uL — ABNORMAL HIGH (ref 3.87–5.11)
RDW: 19.9 % — ABNORMAL HIGH (ref 11.5–15.5)
WBC: 12.2 10*3/uL — ABNORMAL HIGH (ref 4.0–10.5)
nRBC: 0 % (ref 0.0–0.2)

## 2018-04-23 LAB — D-DIMER, QUANTITATIVE: D-Dimer, Quant: 0.38 ug/mL-FEU (ref 0.00–0.50)

## 2018-04-23 MED ORDER — IBUPROFEN 200 MG PO TABS
400.0000 mg | ORAL_TABLET | Freq: Once | ORAL | Status: AC
  Administered 2018-04-23: 400 mg via ORAL
  Filled 2018-04-23: qty 2

## 2018-04-23 MED ORDER — POLYETHYLENE GLYCOL 3350 17 G PO PACK
17.0000 g | PACK | Freq: Every day | ORAL | Status: DC | PRN
  Administered 2018-04-23 – 2018-04-24 (×2): 17 g via ORAL
  Filled 2018-04-23 (×2): qty 1

## 2018-04-23 MED ORDER — HYDROCODONE-ACETAMINOPHEN 5-325 MG PO TABS
1.0000 | ORAL_TABLET | Freq: Once | ORAL | Status: AC
  Administered 2018-04-23: 1 via ORAL
  Filled 2018-04-23: qty 1

## 2018-04-23 MED ORDER — PERFLUTREN LIPID MICROSPHERE
1.0000 mL | INTRAVENOUS | Status: AC | PRN
  Administered 2018-04-23: 5 mL via INTRAVENOUS
  Filled 2018-04-23: qty 10

## 2018-04-23 MED ORDER — PREDNISONE 20 MG PO TABS: 50.0000 mg | ORAL_TABLET | Freq: Every day | ORAL | Status: DC

## 2018-04-23 MED ORDER — METHYLPREDNISOLONE SODIUM SUCC 40 MG IJ SOLR
40.0000 mg | Freq: Two times a day (BID) | INTRAMUSCULAR | Status: DC
  Administered 2018-04-23 – 2018-04-24 (×3): 40 mg via INTRAVENOUS
  Filled 2018-04-23 (×3): qty 1

## 2018-04-23 MED ORDER — IPRATROPIUM-ALBUTEROL 0.5-2.5 (3) MG/3ML IN SOLN
3.0000 mL | Freq: Four times a day (QID) | RESPIRATORY_TRACT | Status: DC
  Administered 2018-04-23: 3 mL via RESPIRATORY_TRACT
  Filled 2018-04-23 (×2): qty 3

## 2018-04-23 MED ORDER — IPRATROPIUM-ALBUTEROL 0.5-2.5 (3) MG/3ML IN SOLN
3.0000 mL | Freq: Three times a day (TID) | RESPIRATORY_TRACT | Status: DC
  Administered 2018-04-24 – 2018-04-27 (×11): 3 mL via RESPIRATORY_TRACT
  Filled 2018-04-23 (×10): qty 3

## 2018-04-23 MED ORDER — INSULIN ASPART 100 UNIT/ML ~~LOC~~ SOLN
0.0000 [IU] | Freq: Three times a day (TID) | SUBCUTANEOUS | Status: DC
  Administered 2018-04-24: 3 [IU] via SUBCUTANEOUS
  Administered 2018-04-25: 5 [IU] via SUBCUTANEOUS
  Administered 2018-04-25: 3 [IU] via SUBCUTANEOUS
  Administered 2018-04-25: 5 [IU] via SUBCUTANEOUS
  Administered 2018-04-26: 3 [IU] via SUBCUTANEOUS
  Administered 2018-04-26: 15 [IU] via SUBCUTANEOUS
  Administered 2018-04-26: 3 [IU] via SUBCUTANEOUS
  Administered 2018-04-27: 5 [IU] via SUBCUTANEOUS

## 2018-04-23 MED ORDER — TORSEMIDE 20 MG PO TABS
60.0000 mg | ORAL_TABLET | Freq: Two times a day (BID) | ORAL | Status: DC
  Administered 2018-04-23 – 2018-04-27 (×8): 60 mg via ORAL
  Filled 2018-04-23 (×8): qty 3

## 2018-04-23 MED ORDER — INSULIN ASPART 100 UNIT/ML ~~LOC~~ SOLN
0.0000 [IU] | Freq: Every day | SUBCUTANEOUS | Status: DC
  Administered 2018-04-23 – 2018-04-24 (×2): 2 [IU] via SUBCUTANEOUS
  Administered 2018-04-25: 4 [IU] via SUBCUTANEOUS

## 2018-04-23 NOTE — Progress Notes (Signed)
PROGRESS NOTE    ALUNA WHISTON  YWV:[id number] DOB: May 21, 1955 DOA: 04/22/2018 PCP: Patient, No Pcp Per  Brief Narrative: 62 year old female with history of severe pulmonary hypertension, diabetes type 2, COPD, 40+-pack-year history of smoking followed by cardiology at [facility] on torsemide 160 mg daily was recently seen by her outpatient providers on 12/9, felt to be fluid overloaded, metolazone was added to her medication regimen, reports diuresing well and significant improvement in her swelling since then. -She presented to our ED with productive cough, congestion, shortness of breath -Chest x-ray was unremarkable except for atelectasis at the bases, she was felt to be over diuresed and diuretics were held yesterday  Assessment & Plan:   Principal Problem:   Acute respiratory failure with hypoxia ([place]) -Suspect bronchitis/COPD exacerbation in the background of advanced COPD, severe pulmonary hypertension and right-sided heart failure -Last echocardiogram was on 05/2017 outside hospital, unable to review this -Repeat echo ordered will follow -Continue nebs, some wheezing noted, add low-dose IV steroids  Chronic right-sided heart failure/severe PAH -I suspect this is secondary to COPD, OSA OHS -She was unable to tolerate a sleep study in the past recommended sleep study as outpatient -Clinically appears close to euvolemic at this time, may also have chronic venous stasis which accounts for some degree of her lower extremity edema -Would err on the side of restarting her diuretics perhaps at a lower dose await cardiology input regarding this -Await echocardiogram to assess PA pressures and RV, EF  Contraction alkalosis -Has chronically elevated CO2 at baseline due to compensation for chronic respiratory acidosis -Does not appear dry to me at this time, await cards input  Diabetes mellitus with hyperglycemia -Check hemoglobin A1c -Sliding scale insulin changed to moderate  coverage -Metformin on hold  DVT prophylaxis: Lovenox Code Status: Full code Family Communication: Spouse at bedside Disposition Plan: Home pending clinical improvement  Consultants:   Cards   Procedures:   Antimicrobials:    Subjective: -Continues to complain of cough and congestion   Objective: Vitals:   04/23/18 0200 04/23/18 0321 04/23/18 0322 04/23/18 0734  BP: 117/71  (!) 115/59 (!) 115/55  Pulse: 97  92 90  Resp: 16  20 18   Temp:   98.8 F (37.1 C) 98.7 F (37.1 C)  TempSrc:   Oral Oral  SpO2: 93%  96% 94%  Weight:  103.4 kg    Height:  5\' 7"  (1.702 m)     No intake or output data in the 24 hours ending 04/23/18 1008 Filed Weights   04/22/18 1730 04/23/18 0321  Weight: 109.8 kg 103.4 kg    Examination:  Gen: Awake, Alert, Oriented X 3, is, chronically ill-appearing female, sitting up in bed, no distress HEENT: PERRLA, Neck supple, no JVD Lungs: Poor air movement bilaterally, scattered rhonchi and expiratory wheezes CVS: RRR,No Gallops,Rubs or new Murmurs Abd: soft, Non tender, non distended, BS present Extremities: 1+ edema with chronic venous stasis changes Skin: no new rashes Psychiatry: Judgement and insight appear normal. Mood & affect appropriate.     Data Reviewed:   CBC: Recent Labs  Lab 04/22/18 1739 04/23/18 0243  WBC 12.4* 12.2*  NEUTROABS 9.1*  --   HGB 16.6* 15.7*  HCT 55.3* 53.2*  MCV 87.2 87.2  PLT 331 854   Basic Metabolic Panel: Recent Labs  Lab 04/22/18 1739 04/23/18 0243  NA 131* 131*  K 3.3* 4.1  CL 68* 69*  CO2 41* 44*  GLUCOSE 136* 121*  BUN 45* 40*  CREATININE 1.27*  1.12*  CALCIUM 9.7 9.1   GFR: Estimated Creatinine Clearance: 65.2 mL/min (A) (by C-G formula based on SCr of 1.12 mg/dL (H)). Liver Function Tests: Recent Labs  Lab 04/22/18 1739  AST 43*  ALT 17  ALKPHOS 88  BILITOT 3.2*  PROT 7.1  ALBUMIN 3.7   Recent Labs  Lab 04/22/18 1739  LIPASE 30   No results for input(s): AMMONIA in  the last 168 hours. Coagulation Profile: No results for input(s): INR, PROTIME in the last 168 hours. Cardiac Enzymes: No results for input(s): CKTOTAL, CKMB, CKMBINDEX, TROPONINI in the last 168 hours. BNP (last 3 results) No results for input(s): PROBNP in the last 8760 hours. HbA1C: No results for input(s): HGBA1C in the last 72 hours. CBG: Recent Labs  Lab 04/22/18 2203 04/23/18 0754  GLUCAP 158* 123*   Lipid Profile: No results for input(s): CHOL, HDL, LDLCALC, TRIG, CHOLHDL, LDLDIRECT in the last 72 hours. Thyroid Function Tests: No results for input(s): TSH, T4TOTAL, FREET4, T3FREE, THYROIDAB in the last 72 hours. Anemia Panel: No results for input(s): VITAMINB12, FOLATE, FERRITIN, TIBC, IRON, RETICCTPCT in the last 72 hours. Urine analysis:    Component Value Date/Time   COLORURINE YELLOW 04/22/2018 1957   APPEARANCEUR CLEAR 04/22/2018 1957   LABSPEC 1.008 04/22/2018 1957   PHURINE 7.0 04/22/2018 1957   GLUCOSEU NEGATIVE 04/22/2018 1957   HGBUR NEGATIVE 04/22/2018 [place] NEGATIVE 04/22/2018 [place] NEGATIVE 04/22/2018 1957   PROTEINUR NEGATIVE 04/22/2018 1957   NITRITE NEGATIVE 04/22/2018 1957   LEUKOCYTESUR SMALL (A) 04/22/2018 1957   Sepsis Labs: @LABRCNTIP (procalcitonin:4,lacticidven:4)  )No results found for this or any previous visit (from the past 240 hour(s)).       Radiology Studies: Dg Chest Portable 1 View  Result Date: 04/22/2018 CLINICAL DATA:  Shortness of breath EXAM: PORTABLE CHEST 1 VIEW COMPARISON:  01/22/2008 FINDINGS: The lungs are well inflated. There is mild cardiomegaly. There is bibasilar atelectasis. No focal consolidation or pulmonary edema. There is no pleural effusion or pneumothorax. IMPRESSION: Mild cardiomegaly and bibasilar atelectasis. Electronically Signed   By: Ulyses Jarred M.D.   On: 04/22/2018 18:21   Vas [place] Lower Extremity Venous (dvt) (only Mc & [facility]l)  Result Date: 04/23/2018  Lower Venous Study  Indications: Swelling, and Edema.  Performing Technologist: Abram Sander RVS  Examination Guidelines: A complete evaluation includes B-mode imaging, spectral Doppler, color Doppler, and power Doppler as needed of all accessible portions of each vessel. Bilateral testing is considered an integral part of a complete examination. Limited examinations for reoccurring indications may be performed as noted.  Right Venous Findings: +---------+---------------+---------+-----------+----------+-------+          CompressibilityPhasicitySpontaneityPropertiesSummary +---------+---------------+---------+-----------+----------+-------+ CFV      Full           Yes      Yes                          +---------+---------------+---------+-----------+----------+-------+ SFJ      Full                                                 +---------+---------------+---------+-----------+----------+-------+ FV Prox  Full                                                 +---------+---------------+---------+-----------+----------+-------+  FV Mid   Full                                                 +---------+---------------+---------+-----------+----------+-------+ FV DistalFull                                                 +---------+---------------+---------+-----------+----------+-------+ PFV      Full                                                 +---------+---------------+---------+-----------+----------+-------+ POP      Full           Yes      Yes                          +---------+---------------+---------+-----------+----------+-------+ PTV      Full                                                 +---------+---------------+---------+-----------+----------+-------+ PERO     Full                                                 +---------+---------------+---------+-----------+----------+-------+  Left Venous Findings:  +---------+---------------+---------+-----------+----------+--------------+          CompressibilityPhasicitySpontaneityPropertiesSummary        +---------+---------------+---------+-----------+----------+--------------+ CFV      Full           Yes      Yes                                 +---------+---------------+---------+-----------+----------+--------------+ SFJ      Full                                                        +---------+---------------+---------+-----------+----------+--------------+ FV Prox  Full                                                        +---------+---------------+---------+-----------+----------+--------------+ FV Mid   Full                                                        +---------+---------------+---------+-----------+----------+--------------+ FV DistalFull                                                        +---------+---------------+---------+-----------+----------+--------------+  PFV      Full                                                        +---------+---------------+---------+-----------+----------+--------------+ POP      Full           Yes      Yes                                 +---------+---------------+---------+-----------+----------+--------------+ PTV      Full                                                        +---------+---------------+---------+-----------+----------+--------------+ PERO                                                  Not visualized +---------+---------------+---------+-----------+----------+--------------+    Summary: Right: There is no evidence of deep vein thrombosis in the lower extremity. No cystic structure found in the popliteal fossa. Left: There is no evidence of deep vein thrombosis in the lower extremity. No cystic structure found in the popliteal fossa.  *See table(s) above for measurements and observations. Electronically signed by  Deitra Mayo MD on 04/23/2018 at 7:07:49 AM.    Final         Scheduled Meds: . atorvastatin  40 mg Oral Daily  . enoxaparin (LOVENOX) injection  40 mg Subcutaneous Q24H  . escitalopram  20 mg Oral Daily  . insulin aspart  0-9 Units Subcutaneous TID WC  . mometasone-formoterol  1 puff Inhalation BID  . montelukast  10 mg Oral Daily   Continuous Infusions:   LOS: 1 day    Time spent: 33min    Domenic Polite, MD [facility] Page via [email or web address], password [email or web address] After 7PM please contact night-coverage  04/23/2018, 10:08 AM

## 2018-04-23 NOTE — Progress Notes (Signed)
PHARMACY - PHYSICIAN COMMUNICATION CRITICAL VALUE ALERT - BLOOD CULTURE IDENTIFICATION (BCID)  [name] is an 62 y.o. female who presented to [facility] on 04/22/2018 with a chief complaint of SOB, worsening fluid overload. Routine BCx drawn on admission, 1/2 now growing GPC with BCID of CoNS. Patient is AF and otherwise non-infectious appearing.   Name of physician (or Provider) Contacted: Dr. Broadus John  Current antibiotics: None  Changes to prescribed antibiotics recommended:  None - likely represents contamination  Results for orders placed or performed during the hospital encounter of 04/22/18  Blood Culture ID Panel (Reflexed) (Collected: 04/22/2018  6:48 PM)  Result Value Ref Range   Enterococcus species NOT DETECTED NOT DETECTED   Listeria monocytogenes NOT DETECTED NOT DETECTED   Staphylococcus species DETECTED (A) NOT DETECTED   Staphylococcus aureus (BCID) NOT DETECTED NOT DETECTED   Methicillin resistance NOT DETECTED NOT DETECTED   Streptococcus species NOT DETECTED NOT DETECTED   Streptococcus agalactiae NOT DETECTED NOT DETECTED   Streptococcus pneumoniae NOT DETECTED NOT DETECTED   Streptococcus pyogenes NOT DETECTED NOT DETECTED   Acinetobacter baumannii NOT DETECTED NOT DETECTED   Enterobacteriaceae species NOT DETECTED NOT DETECTED   Enterobacter cloacae complex NOT DETECTED NOT DETECTED   Escherichia coli NOT DETECTED NOT DETECTED   Klebsiella oxytoca NOT DETECTED NOT DETECTED   Klebsiella pneumoniae NOT DETECTED NOT DETECTED   Proteus species NOT DETECTED NOT DETECTED   Serratia marcescens NOT DETECTED NOT DETECTED   Haemophilus influenzae NOT DETECTED NOT DETECTED   Neisseria meningitidis NOT DETECTED NOT DETECTED   Pseudomonas aeruginosa NOT DETECTED NOT DETECTED   Candida albicans NOT DETECTED NOT DETECTED   Candida glabrata NOT DETECTED NOT DETECTED   Candida krusei NOT DETECTED NOT DETECTED   Candida parapsilosis NOT DETECTED NOT DETECTED   Candida  tropicalis NOT DETECTED NOT DETECTED   Anwen Cannedy N. Gerarda Fraction, PharmD, [place] PGY2 Infectious Diseases Pharmacy Resident Phone: [phone] 04/23/2018  5:00 PM

## 2018-04-23 NOTE — Progress Notes (Signed)
   04/23/18 1037  Clinical Encounter Type  Visited With Patient and family together  Visit Type Initial;Other (Comment) (AD)  Referral From Nurse  Consult/Referral To Chaplain  The chaplain responded to spiritual care request for AD.  The Pt. was sleeping at time of chaplain visit.  The chaplain briefly shared the purpose of AD with Pt. husband. The Pt. husband was not aware of the Pt. interest. The chaplain left the AD educational document with the Pt. husband.  The chaplain will F/U with spiritual care visit.

## 2018-04-23 NOTE — Progress Notes (Signed)
Echocardiogram 2D Echocardiogram has been performed with Definity.  [name] 04/23/2018, 2:40 PM

## 2018-04-23 NOTE — Evaluation (Signed)
Physical Therapy Evaluation Patient Details Name: [name] MRN: [id number] DOB: 10/10/1955 Today's Date: 04/23/2018   History of Present Illness  62 y.o. female admitted with acute respiratory failure with hypoxia thought to be due to bronchitis/COPD exacerbation in setting of advanced COPD, pulmonary HTN, and R sided HF.     Clinical Impression  Pt admitted with above diagnosis. Pt currently with functional limitations due to the deficits listed below (see PT Problem List). PTA, pt reports living at home with husband, independent with all mobility, ho home O2 or use of AD for ambulation. Today patient reports feeling fatigued and having BLE leg pain. Ambulated to bathroom with contact guard and UE support on counter, but denies wishing to walk in hallway for further assessment. On 3L SpO2 at 90% after trip to bathroom, HR 105. Will continue assessment next session. Pt will benefit from skilled PT to increase their independence and safety with mobility to allow discharge to the venue listed below.       Follow Up Recommendations Home health PT;Supervision for mobility/OOB    Equipment Recommendations  (TBD)    Recommendations for Other Services       Precautions / Restrictions Precautions Precaution Comments: Watch O2 Restrictions Weight Bearing Restrictions: No      Mobility  Bed Mobility Overal bed mobility: Independent                Transfers Overall transfer level: Modified independent                  Ambulation/Gait Ambulation/Gait assistance: Supervision;Min guard Gait Distance (Feet): 15 Feet Assistive device: None;1 person hand held assist Gait Pattern/deviations: Step-to pattern;Step-through pattern Gait velocity: decreased   General Gait Details: pt with unsteady gait utilizing UE support to ambulate to bathroom. she states she is exhausted and her legs hurt. denies wishing to walk in hallway requests to return to bed.  Stairs             Wheelchair Mobility    Modified Rankin (Stroke Patients Only)       Balance Overall balance assessment: Needs assistance   Sitting balance-Leahy Scale: Good       Standing balance-Leahy Scale: Fair                               Pertinent Vitals/Pain      Home Living Family/patient expects to be discharged to:: Private residence Living Arrangements: Spouse/significant other;Children Available Help at Discharge: Family;Available 24 hours/day Type of Home: House Home Access: Level entry     Home Layout: Multi-level;Other (Comment)([place] states she no longer does stairs) Home Equipment: None      Prior Function Level of Independence: Independent         Comments: pt reporting she is independent no AD or home O2, denies falls     Hand Dominance        Extremity/Trunk Assessment   Upper Extremity Assessment Upper Extremity Assessment: Defer to OT evaluation    Lower Extremity Assessment Lower Extremity Assessment: (unable to fully assess due to pain )       Communication   Communication: No difficulties  Cognition Arousal/Alertness: Awake/alert                                            General Comments  Exercises     Assessment/Plan    PT Assessment Patient needs continued PT services  PT Problem List Decreased activity tolerance       PT Treatment Interventions DME instruction;Gait training;Stair training;Functional mobility training;Therapeutic activities;Therapeutic exercise;Balance training    PT Goals (Current goals can be found in the Care Plan section)  Acute Rehab PT Goals Patient Stated Goal: to rest PT Goal Formulation: With patient Time For Goal Achievement: 05/07/18 Potential to Achieve Goals: Fair    Frequency Min 3X/week   Barriers to discharge        Co-evaluation               AM-PAC PT "6 Clicks" Mobility  Outcome Measure Help needed turning from your back to your  side while in a flat bed without using bedrails?: A Little Help needed moving from lying on your back to sitting on the side of a flat bed without using bedrails?: A Little Help needed moving to and from a bed to a chair (including a wheelchair)?: A Little Help needed standing up from a chair using your arms (e.g., wheelchair or bedside chair)?: A Little Help needed to walk in hospital room?: A Little Help needed climbing 3-5 steps with a railing? : A Little 6 Click Score: 18    End of Session Equipment Utilized During Treatment: Gait belt Activity Tolerance: Patient limited by fatigue Patient left: in bed;with call bell/phone within reach;with family/visitor present Nurse Communication: Mobility status PT Visit Diagnosis: Unsteadiness on feet (R26.81)    Time: 1050-1106 PT Time Calculation (min) (ACUTE ONLY): 16 min   Charges:   PT Evaluation $PT Eval Moderate Complexity: 1 Mod         Reinaldo Berber, PT, DPT Acute Rehabilitation Services Pager: [phone] Office: [phone]    Reinaldo Berber 04/23/2018, 11:39 AM

## 2018-04-23 NOTE — Progress Notes (Signed)
Progress Note  Patient Name: [name] Date of Encounter: 04/23/2018  Primary Cardiologist: [facility] [place] - Dr. Truett Mainland  Subjective   Leg discomfort, no shortness of breath at rest or chest pain.  She has shortness of breath with activity that is chronic.  Inpatient Medications    Scheduled Meds: . atorvastatin  40 mg Oral Daily  . enoxaparin (LOVENOX) injection  40 mg Subcutaneous Q24H  . escitalopram  20 mg Oral Daily  . insulin aspart  0-15 Units Subcutaneous TID WC  . insulin aspart  0-5 Units Subcutaneous QHS  . ipratropium-albuterol  3 mL Nebulization Q6H  . methylPREDNISolone (SOLU-MEDROL) injection  40 mg Intravenous Q12H  . mometasone-formoterol  1 puff Inhalation BID  . montelukast  10 mg Oral Daily    PRN Meds: acetaminophen **OR** acetaminophen, albuterol, HYDROcodone-acetaminophen, ondansetron **OR** ondansetron (ZOFRAN) IV, zolpidem   Vital Signs    Vitals:   04/23/18 0200 04/23/18 0321 04/23/18 0322 04/23/18 0734  BP: 117/71  (!) 115/59 (!) 115/55  Pulse: 97  92 90  Resp: 16  20 18   Temp:   98.8 F (37.1 C) 98.7 F (37.1 C)  TempSrc:   Oral Oral  SpO2: 93%  96% 94%  Weight:  103.4 kg    Height:  5\' 7"  (1.702 m)     No intake or output data in the 24 hours ending 04/23/18 1256 Filed Weights   04/22/18 1730 04/23/18 0321  Weight: 109.8 kg 103.4 kg    Telemetry    Sinus rhythm.  Personally reviewed.  ECG    Tracing from 04/22/2018 showed sinus tachycardia with possible biatrial enlargement and nonspecific T wave changes.  Personally reviewed.  Physical Exam   GEN:  Morbidly obese.  No acute distress.   Neck:  Apical to assess JVP. Cardiac: RRR, no gallop.  Respiratory: Nonlabored.  No crackles. GI:  Obese, nontender, bowel sounds present. MS:  Chronic appearing leg edema; No deformity. Neuro:  Nonfocal. Psych: Alert and oriented x 3. Normal affect.  Labs    Chemistry Recent Labs  Lab 04/22/18 1739  04/23/18 0243  NA 131* 131*  K 3.3* 4.1  CL 68* 69*  CO2 41* 44*  GLUCOSE 136* 121*  BUN 45* 40*  CREATININE 1.27* 1.12*  CALCIUM 9.7 9.1  PROT 7.1  --   ALBUMIN 3.7  --   AST 43*  --   ALT 17  --   ALKPHOS 88  --   BILITOT 3.2*  --   GFRNONAA 46* 53*  GFRAA 53* >60  ANIONGAP 22* 18*     Hematology Recent Labs  Lab 04/22/18 1739 04/23/18 0243  WBC 12.4* 12.2*  RBC 6.34* 6.10*  HGB 16.6* 15.7*  HCT 55.3* 53.2*  MCV 87.2 87.2  MCH 26.2 25.7*  MCHC 30.0 29.5*  RDW 20.2* 19.9*  PLT 331 344    Cardiac EnzymesNo results for input(s): TROPONINI in the last 168 hours.  Recent Labs  Lab 04/22/18 1757 04/22/18 2114  TROPIPOC 0.05 0.04     BNP Recent Labs  Lab 04/22/18 1739  BNP 501.9*     DDimer  Recent Labs  Lab 04/22/18 1901  DDIMER 0.38     Radiology    Dg Chest Portable 1 View  Result Date: 04/22/2018 CLINICAL DATA:  Shortness of breath EXAM: PORTABLE CHEST 1 VIEW COMPARISON:  01/22/2008 FINDINGS: The lungs are well inflated. There is mild cardiomegaly. There is bibasilar atelectasis. No focal consolidation or pulmonary edema.  There is no pleural effusion or pneumothorax. IMPRESSION: Mild cardiomegaly and bibasilar atelectasis. Electronically Signed   By: Ulyses Jarred M.D.   On: 04/22/2018 18:21   Vas [place] Lower Extremity Venous (dvt) (only [facility])  Result Date: 04/23/2018  Lower Venous Study Indications: Swelling, and Edema.  Performing Technologist: Abram Sander RVS  Examination Guidelines: A complete evaluation includes B-mode imaging, spectral Doppler, color Doppler, and power Doppler as needed of all accessible portions of each vessel. Bilateral testing is considered an integral part of a complete examination. Limited examinations for reoccurring indications may be performed as noted.  Right Venous Findings: +---------+---------------+---------+-----------+----------+-------+          CompressibilityPhasicitySpontaneityPropertiesSummary  +---------+---------------+---------+-----------+----------+-------+ CFV      Full           Yes      Yes                          +---------+---------------+---------+-----------+----------+-------+ SFJ      Full                                                 +---------+---------------+---------+-----------+----------+-------+ FV Prox  Full                                                 +---------+---------------+---------+-----------+----------+-------+ FV Mid   Full                                                 +---------+---------------+---------+-----------+----------+-------+ FV DistalFull                                                 +---------+---------------+---------+-----------+----------+-------+ PFV      Full                                                 +---------+---------------+---------+-----------+----------+-------+ POP      Full           Yes      Yes                          +---------+---------------+---------+-----------+----------+-------+ PTV      Full                                                 +---------+---------------+---------+-----------+----------+-------+ PERO     Full                                                 +---------+---------------+---------+-----------+----------+-------+  Left Venous Findings: +---------+---------------+---------+-----------+----------+--------------+          CompressibilityPhasicitySpontaneityPropertiesSummary        +---------+---------------+---------+-----------+----------+--------------+ CFV      Full           Yes      Yes                                 +---------+---------------+---------+-----------+----------+--------------+ SFJ      Full                                                        +---------+---------------+---------+-----------+----------+--------------+ FV Prox  Full                                                         +---------+---------------+---------+-----------+----------+--------------+ FV Mid   Full                                                        +---------+---------------+---------+-----------+----------+--------------+ FV DistalFull                                                        +---------+---------------+---------+-----------+----------+--------------+ PFV      Full                                                        +---------+---------------+---------+-----------+----------+--------------+ POP      Full           Yes      Yes                                 +---------+---------------+---------+-----------+----------+--------------+ PTV      Full                                                        +---------+---------------+---------+-----------+----------+--------------+ PERO                                                  Not visualized +---------+---------------+---------+-----------+----------+--------------+    Summary: Right: There is no evidence of deep vein thrombosis in the lower extremity. No cystic structure found in the popliteal fossa. Left: There is no evidence of deep vein thrombosis in the lower extremity. No cystic structure found  in the popliteal fossa.  *See table(s) above for measurements and observations. Electronically signed by Deitra Mayo MD on 04/23/2018 at 7:07:49 AM.    Final     Cardiac Studies   VQ scan 05/23/2017 ([facility]); TECHNIQUE: The patient was administered 40 mCi technetium DTPA and 8 mCi technetium MAA for ventilation perfusion lung scanning. Imaging was performed in multiple projections. No prior studies.  FINDINGS: Mildly decreased ventilation and perfusion in the upper lobes. No ventilation/perfusion mismatch to suggest pulmonary embolus.  IMPRESSION: Low probability of pulmonary embolus.   Echocardiogram 05/26/2015 ([facility]): The left ventricle is normal in size. There is mild concentric left  ventricular hypertrophy. The left ventricular apex is not well visualized. The left ventricular ejection fraction is normal (60-65%). The right ventricular ejection fraction is normal. There is mild (1+) tricuspid regurgitation. Right ventricular systolic pressure is elevated between 50-66mm Hg, consistent with moderately severe pulmonary hypertension.  Patient Profile     62 y.o. female with a history of morbid obesity, asthma, type 2 diabetes mellitus, hyperlipidemia, likely OHS/OSA, and pulmonary hypertension (presumably group 3) presenting with recent leg swelling and pain despite increasing diuretics.  No DVT by lower extremity Dopplers.  Assessment & Plan    1.  Pulmonary hypertension, presumably group 3.  She has followed through the [facility] practice, recently seen and continued on high-dose Demadex with addition of metolazone.  Not clear that she has had a right heart catheterization.  PASP was 50 to 60 mmHg by echocardiogram from 2017, I could not pull a more recent study.  She had a negative VQ scan in January of this year.  Has not had any recent sleep test based on discussion.  2.  Bilateral leg pain and swelling, no DVT by lower extremity Dopplers.  Diuretics had been recently increased and she did some show some signs of intravascular volume contraction based on BUN and creatinine.  3.  Restless leg syndrome.  4.  Depression.  5.  Possible asthma exacerbation, recent wheezing described with intermittent cough.  Chest x-ray reports mild cardiomegaly with bibasilar atelectasis.  Plan to resume Demadex although at 60 mg twice daily.  Hold off on metolazone.  She needs a follow-up echocardiogram for cardiac structural assessment and evaluation of PASP.  Would ask for Advanced Heart Failure team consultation regarding more complete work-up of pulmonary hypertension, possibly right heart catheterization.  She is going to need a sleep study as well.   Signed, Rozann Lesches, MD  04/23/2018, 12:56 PM

## 2018-04-23 NOTE — Progress Notes (Signed)
Report received from [facility].

## 2018-04-23 NOTE — Evaluation (Signed)
Occupational Therapy Evaluation Patient Details Name: [name] MRN: [id number] DOB: [birth year] Today's Date: 04/23/2018    History of Present Illness 62 y.o. female admitted with acute respiratory failure with hypoxia thought to be due to bronchitis/COPD exacerbation in setting of advanced COPD, pulmonary HTN, and R sided HF.    Clinical Impression   PTA, pt reports independence with ADL and functional mobility. However, she also states that her husband assists with her socks at times. She currently is limited by fatigue and decreased activity tolerance for ADL. She requires max assist for LB ADL and min guard assist for functional mobility. She demonstrated desaturation to 83% on 2L O2 during ambulation in hallway requiring pursed lip breathing and increased supplemental O2 to 3L for recovery to 90%. She would benefit from continued OT services to improve independence and safety with ADL and functional mobility prior to returning home.     Follow Up Recommendations  No OT follow up;Supervision/Assistance - 24 hour    Equipment Recommendations  None recommended by OT    Recommendations for Other Services       Precautions / Restrictions Precautions Precaution Comments: Watch O2 Restrictions Weight Bearing Restrictions: No      Mobility Bed Mobility Overal bed mobility: Independent                Transfers Overall transfer level: Modified independent                    Balance Overall balance assessment: Needs assistance   Sitting balance-Leahy Scale: Good       Standing balance-Leahy Scale: Fair                             ADL either performed or assessed with clinical judgement   ADL Overall ADL's : Needs assistance/impaired Eating/Feeding: Set up;Sitting   Grooming: Set up;Sitting   Upper Body Bathing: Set up;Supervision/ safety;Sitting   Lower Body Bathing: Maximal assistance;Sit to/from stand;With caregiver independent  assisting   Upper Body Dressing : Set up;Sitting   Lower Body Dressing: Maximal assistance;Sit to/from stand;With caregiver independent assisting Lower Body Dressing Details (indicate cue type and reason): assist to reach her feet for socks Toilet Transfer: Min guard;Ambulation   Toileting- Clothing Manipulation and Hygiene: Min guard;Sit to/from stand       Functional mobility during ADLs: Min guard General ADL Comments: able to ambulate in hallway on 2L O2 (with desaturation to 83%) increased to 3L due to desat and able to improve with increased time and pursed lip breathing to 90%     Vision Baseline Vision/History: Wears glasses Wears Glasses: Reading only Patient Visual Report: No change from baseline Vision Assessment?: No apparent visual deficits     Perception     Praxis      Pertinent Vitals/Pain Pain Assessment: Faces Faces Pain Scale: Hurts little more Pain Location: bilateral feet/lower extremities Pain Descriptors / Indicators: Aching;Discomfort Pain Intervention(s): Monitored during session;Repositioned;Limited activity within patient's tolerance     Hand Dominance     Extremity/Trunk Assessment Upper Extremity Assessment Upper Extremity Assessment: Generalized weakness   Lower Extremity Assessment Lower Extremity Assessment: Generalized weakness(pain/tingling/redness)       Communication Communication Communication: No difficulties   Cognition Arousal/Alertness: Lethargic Behavior During Therapy: WFL for tasks assessed/performed Overall Cognitive Status: Within Functional Limits for tasks assessed  General Comments: Functional although limited reasoning noted   General Comments  Pt with limited activity tolerance for ADL participation.     Exercises     Shoulder Instructions      Home Living Family/patient expects to be discharged to:: Private residence Living Arrangements: Spouse/significant  other;Children Available Help at Discharge: Family;Available 24 hours/day Type of Home: House Home Access: Level entry     Home Layout: Multi-level;Other (Comment)([place] states she no longer does stairs)     Bathroom Shower/Tub: Occupational psychologist: Standard     Home Equipment: Shower seat - built in          Prior Functioning/Environment Level of Independence: Independent        Comments: pt reporting she is independent no AD or home O2, denies falls        OT Problem List: Decreased strength;Decreased range of motion;Decreased activity tolerance;Impaired balance (sitting and/or standing);Decreased safety awareness;Decreased knowledge of use of DME or AE;Decreased knowledge of precautions;Cardiopulmonary status limiting activity;Pain      OT Treatment/Interventions: Self-care/ADL training;Therapeutic exercise;DME and/or AE instruction;Manual therapy;Therapeutic activities;Energy conservation;Patient/family education;Balance training    OT Goals(Current goals can be found in the care plan section) Acute Rehab OT Goals Patient Stated Goal: to rest OT Goal Formulation: With patient/family Time For Goal Achievement: 05/07/18 Potential to Achieve Goals: Good ADL Goals Pt Will Perform Lower Body Dressing: with modified independence;sit to/from stand Pt Will Transfer to Toilet: with modified independence;regular height toilet Pt Will Perform Toileting - Clothing Manipulation and hygiene: with modified independence;sit to/from stand Pt Will Perform Tub/Shower Transfer: with modified independence;ambulating;shower seat Additional ADL Goal #1: Pt will verbalize 3 strategies to conserve energy during daily ADL routine.  OT Frequency: Min 2X/week   Barriers to D/C:            Co-evaluation              AM-PAC OT "6 Clicks" Daily Activity     Outcome Measure Help from another person eating meals?: None Help from another person taking care of personal  grooming?: None Help from another person toileting, which includes using toliet, bedpan, or urinal?: A Little Help from another person bathing (including washing, rinsing, drying)?: A Lot Help from another person to put on and taking off regular upper body clothing?: None Help from another person to put on and taking off regular lower body clothing?: A Lot 6 Click Score: 19   End of Session Equipment Utilized During Treatment: Gait belt Nurse Communication: Mobility status  Activity Tolerance: Patient tolerated treatment well Patient left: with call bell/phone within reach;in bed;with family/visitor present(seated at EOB)  OT Visit Diagnosis: Other abnormalities of gait and mobility (R26.89);Pain Pain - Right/Left: (bilateral) Pain - part of body: Leg                Time: 1112-1130 OT Time Calculation (min): 18 min Charges:  OT General Charges $OT Visit: 1 Visit OT Evaluation $OT Eval Moderate Complexity: [facility] A Tamir Wallman 04/23/2018, 11:59 AM

## 2018-04-24 DIAGNOSIS — J9601 Acute respiratory failure with hypoxia: Secondary | ICD-10-CM

## 2018-04-24 DIAGNOSIS — I50812 Chronic right heart failure: Secondary | ICD-10-CM

## 2018-04-24 DIAGNOSIS — I2721 Secondary pulmonary arterial hypertension: Secondary | ICD-10-CM

## 2018-04-24 DIAGNOSIS — E876 Hypokalemia: Secondary | ICD-10-CM

## 2018-04-24 LAB — BASIC METABOLIC PANEL
Anion gap: 16 — ABNORMAL HIGH (ref 5–15)
BUN: 30 mg/dL — ABNORMAL HIGH (ref 8–23)
CO2: 46 mmol/L — ABNORMAL HIGH (ref 22–32)
Calcium: 8.9 mg/dL (ref 8.9–10.3)
Chloride: 74 mmol/L — ABNORMAL LOW (ref 98–111)
Creatinine, Ser: 0.99 mg/dL (ref 0.44–1.00)
GFR calc non Af Amer: >60 mL/min (ref >60)
Glucose, Bld: 177 mg/dL — ABNORMAL HIGH (ref 70–99)
Potassium: 2.8 mmol/L — ABNORMAL LOW (ref 3.5–5.1)
Sodium: 136 mmol/L (ref 135–145)

## 2018-04-24 LAB — CBC
HCT: 52 % — ABNORMAL HIGH (ref 36.0–46.0)
Hemoglobin: 15.5 g/dL — ABNORMAL HIGH (ref 12.0–15.0)
MCH: 26.1 pg (ref 26.0–34.0)
MCHC: 29.8 g/dL — AB (ref 30.0–36.0)
MCV: 87.7 fL (ref 80.0–100.0)
Platelets: 318 10*3/uL (ref 150–400)
RBC: 5.93 MIL/uL — ABNORMAL HIGH (ref 3.87–5.11)
RDW: 19.7 % — ABNORMAL HIGH (ref 11.5–15.5)
WBC: 10 10*3/uL (ref 4.0–10.5)
nRBC: 0 % (ref 0.0–0.2)

## 2018-04-24 LAB — GLUCOSE, CAPILLARY
Glucose-Capillary: 244 mg/dL — ABNORMAL HIGH (ref 70–99)
Glucose-Capillary: 174 mg/dL — ABNORMAL HIGH (ref 70–99)
Glucose-Capillary: 281 mg/dL — ABNORMAL HIGH (ref 70–99)
Glucose-Capillary: 157 mg/dL — ABNORMAL HIGH (ref 70–99)
Glucose-Capillary: 214 mg/dL — ABNORMAL HIGH (ref 70–99)

## 2018-04-24 MED ORDER — SODIUM CHLORIDE 0.9% FLUSH: 3.0000 mL | INTRAVENOUS | Status: DC | PRN

## 2018-04-24 MED ORDER — SODIUM CHLORIDE 0.9% FLUSH
3.0000 mL | Freq: Two times a day (BID) | INTRAVENOUS | Status: DC
  Administered 2018-04-24 (×2): 3 mL via INTRAVENOUS

## 2018-04-24 MED ORDER — METHYLPREDNISOLONE SODIUM SUCC 40 MG IJ SOLR
40.0000 mg | Freq: Two times a day (BID) | INTRAMUSCULAR | Status: AC
  Administered 2018-04-24: 40 mg via INTRAVENOUS
  Filled 2018-04-24: qty 1

## 2018-04-24 MED ORDER — SODIUM CHLORIDE 0.9 % IV SOLN: 250.0000 mL | INTRAVENOUS | Status: DC | PRN

## 2018-04-24 MED ORDER — SODIUM CHLORIDE 0.9% FLUSH: 3.0000 mL | Freq: Two times a day (BID) | INTRAVENOUS | Status: DC

## 2018-04-24 MED ORDER — SODIUM CHLORIDE 0.9 % IV SOLN
INTRAVENOUS | Status: DC
  Administered 2018-04-25: 06:00:00 via INTRAVENOUS

## 2018-04-24 MED ORDER — POTASSIUM CHLORIDE CRYS ER 20 MEQ PO TBCR
40.0000 meq | EXTENDED_RELEASE_TABLET | ORAL | Status: DC
  Administered 2018-04-24: 40 meq via ORAL
  Filled 2018-04-24: qty 2

## 2018-04-24 MED ORDER — SODIUM CHLORIDE 0.9 % IV SOLN: INTRAVENOUS | Status: DC

## 2018-04-24 MED ORDER — PREDNISONE 20 MG PO TABS
40.0000 mg | ORAL_TABLET | Freq: Every day | ORAL | Status: DC
  Administered 2018-04-25 – 2018-04-27 (×3): 40 mg via ORAL
  Filled 2018-04-24 (×3): qty 2

## 2018-04-24 MED ORDER — POTASSIUM CHLORIDE CRYS ER 20 MEQ PO TBCR
40.0000 meq | EXTENDED_RELEASE_TABLET | Freq: Three times a day (TID) | ORAL | Status: DC
  Administered 2018-04-24 – 2018-04-27 (×9): 40 meq via ORAL
  Filled 2018-04-24 (×10): qty 2

## 2018-04-24 NOTE — Progress Notes (Signed)
PROGRESS NOTE    [name]  WUJ:[id number] DOB: 07/18/1955 DOA: 04/22/2018 PCP: Patient, No Pcp Per  Brief Narrative: 62 year old female with history of severe pulmonary hypertension, diabetes type 2, COPD, 40+-pack-year history of smoking followed by cardiology at [facility] on torsemide 160 mg daily was recently seen by her outpatient providers on 12/9, felt to be fluid overloaded, metolazone was added to her medication regimen, reports diuresing well and significant improvement in her swelling since then. -She presented to our ED with productive cough, congestion, shortness of breath -Chest x-ray was unremarkable except for atelectasis at the bases, she was felt to be over diuresed and diuretics were held on admission -resp status improving with steroids and nebs  Assessment & Plan:     Acute respiratory failure with hypoxia ([place]) -Suspect bronchitis/COPD exacerbation in the background of advanced COPD, severe pulmonary hypertension and right-sided heart failure -Last echocardiogram was on 05/2017 outside hospital, unable to review this -Echo with PA pressures of 58, appreciate cardiology input, CHF team to see and determine need for right heart catheterization -Continue IV Solu-Medrol today will transition to prednisone taper from tomorrow -Wean O2 -Restarted on torsemide at a lower dose yesterday  Chronic right-sided heart failure/severe PAH -I suspect this is secondary to COPD, OSA OHS -She was unable to tolerate a sleep study in the past recommended sleep study as outpatient -Clinically appears close to euvolemic although volume status is difficult to assess in her  -Restarted oral torsemide yesterday  -2D echocardiogram EF of 95%, grade 1 diastolic dysfunction and PA pressure of 58  -Appreciate cardiology input, plan for consideration of right heart catheterization  Contraction alkalosis -Has chronically elevated CO2 at baseline due to compensation for chronic respiratory  acidosis -Does not appear dry to me at this time -CO2 has gone up but BUN and creatinine are improving, torsemide restarted  Diabetes mellitus with hyperglycemia -Check hemoglobin A1c -Sliding scale insulin changed to moderate coverage -Metformin on hold  DVT prophylaxis: Lovenox Code Status: Full code Family Communication: Spouse at bedside Disposition Plan: Home pending clinical improvement  Consultants:   Cards   Procedures:   Antimicrobials:    Subjective: -Feels better, cough and congestion improving, breathing a little better, not back to baseline yet   Objective: Vitals:   04/23/18 2316 04/24/18 0745 04/24/18 0815 04/24/18 0817  BP: (!) 111/58 (!) 118/57    Pulse: 92 92    Resp: 14 15    Temp: 98.4 F (36.9 C) 97.6 F (36.4 C)    TempSrc: Oral Oral    SpO2: 93%  94% 94%  Weight:      Height:       No intake or output data in the 24 hours ending 04/24/18 1034 Filed Weights   04/22/18 1730 04/23/18 0321  Weight: 109.8 kg 103.4 kg    Examination: Gen: Awake, Alert, Oriented X 3, chronically ill-appearing female, sitting up in bed, no distress HEENT: PERRLA, Neck supple, no JVD Lungs: Poor air movement, scattered rhonchi, no expiratory wheezes today CVS: RRR,No Gallops,Rubs or new Murmurs Abd: soft, Non tender, non distended, BS present Extremities: 1+ edema with mild erythema bilaterally and chronic venous stasis changes Skin: no new rashes Psychiatry: Judgement and insight appear normal. Mood & affect appropriate.     Data Reviewed:   CBC: Recent Labs  Lab 04/22/18 1739 04/23/18 0243 04/24/18 0259  WBC 12.4* 12.2* 10.0  NEUTROABS 9.1*  --   --   HGB 16.6* 15.7* 15.5*  HCT 55.3* 53.2*  52.0*  MCV 87.2 87.2 87.7  PLT 331 344 086   Basic Metabolic Panel: Recent Labs  Lab 04/22/18 1739 04/23/18 0243 04/24/18 0259  NA 131* 131* 136  K 3.3* 4.1 2.8*  CL 68* 69* 74*  CO2 41* 44* 46*  GLUCOSE 136* 121* 177*  BUN 45* 40* 30*    CREATININE 1.27* 1.12* 0.99  CALCIUM 9.7 9.1 8.9   GFR: Estimated Creatinine Clearance: 73.8 mL/min (by C-G formula based on SCr of 0.99 mg/dL). Liver Function Tests: Recent Labs  Lab 04/22/18 1739  AST 43*  ALT 17  ALKPHOS 88  BILITOT 3.2*  PROT 7.1  ALBUMIN 3.7   Recent Labs  Lab 04/22/18 1739  LIPASE 30   No results for input(s): AMMONIA in the last 168 hours. Coagulation Profile: No results for input(s): INR, PROTIME in the last 168 hours. Cardiac Enzymes: No results for input(s): CKTOTAL, CKMB, CKMBINDEX, TROPONINI in the last 168 hours. BNP (last 3 results) No results for input(s): PROBNP in the last 8760 hours. HbA1C: No results for input(s): HGBA1C in the last 72 hours. CBG: Recent Labs  Lab 04/22/18 2203 04/23/18 0754 04/23/18 1145 04/23/18 2131 04/24/18 0742  GLUCAP 158* 123* 142* 244* 174*   Lipid Profile: No results for input(s): CHOL, HDL, LDLCALC, TRIG, CHOLHDL, LDLDIRECT in the last 72 hours. Thyroid Function Tests: No results for input(s): TSH, T4TOTAL, FREET4, T3FREE, THYROIDAB in the last 72 hours. Anemia Panel: No results for input(s): VITAMINB12, FOLATE, FERRITIN, TIBC, IRON, RETICCTPCT in the last 72 hours. Urine analysis:    Component Value Date/Time   COLORURINE YELLOW 04/22/2018 1957   APPEARANCEUR CLEAR 04/22/2018 1957   LABSPEC 1.008 04/22/2018 1957   PHURINE 7.0 04/22/2018 1957   GLUCOSEU NEGATIVE 04/22/2018 1957   HGBUR NEGATIVE 04/22/2018 [place] NEGATIVE 04/22/2018 [place] NEGATIVE 04/22/2018 1957   PROTEINUR NEGATIVE 04/22/2018 1957   NITRITE NEGATIVE 04/22/2018 1957   LEUKOCYTESUR SMALL (A) 04/22/2018 1957   Sepsis Labs: @LABRCNTIP (procalcitonin:4,lacticidven:4)  ) Recent Results (from the past 240 hour(s))  Blood culture (routine x 2)     Status: None (Preliminary result)   Collection Time: 04/22/18  6:47 PM  Result Value Ref Range Status   Specimen Description BLOOD RIGHT HAND  Final    Special Requests   Final    BOTTLES DRAWN AEROBIC AND ANAEROBIC Blood Culture results may not be optimal due to an inadequate volume of blood received in culture bottles   Culture   Final    NO GROWTH 2 DAYS Performed at [facility] [address] [place], [place] [address]    Report Status PENDING  Incomplete  Blood culture (routine x 2)     Status: None (Preliminary result)   Collection Time: 04/22/18  6:48 PM  Result Value Ref Range Status   Specimen Description BLOOD LEFT WRIST  Final   Special Requests   Final    BOTTLES DRAWN AEROBIC AND ANAEROBIC Blood Culture adequate volume   Culture  Setup Time   Final    GRAM POSITIVE COCCI IN CLUSTERS AEROBIC BOTTLE ONLY CRITICAL RESULT CALLED TO, READ BACK BY AND VERIFIED WITH: Clenton Pare, [id number] FCP Performed at [facility], [place] [address] [place],  [address]    Culture [facility] POSITIVE COCCI  Final   Report Status PENDING  Incomplete  Blood Culture ID Panel (Reflexed)     Status: Abnormal   Collection Time: 04/22/18  6:48 PM  Result  Value Ref Range Status   Enterococcus species NOT DETECTED NOT DETECTED Final   Listeria monocytogenes NOT DETECTED NOT DETECTED Final   Staphylococcus species DETECTED (A) NOT DETECTED Final    Comment: Methicillin (oxacillin) susceptible coagulase negative staphylococcus. Possible blood culture contaminant (unless isolated from more than one blood culture draw or clinical case suggests pathogenicity). No antibiotic treatment is indicated for blood  culture contaminants. CRITICAL RESULT CALLED TO, READ BACK BY AND VERIFIED WITH: PHARMD DEJA, [id number] FCP    Staphylococcus aureus (BCID) NOT DETECTED NOT DETECTED Final   Methicillin resistance NOT DETECTED NOT DETECTED Final   Streptococcus species NOT DETECTED NOT DETECTED Final   Streptococcus agalactiae NOT DETECTED NOT DETECTED Final   Streptococcus pneumoniae NOT DETECTED NOT DETECTED Final   Streptococcus pyogenes  NOT DETECTED NOT DETECTED Final   Acinetobacter baumannii NOT DETECTED NOT DETECTED Final   Enterobacteriaceae species NOT DETECTED NOT DETECTED Final   Enterobacter cloacae complex NOT DETECTED NOT DETECTED Final   Escherichia coli NOT DETECTED NOT DETECTED Final   Klebsiella oxytoca NOT DETECTED NOT DETECTED Final   Klebsiella pneumoniae NOT DETECTED NOT DETECTED Final   Proteus species NOT DETECTED NOT DETECTED Final   Serratia marcescens NOT DETECTED NOT DETECTED Final   Haemophilus influenzae NOT DETECTED NOT DETECTED Final   Neisseria meningitidis NOT DETECTED NOT DETECTED Final   Pseudomonas aeruginosa NOT DETECTED NOT DETECTED Final   Candida albicans NOT DETECTED NOT DETECTED Final   Candida glabrata NOT DETECTED NOT DETECTED Final   Candida krusei NOT DETECTED NOT DETECTED Final   Candida parapsilosis NOT DETECTED NOT DETECTED Final   Candida tropicalis NOT DETECTED NOT DETECTED Final    Comment: Performed at [facility], [address], [place], [place] [address]         Radiology Studies: Dg Chest Portable 1 View  Result Date: 04/22/2018 CLINICAL DATA:  Shortness of breath EXAM: PORTABLE CHEST 1 VIEW COMPARISON:  01/22/2008 FINDINGS: The lungs are well inflated. There is mild cardiomegaly. There is bibasilar atelectasis. No focal consolidation or pulmonary edema. There is no pleural effusion or pneumothorax. IMPRESSION: Mild cardiomegaly and bibasilar atelectasis. Electronically Signed   By: Ulyses Jarred M.D.   On: 04/22/2018 18:21   Vas [place] Lower Extremity Venous (dvt) (only Mc & Wl)  Result Date: 04/23/2018  Lower Venous Study Indications: Swelling, and Edema.  Performing Technologist: Abram Sander RVS  Examination Guidelines: A complete evaluation includes B-mode imaging, spectral Doppler, color Doppler, and power Doppler as needed of all accessible portions of each vessel. Bilateral testing is considered an integral part of a complete examination. Limited  examinations for reoccurring indications may be performed as noted.  Right Venous Findings: +---------+---------------+---------+-----------+----------+-------+          CompressibilityPhasicitySpontaneityPropertiesSummary +---------+---------------+---------+-----------+----------+-------+ CFV      Full           Yes      Yes                          +---------+---------------+---------+-----------+----------+-------+ SFJ      Full                                                 +---------+---------------+---------+-----------+----------+-------+ FV Prox  Full                                                 +---------+---------------+---------+-----------+----------+-------+  FV Mid   Full                                                 +---------+---------------+---------+-----------+----------+-------+ FV DistalFull                                                 +---------+---------------+---------+-----------+----------+-------+ PFV      Full                                                 +---------+---------------+---------+-----------+----------+-------+ POP      Full           Yes      Yes                          +---------+---------------+---------+-----------+----------+-------+ PTV      Full                                                 +---------+---------------+---------+-----------+----------+-------+ PERO     Full                                                 +---------+---------------+---------+-----------+----------+-------+  Left Venous Findings: +---------+---------------+---------+-----------+----------+--------------+          CompressibilityPhasicitySpontaneityPropertiesSummary        +---------+---------------+---------+-----------+----------+--------------+ CFV      Full           Yes      Yes                                 +---------+---------------+---------+-----------+----------+--------------+ SFJ       Full                                                        +---------+---------------+---------+-----------+----------+--------------+ FV Prox  Full                                                        +---------+---------------+---------+-----------+----------+--------------+ FV Mid   Full                                                        +---------+---------------+---------+-----------+----------+--------------+ FV DistalFull                                                        +---------+---------------+---------+-----------+----------+--------------+  PFV      Full                                                        +---------+---------------+---------+-----------+----------+--------------+ POP      Full           Yes      Yes                                 +---------+---------------+---------+-----------+----------+--------------+ PTV      Full                                                        +---------+---------------+---------+-----------+----------+--------------+ PERO                                                  Not visualized +---------+---------------+---------+-----------+----------+--------------+    Summary: Right: There is no evidence of deep vein thrombosis in the lower extremity. No cystic structure found in the popliteal fossa. Left: There is no evidence of deep vein thrombosis in the lower extremity. No cystic structure found in the popliteal fossa.  *See table(s) above for measurements and observations. Electronically signed by Deitra Mayo MD on 04/23/2018 at 7:07:49 AM.    Final         Scheduled Meds: . atorvastatin  40 mg Oral Daily  . enoxaparin (LOVENOX) injection  40 mg Subcutaneous Q24H  . escitalopram  20 mg Oral Daily  . insulin aspart  0-15 Units Subcutaneous TID WC  . insulin aspart  0-5 Units Subcutaneous QHS  . ipratropium-albuterol  3 mL Nebulization TID  . methylPREDNISolone  (SOLU-MEDROL) injection  40 mg Intravenous Q12H  . mometasone-formoterol  1 puff Inhalation BID  . montelukast  10 mg Oral Daily  . potassium chloride  40 mEq Oral TID  . [START ON 04/25/2018] predniSONE  40 mg Oral Q breakfast  . torsemide  60 mg Oral BID   Continuous Infusions:   LOS: 2 days    Time spent: 29min    Domenic Polite, MD [facility] Page via [email or web address], password [email or web address] After 7PM please contact night-coverage  04/24/2018, 10:34 AM

## 2018-04-24 NOTE — Progress Notes (Signed)
Progress Note  Patient Name: [name] Date of Encounter: 04/24/2018  Primary Cardiologist: No primary care provider on file. [facility]  Subjective   Feeling better.  Leg swelling and redness is improving, but she continues to have pain in her legs.  Breathing is stable.  Inpatient Medications    Scheduled Meds: . atorvastatin  40 mg Oral Daily  . enoxaparin (LOVENOX) injection  40 mg Subcutaneous Q24H  . escitalopram  20 mg Oral Daily  . insulin aspart  0-15 Units Subcutaneous TID WC  . insulin aspart  0-5 Units Subcutaneous QHS  . ipratropium-albuterol  3 mL Nebulization TID  . methylPREDNISolone (SOLU-MEDROL) injection  40 mg Intravenous Q12H  . mometasone-formoterol  1 puff Inhalation BID  . montelukast  10 mg Oral Daily  . potassium chloride  40 mEq Oral Q2H  . torsemide  60 mg Oral BID   Continuous Infusions:  PRN Meds: acetaminophen **OR** acetaminophen, albuterol, HYDROcodone-acetaminophen, ondansetron **OR** ondansetron (ZOFRAN) IV, polyethylene glycol, zolpidem   Vital Signs    Vitals:   04/23/18 2316 04/24/18 0745 04/24/18 0815 04/24/18 0817  BP: (!) 111/58 (!) 118/57    Pulse: 92 92    Resp: 14 15    Temp: 98.4 F (36.9 C) 97.6 F (36.4 C)    TempSrc: Oral Oral    SpO2: 93%  94% 94%  Weight:      Height:       No intake or output data in the 24 hours ending 04/24/18 0926 Filed Weights   04/22/18 1730 04/23/18 0321  Weight: 109.8 kg 103.4 kg    Telemetry    Sinus rhythm.  PVCs- Personally Reviewed  ECG    n/a - Personally Reviewed  Physical Exam   VS:  BP (!) 118/57   Pulse 92   Temp 97.6 F (36.4 C) (Oral)   Resp 15   Ht 5\' 7"  (1.702 m) Comment: pt stated  Wt 103.4 kg   SpO2 94%   BMI 35.70 kg/m  , BMI Body mass index is 35.7 kg/m. GENERAL: Chronically ill-appearing. Lying flat in bed.  No acute distress. HEENT: Pupils equal round and reactive, fundi not visualized, oral mucosa unremarkable NECK:  jugular venous distention  difficult to assess, carotid upstroke brisk and symmetric, no bruits, LUNGS:  Clear to auscultation bilaterally HEART:  RRR.  PMI not displaced or sustained,S1 and S2 within normal limits, no S3, no S4, no clicks, no rubs, no murmurs ABD:  Flat, positive bowel sounds normal in frequency in pitch, no bruits, no rebound, no guarding, no midline pulsatile mass, no hepatomegaly, no splenomegaly EXT:  2 plus pulses throughout, 1+ LE lower edema, no cyanosis no clubbing SKIN:  No rashes no nodules NEURO:  Cranial nerves II through XII grossly intact, motor grossly intact throughout [facility]:  Cognitively intact, oriented to person place and time   Labs    Chemistry Recent Labs  Lab 04/22/18 1739 04/23/18 0243 04/24/18 0259  NA 131* 131* 136  K 3.3* 4.1 2.8*  CL 68* 69* 74*  CO2 41* 44* 46*  GLUCOSE 136* 121* 177*  BUN 45* 40* 30*  CREATININE 1.27* 1.12* 0.99  CALCIUM 9.7 9.1 8.9  PROT 7.1  --   --   ALBUMIN 3.7  --   --   AST 43*  --   --   ALT 17  --   --   ALKPHOS 88  --   --   BILITOT 3.2*  --   --  GFRNONAA 46* 53* >60  GFRAA 53* >60 >60  ANIONGAP 22* 18* 16*     Hematology Recent Labs  Lab 04/22/18 1739 04/23/18 0243 04/24/18 0259  WBC 12.4* 12.2* 10.0  RBC 6.34* 6.10* 5.93*  HGB 16.6* 15.7* 15.5*  HCT 55.3* 53.2* 52.0*  MCV 87.2 87.2 87.7  MCH 26.2 25.7* 26.1  MCHC 30.0 29.5* 29.8*  RDW 20.2* 19.9* 19.7*  PLT 331 344 318    Cardiac EnzymesNo results for input(s): TROPONINI in the last 168 hours.  Recent Labs  Lab 04/22/18 1757 04/22/18 2114  TROPIPOC 0.05 0.04     BNP Recent Labs  Lab 04/22/18 1739  BNP 501.9*     DDimer  Recent Labs  Lab 04/22/18 1901  DDIMER 0.38     Radiology    Dg Chest Portable 1 View  Result Date: 04/22/2018 CLINICAL DATA:  Shortness of breath EXAM: PORTABLE CHEST 1 VIEW COMPARISON:  01/22/2008 FINDINGS: The lungs are well inflated. There is mild cardiomegaly. There is bibasilar atelectasis. No focal consolidation  or pulmonary edema. There is no pleural effusion or pneumothorax. IMPRESSION: Mild cardiomegaly and bibasilar atelectasis. Electronically Signed   By: Ulyses Jarred M.D.   On: 04/22/2018 18:21   Vas [place] Lower Extremity Venous (dvt) (only Mc & Wl)  Result Date: 04/23/2018  Lower Venous Study Indications: Swelling, and Edema.  Performing Technologist: Abram Sander RVS  Examination Guidelines: A complete evaluation includes B-mode imaging, spectral Doppler, color Doppler, and power Doppler as needed of all accessible portions of each vessel. Bilateral testing is considered an integral part of a complete examination. Limited examinations for reoccurring indications may be performed as noted.  Right Venous Findings: +---------+---------------+---------+-----------+----------+-------+          CompressibilityPhasicitySpontaneityPropertiesSummary +---------+---------------+---------+-----------+----------+-------+ CFV      Full           Yes      Yes                          +---------+---------------+---------+-----------+----------+-------+ SFJ      Full                                                 +---------+---------------+---------+-----------+----------+-------+ FV Prox  Full                                                 +---------+---------------+---------+-----------+----------+-------+ FV Mid   Full                                                 +---------+---------------+---------+-----------+----------+-------+ FV DistalFull                                                 +---------+---------------+---------+-----------+----------+-------+ PFV      Full                                                 +---------+---------------+---------+-----------+----------+-------+  POP      Full           Yes      Yes                          +---------+---------------+---------+-----------+----------+-------+ PTV      Full                                                  +---------+---------------+---------+-----------+----------+-------+ PERO     Full                                                 +---------+---------------+---------+-----------+----------+-------+  Left Venous Findings: +---------+---------------+---------+-----------+----------+--------------+          CompressibilityPhasicitySpontaneityPropertiesSummary        +---------+---------------+---------+-----------+----------+--------------+ CFV      Full           Yes      Yes                                 +---------+---------------+---------+-----------+----------+--------------+ SFJ      Full                                                        +---------+---------------+---------+-----------+----------+--------------+ FV Prox  Full                                                        +---------+---------------+---------+-----------+----------+--------------+ FV Mid   Full                                                        +---------+---------------+---------+-----------+----------+--------------+ FV DistalFull                                                        +---------+---------------+---------+-----------+----------+--------------+ PFV      Full                                                        +---------+---------------+---------+-----------+----------+--------------+ POP      Full           Yes      Yes                                 +---------+---------------+---------+-----------+----------+--------------+  PTV      Full                                                        +---------+---------------+---------+-----------+----------+--------------+ PERO                                                  Not visualized +---------+---------------+---------+-----------+----------+--------------+    Summary: Right: There is no evidence of deep vein thrombosis in the lower extremity. No cystic  structure found in the popliteal fossa. Left: There is no evidence of deep vein thrombosis in the lower extremity. No cystic structure found in the popliteal fossa.  *See table(s) above for measurements and observations. Electronically signed by Deitra Mayo MD on 04/23/2018 at 7:07:49 AM.    Final     Cardiac Studies   Echo 04/23/18: Study Conclusions  - Left ventricle: The cavity size was normal. Wall thickness was   increased in a pattern of mild LVH. Systolic function was normal.   The estimated ejection fraction was in the range of 60% to 65%.   Wall motion was normal; there were no regional wall motion   abnormalities. Doppler parameters are consistent with abnormal   left ventricular relaxation (grade 1 diastolic dysfunction). - Ventricular septum: The contour showed diastolic flattening. - Mitral valve: Moderately calcified annulus. Valve area by   continuity equation (using LVOT flow): 2.16 cm^2. - Right ventricle: The cavity size was mildly dilated. Wall   thickness was normal. - Pulmonary arteries: Systolic pressure was moderately increased.   PA peak pressure: 58 mm Hg (S).  Patient Profile     62 y.o. female with moderate to severe pulmonary hypertension, diabetes, morbid obesity, hyperlipidemia, and likely OHS/OSA here with acute on chronic right heart failure and volume overload as well as acute renal insufficiency in the setting of diuresis.  Assessment & Plan    # Moderate to severe pulmonary hypertension: Ms. Mccain has struggled with volume overload. BNP was elevated to 500 on admission yet she also had AKI in the setting of aggressive diuresis.  I personally reviewed her echo.  LV function is normal with mild-moderate LVH.  RV function is normal but RV is dilated.  IVC is dilated consistent with volume overload.  Agree with resuming metolazone.  Likely WHO Class III pulmonary hypertension.  Thought to be due to OSA/OHS.  She will need a sleep study.  Will ask  HF team to see.  Given worsening renal function with diuresis and persistent volume overload she may benefit from [place].  OSH V/Q scan was low probability earlier this year.  D-dimer negative this admission. There were no significant LFT abnormalities this admission.  Will check ANA, RF, ANCA for autoimmune causes.  No I/O or weights recorded.  Will order.  # Hypokalemia: Potassium was 2.8 with diuresis.  Will supplement.   # Hyperlipidemia: Continue atorvastatin.       For questions or updates, please contact [place] Please consult [email or web address] for contact info under        Signed, Skeet Latch, MD  04/24/2018, 9:26 AM

## 2018-04-24 NOTE — Progress Notes (Signed)
Nutrition Consult/Brief Note  RD consulted per COPD Focused Protocol.  Wt Readings from Last 15 Encounters:  04/23/18 103.4 kg   Body mass index is 35.7 kg/m. Patient meets criteria for Obesity Class II based on current BMI.   Current diet order is NPO. No nutrition problems identified PTA. Labs and medications reviewed.   No nutrition interventions warranted at this time. If nutrition issues arise, please consult RD.   Arthur Holms, RD, LDN Pager #: [phone] After-Hours Pager #: [phone]

## 2018-04-24 NOTE — Consult Note (Addendum)
Advanced Heart Failure Team Consult Note   Primary Physician: Patient, No Pcp Per PCP-Cardiologist:  No primary care provider on file.  Reason for Consultation: Pulmonary HTN  HPI:    [name] is seen today for evaluation of Pulmonary HTN at the request of Dr. Oval Linsey.   [name] is a 62 y.o. female with history of pulmonary HTN, Obesity, asthma, DM2, ADHD, and long smoking history.   Seen 04/17/18 by her outpatient providers at [facility], and metolazone added to her regimen of torsemide of 150 mg daily. She had significant diuresis from this, but began to complain of more leg pain and continued NYHA IIIb symptoms.   She presented to [facility] 04/22/18 with these symptoms. Pertinent labs on admission included Cr 1.2 ( from 0.88), Bicarb 41, K 3.3, Initial lactate 3.0 but trended down. ECG showed RVH, repol abnormalities, and prolonged QTc, troponin negative, BNP 502. CXR without pulmonary edema. Echo performed as below which showed suspected mod/sev pulmonary HTN.   Today, she feels better than on admit, but remains SOB with exertion. Her LEs remains very sore, which is not typical of her. She also continues to require O2 via [place], which she is not on chronically. Her I/Os have not been recorded. She weight 242 lbs on admit, and 227 lbs the next day so ? Accuracy. Weight pending today. She has a long history of smoking, but states she is not currently. She states she will "smoke for a year then stop for a year" on and off.   Echo 04/23/18 LVEF 60-65%, Grade 1 DD, PA peak pressure 58 mm Hg.   Outside studies VQ scan 05/23/2017 ([facility])- Low probability of PE. Echo 05/23/17 ([facility]) - Not available. Per Note 04/17/18 -> "Normal LV function. 'Severe' pulmonary HTN" Echo 05/2015 ([facility]) - EF 60-65%, mild TR, Right Ventricular systolic pressure 25-95 mmHg, consistent with Mod/Sev pulmonary HTN. Normal RV size and function.   Review of Systems: [y] = yes, [ ]  = no   General:  Weight gain [ ] ; Weight loss [ ] ; Anorexia [ ] ; Fatigue [ ] ; Fever [ ] ; Chills [ ] ; Weakness [ ]   Cardiac: Chest pain/pressure [ ] ; Resting SOB [ ] ; Exertional SOB [ ] ; Orthopnea [ ] ; Pedal Edema [ ] ; Palpitations [ ] ; Syncope [ ] ; Presyncope [ ] ; Paroxysmal nocturnal dyspnea[ ]   Pulmonary: Cough [ ] ; Wheezing[ ] ; Hemoptysis[ ] ; Sputum [ ] ; Snoring [ ]   GI: Vomiting[ ] ; Dysphagia[ ] ; Melena[ ] ; Hematochezia [ ] ; Heartburn[ ] ; Abdominal pain [ ] ; Constipation [ ] ; Diarrhea [ ] ; BRBPR [ ]   GU: Hematuria[ ] ; Dysuria [ ] ; Nocturia[ ]   Vascular: Pain in legs with walking [ ] ; Pain in feet with lying flat [ ] ; Non-healing sores [ ] ; Stroke [ ] ; TIA [ ] ; Slurred speech [ ] ;  Neuro: Headaches[ ] ; Vertigo[ ] ; Seizures[ ] ; Paresthesias[ ] ;Blurred vision [ ] ; Diplopia [ ] ; Vision changes [ ]   Ortho/Skin: Arthritis [ ] ; Joint pain [ ] ; Muscle pain [ ] ; Joint swelling [ ] ; Back Pain [ ] ; Rash [ ]   Psych: Depression[ ] ; Anxiety[ ]   Heme: Bleeding problems [ ] ; Clotting disorders [ ] ; Anemia [ ]   Endocrine: Diabetes [ ] ; Thyroid dysfunction[ ]   Home Medications Prior to Admission medications   Medication Sig Start Date End Date Taking? Authorizing Provider  Amphetamine-Dextroamphetamine (ADDERALL PO) Take 10 mg by mouth 3 (three) times daily.    Yes [provider]  atorvastatin (LIPITOR) 40 MG  tablet Take 40 mg by mouth daily.   Yes [provider]  escitalopram (LEXAPRO) 20 MG tablet Take 20 mg by mouth daily.   Yes [provider]  HYDROcodone-acetaminophen (NORCO/VICODIN) 5-325 MG tablet Take 1 tablet by mouth at bedtime as needed (restless legs).   Yes [provider]  metFORMIN (GLUCOPHAGE) 500 MG tablet Take 500 mg by mouth 2 (two) times daily with a meal.   Yes [provider]  metolazone (ZAROXOLYN) 2.5 MG tablet Take 5 mg by mouth every other day.   Yes [provider]  montelukast (SINGULAIR) 10 MG tablet Take 10 mg by mouth daily.   Yes  [provider]  potassium chloride (K-DUR,KLOR-CON) 10 MEQ tablet Take 20 mEq by mouth daily.   Yes [provider]  torsemide (DEMADEX) 100 MG tablet Take 150 mg by mouth daily.   Yes [provider]    Past Medical History: Past Medical History:  Diagnosis Date  . ADHD   . Chronic right-sided CHF (congestive heart failure) ([place])   . Depression   . GERD (gastroesophageal reflux disease)   . Migraine   . PAH (pulmonary artery hypertension) ([place])   . Restless leg syndrome     Past Surgical History: Past Surgical History:  Procedure Laterality Date  . c sections    . CHOLECYSTECTOMY    . TONSILLECTOMY      Family History: Family History  Problem Relation Age of Onset  . Alcohol abuse Sister   . Drug abuse Sister   . Mental illness Sister   . Early death Sister   . Pancreatic cancer Mother   . Emphysema Father     Social History: Social History   Socioeconomic History  . Marital status: Married    Spouse name: Statistician  . Number of children: 1  . Years of education: Not on file  . Highest education level: Not on file  Occupational History  . Not on file  Social Needs  . Financial resource strain: Somewhat hard  . Food insecurity:    Worry: Sometimes true    Inability: Sometimes true  . Transportation needs:    Medical: No    Non-medical: No  Tobacco Use  . Smoking status: Former Smoker    Packs/day: 0.50    Types: Cigarettes  . Smokeless tobacco: Never Used  Substance and Sexual Activity  . Alcohol use: No  . Drug use: No  . Sexual activity: Not Currently  Lifestyle  . Physical activity:    Days per week: 7 days    Minutes per session: 10 min  . Stress: To some extent  Relationships  . Social connections:    Talks on phone: More than three times a week    Gets together: Once a week    Attends religious service: Never    Active member of club or organization: No    Attends meetings of clubs or organizations: Never      Relationship status: Married  Other Topics Concern  . Not on file  Social History Narrative  . Not on file    Allergies:  Allergies  Allergen Reactions  . Asa [Aspirin] Nausea Only    abd pain  . Codeine Nausea Only  . Sulfa Antibiotics Hives    Objective:    Vital Signs:   Temp:  [97.6 F (36.4 C)-98.5 F (36.9 C)] 97.6 F (36.4 C) (12/16 0745) Pulse Rate:  [92-94] 92 (12/16 0745) Resp:  [14-16] 15 (12/16  0745) BP: (106-118)/(57-63) 118/57 (12/16 0745) SpO2:  [93 %-95 %] 94 % (12/16 0817) Last BM Date: (PTA)  Weight change: Filed Weights   04/22/18 1730 04/23/18 0321  Weight: 109.8 kg 103.4 kg    Intake/Output:   Intake/Output Summary (Last 24 hours) at 04/24/2018 1106 Last data filed at 04/24/2018 1000 Gross per 24 hour  Intake 240 ml  Output -  Net 240 ml      Physical Exam    General: Fatigued appearing.  HEENT: Normal Neck: Supple. JVP ~8-9 cm. Carotids 2+ bilat; no bruits. No lymphadenopathy or thyromegaly appreciated. Cor: PMI nondisplaced. Regular, slightly tachy. No rubs, gallops or murmurs. Lungs: Slightly diminished throughout.  Abdomen: Soft, nontender, nondistended. No hepatosplenomegaly. No bruits or masses. Good bowel sounds. Extremities: No cyanosis, clubbing, or rash. Trace to 1+ edema 1/2 way to knees. Tenderness out of proportion to exam.  Neuro: Alert & orientedx3, cranial nerves grossly intact. moves all 4 extremities w/o difficulty. Affect pleasant  Telemetry   NSR 90s, personally reviewed.   EKG    04/23/18 Sinus tachycardia 99 bpm, personally reviewed  Labs   Basic Metabolic Panel: Recent Labs  Lab 04/22/18 1739 04/23/18 0243 04/24/18 0259  NA 131* 131* 136  K 3.3* 4.1 2.8*  CL 68* 69* 74*  CO2 41* 44* 46*  GLUCOSE 136* 121* 177*  BUN 45* 40* 30*  CREATININE 1.27* 1.12* 0.99  CALCIUM 9.7 9.1 8.9    Liver Function Tests: Recent Labs  Lab 04/22/18 1739  AST 43*  ALT 17  ALKPHOS 88  BILITOT 3.2*  PROT  7.1  ALBUMIN 3.7   Recent Labs  Lab 04/22/18 1739  LIPASE 30   No results for input(s): AMMONIA in the last 168 hours.  CBC: Recent Labs  Lab 04/22/18 1739 04/23/18 0243 04/24/18 0259  WBC 12.4* 12.2* 10.0  NEUTROABS 9.1*  --   --   HGB 16.6* 15.7* 15.5*  HCT 55.3* 53.2* 52.0*  MCV 87.2 87.2 87.7  PLT 331 344 318    Cardiac Enzymes: No results for input(s): CKTOTAL, CKMB, CKMBINDEX, TROPONINI in the last 168 hours.  BNP: BNP (last 3 results) Recent Labs    04/22/18 1739  BNP 501.9*    ProBNP (last 3 results) No results for input(s): PROBNP in the last 8760 hours.   CBG: Recent Labs  Lab 04/22/18 2203 04/23/18 0754 04/23/18 1145 04/23/18 2131 04/24/18 0742  GLUCAP 158* 123* 142* 244* 174*    Coagulation Studies: No results for input(s): LABPROT, INR in the last 72 hours.   Imaging    No results found.   Medications:     Current Medications: . atorvastatin  40 mg Oral Daily  . enoxaparin (LOVENOX) injection  40 mg Subcutaneous Q24H  . escitalopram  20 mg Oral Daily  . insulin aspart  0-15 Units Subcutaneous TID WC  . insulin aspart  0-5 Units Subcutaneous QHS  . ipratropium-albuterol  3 mL Nebulization TID  . methylPREDNISolone (SOLU-MEDROL) injection  40 mg Intravenous Q12H  . mometasone-formoterol  1 puff Inhalation BID  . montelukast  10 mg Oral Daily  . potassium chloride  40 mEq Oral TID  . [START ON 04/25/2018] predniSONE  40 mg Oral Q breakfast  . torsemide  60 mg Oral BID     Infusions:   Patient Profile   CHARLANN WAYNE is a 62 y.o. female with history of pulmonary HTN, Obesity, asthma, DM2, ADHD, and long smoking history.   Admitted 04/20/18 with volume  overload.   Assessment/Plan   1. Pulmonary Hypertension - Suspect WHO group III disease in setting of obesity, asthma, tobacco abuse, and suspected OSA.   - Noted in Care everywhere since at least 2017, but does not appear to have had a Cath. She does not remember  having one.  - Will plan for RHC this afternoon - Continue torsemide 60 mg BID for now. Adjust further based on cath. She was on torsemide 150 mg DAILY, prior to admit. Should divide into BID dosing as above. - Serologies sent today.   2. Hypokalemia - K 2.8 this am. Ordered for supp. Follow.   3. Obesity - Body mass index is 35.7 kg/m.  - Encouraged weight loss - Suspect OSA/OHS. Will need sleep study as outpatient.   4. DM2 - Per primary  5. ADHD - She is on Adderall chronically. Not ideal for patients with structural heart disease. Will discuss with MD.   6. Tobacco abuse - She is not a great historian, but states she is not currently smoking - Encouraged continued cessation.   Plan for RHC this afternoon to further assess her PAH.   Medication concerns reviewed with patient and pharmacy team. Barriers identified: None at this time.   Length of Stay: 2  Shirley Friar, PA-C  04/24/2018, 11:06 AM  Advanced Heart Failure Team Pager [phone] (M-F; 7a - 4p)  Please contact [facility] for night-coverage after hours (4p -7a ) and weekends on [email or web address]  Patient seen and examined with the above-signed Advanced Practice Provider and/or Housestaff. I personally reviewed laboratory data, imaging studies and relevant notes. I independently examined the patient and formulated the important aspects of the plan. I have edited the note to reflect any of my changes or salient points. I have personally discussed the plan with the patient and/or family.  62 y/o woman with morbid obesity, ADHD, tobacco abuse, diastolic HF and probable OSA admitted with HF symptoms. Echo reviewed and found to have pulmonary HTN and RV failure. VQ negative. Suspect combination of WHO Group II & III disease. (primarily III). Will plan RHC in am - was scheduled for today but no room in cath lab. Will also need repeat PFTs with DLCO and sleep study when improved.   Glori Bickers, MD  7:21  PM

## 2018-04-25 ENCOUNTER — Encounter (HOSPITAL_COMMUNITY)
Admission: EM | Disposition: A | Discharge status: Home Health | Payer: Self-pay | Source: Home / Self Care | Attending: Internal Medicine

## 2018-04-25 ENCOUNTER — Encounter (HOSPITAL_COMMUNITY): Payer: Self-pay | Admitting: Internal Medicine

## 2018-04-25 HISTORY — PX: RIGHT HEART CATH: CATH118263

## 2018-04-25 LAB — BASIC METABOLIC PANEL
Anion gap: 14 (ref 5–15)
BUN: 31 mg/dL — ABNORMAL HIGH (ref 8–23)
CO2: 47 mmol/L — ABNORMAL HIGH (ref 22–32)
CREATININE: 1.08 mg/dL — AB (ref 0.44–1.00)
Calcium: 9 mg/dL (ref 8.9–10.3)
Chloride: 78 mmol/L — ABNORMAL LOW (ref 98–111)
GFR calc Af Amer: >60 mL/min (ref >60)
GFR calc non Af Amer: 55 mL/min — ABNORMAL LOW (ref >60)
Glucose, Bld: 160 mg/dL — ABNORMAL HIGH (ref 70–99)
Potassium: 3.4 mmol/L — ABNORMAL LOW (ref 3.5–5.1)
Sodium: 139 mmol/L (ref 135–145)

## 2018-04-25 LAB — POCT I-STAT 3, VENOUS BLOOD GAS (G3P V)
Acid-Base Excess: 20 mmol/L — ABNORMAL HIGH (ref 0.0–2.0)
Acid-Base Excess: 22 mmol/L — ABNORMAL HIGH (ref 0.0–2.0)
Acid-Base Excess: 22 mmol/L — ABNORMAL HIGH (ref 0.0–2.0)
Bicarbonate: 51.7 mmol/L — ABNORMAL HIGH (ref 20.0–28.0)
Bicarbonate: 53.6 mmol/L — ABNORMAL HIGH (ref 20.0–28.0)
Bicarbonate: 53.7 mmol/L — ABNORMAL HIGH (ref 20.0–28.0)
O2 SAT: 69 %
O2 Saturation: 73 %
O2 Saturation: 73 %
PCO2 VEN: 83.1 mmHg — AB (ref 44.0–60.0)
PH VEN: 7.419 (ref 7.250–7.430)
TCO2: >50 mmol/L — ABNORMAL HIGH (ref 22–32)
TCO2: >50 mmol/L — ABNORMAL HIGH (ref 22–32)
TCO2: >50 mmol/L — ABNORMAL HIGH (ref 22–32)
pCO2, Ven: 82.7 mmHg (ref 44.0–60.0)
pCO2, Ven: 82.7 mmHg (ref 44.0–60.0)
pH, Ven: 7.402 (ref 7.250–7.430)
pH, Ven: 7.42 (ref 7.250–7.430)
pO2, Ven: 39 mmHg (ref 32.0–45.0)
pO2, Ven: 41 mmHg (ref 32.0–45.0)
pO2, Ven: 41 mmHg (ref 32.0–45.0)

## 2018-04-25 LAB — GLUCOSE, CAPILLARY
Glucose-Capillary: 160 mg/dL — ABNORMAL HIGH (ref 70–99)
Glucose-Capillary: 215 mg/dL — ABNORMAL HIGH (ref 70–99)
Glucose-Capillary: 221 mg/dL — ABNORMAL HIGH (ref 70–99)
Glucose-Capillary: 312 mg/dL — ABNORMAL HIGH (ref 70–99)

## 2018-04-25 LAB — MPO/PR-3 (ANCA) ANTIBODIES
ANCA Proteinase 3: <3.5 U/mL (ref 0.0–3.5)
Myeloperoxidase Abs: <9 U/mL (ref 0.0–9.0)

## 2018-04-25 LAB — HEMOGLOBIN A1C
HEMOGLOBIN A1C: 6.8 % — AB (ref 4.8–5.6)
Mean Plasma Glucose: 148.46 mg/dL

## 2018-04-25 SURGERY — RIGHT HEART CATH
Anesthesia: LOCAL

## 2018-04-25 MED ORDER — HEPARIN (PORCINE) IN NACL 1000-0.9 UT/500ML-% IV SOLN
INTRAVENOUS | Status: DC | PRN
  Administered 2018-04-25: 500 mL

## 2018-04-25 MED ORDER — SPIRONOLACTONE 25 MG PO TABS
25.0000 mg | ORAL_TABLET | Freq: Every day | ORAL | Status: DC
  Administered 2018-04-25 – 2018-04-27 (×3): 25 mg via ORAL
  Filled 2018-04-25 (×3): qty 1

## 2018-04-25 MED ORDER — FENTANYL CITRATE (PF) 100 MCG/2ML IJ SOLN
INTRAMUSCULAR | Status: DC | PRN
  Administered 2018-04-25: 25 ug via INTRAVENOUS

## 2018-04-25 MED ORDER — HEPARIN (PORCINE) IN NACL 1000-0.9 UT/500ML-% IV SOLN
INTRAVENOUS | Status: AC
  Filled 2018-04-25: qty 500

## 2018-04-25 MED ORDER — MIDAZOLAM HCL 2 MG/2ML IJ SOLN
INTRAMUSCULAR | Status: DC | PRN
  Administered 2018-04-25: 1 mg via INTRAVENOUS

## 2018-04-25 MED ORDER — IBUPROFEN 200 MG PO TABS
400.0000 mg | ORAL_TABLET | Freq: Once | ORAL | Status: AC
  Administered 2018-04-25: 400 mg via ORAL
  Filled 2018-04-25: qty 2

## 2018-04-25 MED ORDER — FENTANYL CITRATE (PF) 100 MCG/2ML IJ SOLN
INTRAMUSCULAR | Status: AC
  Filled 2018-04-25: qty 2

## 2018-04-25 MED ORDER — LIDOCAINE HCL (PF) 1 % IJ SOLN
INTRAMUSCULAR | Status: AC
  Filled 2018-04-25: qty 30

## 2018-04-25 MED ORDER — LIDOCAINE HCL (PF) 1 % IJ SOLN
INTRAMUSCULAR | Status: DC | PRN
  Administered 2018-04-25: 2 mL

## 2018-04-25 MED ORDER — MIDAZOLAM HCL 2 MG/2ML IJ SOLN
INTRAMUSCULAR | Status: AC
  Filled 2018-04-25: qty 2

## 2018-04-25 SURGICAL SUPPLY — 11 items
CATH SWAN GANZ 7F STRAIGHT (CATHETERS) ×1 IMPLANT
GLIDESHEATH SLENDER 7FR .021G (SHEATH) ×1 IMPLANT
KIT MICROPUNCTURE NIT STIFF (SHEATH) ×1 IMPLANT
PACK CARDIAC CATHETERIZATION (CUSTOM PROCEDURE TRAY) ×2 IMPLANT
SHEATH GLIDE SLENDER 4/5FR (SHEATH) IMPLANT
SHEATH PROBE COVER 6X72 (BAG) ×1 IMPLANT
TRANSDUCER W/STOPCOCK (MISCELLANEOUS) ×2 IMPLANT
TUBING ART PRESS 72  MALE/FEM (TUBING) ×1
TUBING ART PRESS 72 MALE/FEM (TUBING) IMPLANT
TUBING CIL FLEX 10 FLL-RA (TUBING) ×2 IMPLANT
WIRE EMERALD 3MM-J .025X260CM (WIRE) ×1 IMPLANT

## 2018-04-25 NOTE — Research (Signed)
PIVA Informed Consent   Subject Name: [name]  Subject met inclusion and exclusion criteria.  The informed consent form, study requirements and expectations were reviewed with the subject and questions and concerns were addressed prior to the signing of the consent form.  The subject verbalized understanding of the trail requirements.  The subject agreed to participate in the PIVA trial and signed the informed consent.  The informed consent was obtained prior to performance of any protocol-specific procedures for the subject.  A copy of the signed informed consent was given to the subject and a copy was placed in the subject's medical record.  [name] 04/24/18 1545

## 2018-04-25 NOTE — Progress Notes (Signed)
PROGRESS NOTE    [name]  JGO:[id number] DOB: April 05, 1956 DOA: 04/22/2018 PCP: Patient, No Pcp Per  Brief Narrative: 62 year old female with history of severe pulmonary hypertension, diabetes type 2, COPD, 40+-pack-year history of smoking followed by cardiology at [facility] on torsemide 160 mg daily was recently seen by her outpatient providers on 12/9, felt to be fluid overloaded, metolazone was added to her medication regimen, reports diuresing well and significant improvement in her swelling since then. -She presented to our ED with productive cough, congestion, shortness of breath -Chest x-ray was unremarkable except for atelectasis at the bases, she was felt to be over diuresed and diuretics were held on admission, then slowly resumed PO -resp status improving with steroids and nebs -Cards following, ECHO with Mod-sev Pulm HTN, CHF team following, for R heart cath today  Assessment & Plan:     Acute respiratory failure with hypoxia ([place]) -Suspect bronchitis/COPD exacerbation in the background of advanced COPD, severe pulmonary hypertension and right-sided heart failure -Last echocardiogram was on 05/2017 outside hospital, unable to review this -Echo with PA pressures of 58, appreciate cardiology input, CHF team to see and determine need for right heart catheterization -Improving, was on IV steroids, transition to prednisone taper today  -Wean O2, restarted torsemide at 60 mg twice daily  Severe PAH with right-sided heart failure -I suspect this is secondary to COPD, OSA OHS -She was unable to tolerate a sleep study in the past recommended sleep study as outpatient -Clinically close to euvolemic but volume status difficult to assess, restarted torsemide 60 mg daily  -2D echocardiogram with EF of 55%, grade 1 diastolic dysfunction and PA systolic pressure of 58  -Appreciate heart failure team input, plan for right right heart cath today   Contraction alkalosis -Has chronically  elevated CO2 at baseline due to compensation for chronic respiratory acidosis -Does not appear dry to me at this time -CO2 has gone up but BUN and creatinine are improving, torsemide restarted  Diabetes mellitus with hyperglycemia -Follow-up hemoglobin A1c, metformin on hold, continue sliding scale  -CBGs relatively stable despite steroids  Tobacco abuse/suspected COPD -Needs pulmonary follow-up  Suspected sleep apnea -Recommended reevaluation for sleep study  DVT prophylaxis: Lovenox Code Status: Full code Family Communication: Spouse at bedside Disposition Plan: Home pending work-up and management per CHF team  Consultants:   Cards   Procedures:   Antimicrobials:    Subjective: -Breathing a little better, unable to be weaned off O2 yet, anxious and awaiting right heart catheterization   Objective: Vitals:   04/25/18 0938 04/25/18 0943 04/25/18 0948 04/25/18 0953  BP: 139/73 129/78 131/80 (!) 141/69  Pulse: 95 99 94 93  Resp: 10 (!) 4 20 17   Temp:      TempSrc:      SpO2: 95% 95% 94% 95%  Weight:      Height:        Intake/Output Summary (Last 24 hours) at 04/25/2018 1040 Last data filed at 04/25/2018 0610 Gross per 24 hour  Intake 3 ml  Output 1900 ml  Net -1897 ml   Filed Weights   04/23/18 0321 04/24/18 1148 04/25/18 0500  Weight: 103.4 kg 101.2 kg 100.1 kg    Examination: Gen: Peace anxious female, laying in bed awake, Alert, Oriented X 3,  HEENT: PERRLA, Neck supple, no JVD Lungs: Good air movement bilaterally, CTAB CVS: RRR,No Gallops,Rubs or new Murmurs Abd: soft, Non tender, non distended, BS present Extremities: Trace edema Skin: no new rashes Psychiatry: Judgement and insight  appear normal. Mood & affect appropriate.     Data Reviewed:   CBC: Recent Labs  Lab 04/22/18 1739 04/23/18 0243 04/24/18 0259  WBC 12.4* 12.2* 10.0  NEUTROABS 9.1*  --   --   HGB 16.6* 15.7* 15.5*  HCT 55.3* 53.2* 52.0*  MCV 87.2 87.2 87.7  PLT 331  344 932   Basic Metabolic Panel: Recent Labs  Lab 04/22/18 1739 04/23/18 0243 04/24/18 0259 04/25/18 0218  NA 131* 131* 136 139  K 3.3* 4.1 2.8* 3.4*  CL 68* 69* 74* 78*  CO2 41* 44* 46* 47*  GLUCOSE 136* 121* 177* 160*  BUN 45* 40* 30* 31*  CREATININE 1.27* 1.12* 0.99 1.08*  CALCIUM 9.7 9.1 8.9 9.0   GFR: Estimated Creatinine Clearance: 66.5 mL/min (A) (by C-G formula based on SCr of 1.08 mg/dL (H)). Liver Function Tests: Recent Labs  Lab 04/22/18 1739  AST 43*  ALT 17  ALKPHOS 88  BILITOT 3.2*  PROT 7.1  ALBUMIN 3.7   Recent Labs  Lab 04/22/18 1739  LIPASE 30   No results for input(s): AMMONIA in the last 168 hours. Coagulation Profile: No results for input(s): INR, PROTIME in the last 168 hours. Cardiac Enzymes: No results for input(s): CKTOTAL, CKMB, CKMBINDEX, TROPONINI in the last 168 hours. BNP (last 3 results) No results for input(s): PROBNP in the last 8760 hours. HbA1C: No results for input(s): HGBA1C in the last 72 hours. CBG: Recent Labs  Lab 04/24/18 0742 04/24/18 1119 04/24/18 1654 04/24/18 2158 04/25/18 0745  GLUCAP 174* 281* 157* 214* 160*   Lipid Profile: No results for input(s): CHOL, HDL, LDLCALC, TRIG, CHOLHDL, LDLDIRECT in the last 72 hours. Thyroid Function Tests: No results for input(s): TSH, T4TOTAL, FREET4, T3FREE, THYROIDAB in the last 72 hours. Anemia Panel: No results for input(s): VITAMINB12, FOLATE, FERRITIN, TIBC, IRON, RETICCTPCT in the last 72 hours. Urine analysis:    Component Value Date/Time   COLORURINE YELLOW 04/22/2018 1957   APPEARANCEUR CLEAR 04/22/2018 1957   LABSPEC 1.008 04/22/2018 1957   PHURINE 7.0 04/22/2018 1957   GLUCOSEU NEGATIVE 04/22/2018 1957   HGBUR NEGATIVE 04/22/2018 [place] NEGATIVE 04/22/2018 [place] NEGATIVE 04/22/2018 1957   PROTEINUR NEGATIVE 04/22/2018 1957   NITRITE NEGATIVE 04/22/2018 1957   LEUKOCYTESUR SMALL (A) 04/22/2018 1957   Sepsis  Labs: @LABRCNTIP (procalcitonin:4,lacticidven:4)  ) Recent Results (from the past 240 hour(s))  Blood culture (routine x 2)     Status: None (Preliminary result)   Collection Time: 04/22/18  6:47 PM  Result Value Ref Range Status   Specimen Description BLOOD RIGHT HAND  Final   Special Requests   Final    BOTTLES DRAWN AEROBIC AND ANAEROBIC Blood Culture results may not be optimal due to an inadequate volume of blood received in culture bottles   Culture   Final    NO GROWTH 3 DAYS Performed at [facility], [place] [address] [place], [place] [address]    Report Status PENDING  Incomplete  Blood culture (routine x 2)     Status: Abnormal   Collection Time: 04/22/18  6:48 PM  Result Value Ref Range Status   Specimen Description BLOOD LEFT WRIST  Final   Special Requests   Final    BOTTLES DRAWN AEROBIC AND ANAEROBIC Blood Culture adequate volume   Culture  Setup Time   Final    GRAM POSITIVE COCCI IN CLUSTERS AEROBIC BOTTLE ONLY CRITICAL RESULT CALLED TO, READ BACK BY AND VERIFIED WITH:  PHARMD DEJA, [id number] FCP    Culture (A)  Final    STAPHYLOCOCCUS SPECIES (COAGULASE NEGATIVE) THE SIGNIFICANCE OF ISOLATING THIS ORGANISM FROM A SINGLE SET OF BLOOD CULTURES WHEN MULTIPLE SETS ARE DRAWN IS UNCERTAIN. PLEASE NOTIFY THE MICROBIOLOGY DEPARTMENT WITHIN ONE WEEK IF SPECIATION AND SENSITIVITIES ARE REQUIRED. Performed at [facility], [place] [address] [place], [place] [address]    Report Status 04/24/2018 FINAL  Final  Blood Culture ID Panel (Reflexed)     Status: Abnormal   Collection Time: 04/22/18  6:48 PM  Result Value Ref Range Status   Enterococcus species NOT DETECTED NOT DETECTED Final   Listeria monocytogenes NOT DETECTED NOT DETECTED Final   Staphylococcus species DETECTED (A) NOT DETECTED Final    Comment: Methicillin (oxacillin) susceptible coagulase negative staphylococcus. Possible blood culture contaminant (unless isolated from more than one blood culture  draw or clinical case suggests pathogenicity). No antibiotic treatment is indicated for blood  culture contaminants. CRITICAL RESULT CALLED TO, READ BACK BY AND VERIFIED WITH: PHARMD DEJA, [id number] FCP    Staphylococcus aureus (BCID) NOT DETECTED NOT DETECTED Final   Methicillin resistance NOT DETECTED NOT DETECTED Final   Streptococcus species NOT DETECTED NOT DETECTED Final   Streptococcus agalactiae NOT DETECTED NOT DETECTED Final   Streptococcus pneumoniae NOT DETECTED NOT DETECTED Final   Streptococcus pyogenes NOT DETECTED NOT DETECTED Final   Acinetobacter baumannii NOT DETECTED NOT DETECTED Final   Enterobacteriaceae species NOT DETECTED NOT DETECTED Final   Enterobacter cloacae complex NOT DETECTED NOT DETECTED Final   Escherichia coli NOT DETECTED NOT DETECTED Final   Klebsiella oxytoca NOT DETECTED NOT DETECTED Final   Klebsiella pneumoniae NOT DETECTED NOT DETECTED Final   Proteus species NOT DETECTED NOT DETECTED Final   Serratia marcescens NOT DETECTED NOT DETECTED Final   Haemophilus influenzae NOT DETECTED NOT DETECTED Final   Neisseria meningitidis NOT DETECTED NOT DETECTED Final   Pseudomonas aeruginosa NOT DETECTED NOT DETECTED Final   Candida albicans NOT DETECTED NOT DETECTED Final   Candida glabrata NOT DETECTED NOT DETECTED Final   Candida krusei NOT DETECTED NOT DETECTED Final   Candida parapsilosis NOT DETECTED NOT DETECTED Final   Candida tropicalis NOT DETECTED NOT DETECTED Final    Comment: Performed at [facility], [address], [place], [place] [address]         Radiology Studies: No results found.      Scheduled Meds: . atorvastatin  40 mg Oral Daily  . enoxaparin (LOVENOX) injection  40 mg Subcutaneous Q24H  . escitalopram  20 mg Oral Daily  . insulin aspart  0-15 Units Subcutaneous TID WC  . insulin aspart  0-5 Units Subcutaneous QHS  . ipratropium-albuterol  3 mL Nebulization TID  . mometasone-formoterol  1 puff  Inhalation BID  . montelukast  10 mg Oral Daily  . potassium chloride  40 mEq Oral TID  . predniSONE  40 mg Oral Q breakfast  . spironolactone  25 mg Oral Daily  . torsemide  60 mg Oral BID   Continuous Infusions:   LOS: 3 days    Time spent: 66min    Domenic Polite, MD [facility] Page via [email or web address], password [email or web address] After 7PM please contact night-coverage  04/25/2018, 10:40 AM

## 2018-04-25 NOTE — Progress Notes (Signed)
PT Cancellation Note  Patient Details Name: [name] MRN: [id number] DOB: [birth year]   Cancelled Treatment:    Reason Eval/Treat Not Completed: Patient at procedure or test/unavailable Spoke to nursing - patient going to cath lab with transport present. Will follow up as time allows   Lanney Gins, PT, DPT Supplemental Physical Therapist 04/25/18 8:46 AM Pager: [phone] Office: [phone]

## 2018-04-25 NOTE — Interval H&P Note (Signed)
History and Physical Interval Note:  04/25/2018 9:23 AM  [name]  has presented today for surgery, with the diagnosis of hp  The various methods of treatment have been discussed with the patient and family. After consideration of risks, benefits and other options for treatment, the patient has consented to  Procedure(s): RIGHT HEART CATH (N/A) as a surgical intervention .  The patient's history has been reviewed, patient examined, no change in status, stable for surgery.  I have reviewed the patient's chart and labs.  Questions were answered to the patient's satisfaction.     [name]

## 2018-04-25 NOTE — Consult Note (Signed)
Advanced Heart Failure Team  Note   Primary Physician: Patient, No Pcp Per PCP-Cardiologist:  No primary care provider on file.  Reason for Consultation: Pulmonary HTN  Subjective:    Resting comfortably. Lying flat. No orthopnea or PND. No CP. Down another 3 pounds on po torsemide  Echo 04/23/18 LVEF 60-65%, Grade 1 DD, PA peak pressure 58 mm Hg.   Outside studies VQ scan 05/23/2017 ([facility])- Low probability of PE. Echo 05/23/17 ([facility]) - Not available. Per Note 04/17/18 -> "Normal LV function. 'Severe' pulmonary HTN" Echo 05/2015 ([facility]) - EF 60-65%, mild TR, Right Ventricular systolic pressure 14-43 mmHg, consistent with Mod/Sev pulmonary HTN. Normal RV size and function.   Medications: . atorvastatin  40 mg Oral Daily  . enoxaparin (LOVENOX) injection  40 mg Subcutaneous Q24H  . escitalopram  20 mg Oral Daily  . insulin aspart  0-15 Units Subcutaneous TID WC  . insulin aspart  0-5 Units Subcutaneous QHS  . ipratropium-albuterol  3 mL Nebulization TID  . mometasone-formoterol  1 puff Inhalation BID  . montelukast  10 mg Oral Daily  . potassium chloride  40 mEq Oral TID  . predniSONE  40 mg Oral Q breakfast  . sodium chloride flush  3 mL Intravenous Q12H  . sodium chloride flush  3 mL Intravenous Q12H  . torsemide  60 mg Oral BID    Objective:    Vital Signs:   Temp:  [97.6 F (36.4 C)-98 F (36.7 C)] 97.8 F (36.6 C) (12/16 2233) Pulse Rate:  [91-94] 91 (12/16 2233) Resp:  [14-15] 14 (12/16 1654) BP: (118-132)/(57-76) 129/76 (12/16 2233) SpO2:  [92 %-95 %] 92 % (12/16 2233) Weight:  [100.1 kg-101.2 kg] 100.1 kg (12/17 0500) Last BM Date: (PTA)  Weight change: Filed Weights   04/23/18 0321 04/24/18 1148 04/25/18 0500  Weight: 103.4 kg 101.2 kg 100.1 kg    Intake/Output:   Intake/Output Summary (Last 24 hours) at 04/25/2018 0650 Last data filed at 04/25/2018 0500 Gross per 24 hour  Intake 243 ml  Output 1900 ml  Net -1657 ml      Physical Exam    General:  Lying flat. No resp difficulty HEENT: normal Neck: supple. JVP hard to see 8 Carotids 2+ bilat; no bruits. No lymphadenopathy or thryomegaly appreciated. Cor: PMI nondisplaced. Regular rate & rhythm. No rubs, gallops or murmurs. Lungs: mildly decreased throughout Abdomen: obese soft, nontender, nondistended. No hepatosplenomegaly. No bruits or masses. Good bowel sounds. Extremities: no cyanosis, clubbing, rash, trace edema Neuro: alert & orientedx3, cranial nerves grossly intact. moves all 4 extremities w/o difficulty. Affect pleasant   Telemetry   NSR 90s, personally reviewed.   EKG    04/23/18 Sinus tachycardia 99 bpm, personally reviewed  Labs   Basic Metabolic Panel: Recent Labs  Lab 04/22/18 1739 04/23/18 0243 04/24/18 0259 04/25/18 0218  NA 131* 131* 136 139  K 3.3* 4.1 2.8* 3.4*  CL 68* 69* 74* 78*  CO2 41* 44* 46* 47*  GLUCOSE 136* 121* 177* 160*  BUN 45* 40* 30* 31*  CREATININE 1.27* 1.12* 0.99 1.08*  CALCIUM 9.7 9.1 8.9 9.0    Liver Function Tests: Recent Labs  Lab 04/22/18 1739  AST 43*  ALT 17  ALKPHOS 88  BILITOT 3.2*  PROT 7.1  ALBUMIN 3.7   Recent Labs  Lab 04/22/18 1739  LIPASE 30   No results for input(s): AMMONIA in the last 168 hours.  CBC: Recent Labs  Lab 04/22/18 1739 04/23/18 0243 04/24/18  0259  WBC 12.4* 12.2* 10.0  NEUTROABS 9.1*  --   --   HGB 16.6* 15.7* 15.5*  HCT 55.3* 53.2* 52.0*  MCV 87.2 87.2 87.7  PLT 331 344 318    Cardiac Enzymes: No results for input(s): CKTOTAL, CKMB, CKMBINDEX, TROPONINI in the last 168 hours.  BNP: BNP (last 3 results) Recent Labs    04/22/18 1739  BNP 501.9*    ProBNP (last 3 results) No results for input(s): PROBNP in the last 8760 hours.   CBG: Recent Labs  Lab 04/23/18 2131 04/24/18 0742 04/24/18 1119 04/24/18 1654 04/24/18 2158  GLUCAP 244* 174* 281* 157* 214*    Coagulation Studies: No results for input(s): LABPROT, INR in the last 72  hours.   Imaging   No results found.   Medications:     Current Medications: . atorvastatin  40 mg Oral Daily  . enoxaparin (LOVENOX) injection  40 mg Subcutaneous Q24H  . escitalopram  20 mg Oral Daily  . insulin aspart  0-15 Units Subcutaneous TID WC  . insulin aspart  0-5 Units Subcutaneous QHS  . ipratropium-albuterol  3 mL Nebulization TID  . mometasone-formoterol  1 puff Inhalation BID  . montelukast  10 mg Oral Daily  . potassium chloride  40 mEq Oral TID  . predniSONE  40 mg Oral Q breakfast  . sodium chloride flush  3 mL Intravenous Q12H  . sodium chloride flush  3 mL Intravenous Q12H  . torsemide  60 mg Oral BID    Infusions: . sodium chloride    . sodium chloride    . sodium chloride 10 mL/hr at 04/25/18 0610    Patient Profile   [name] is a 62 y.o. female with history of pulmonary HTN, Obesity, asthma, DM2, ADHD, and long smoking history.   Admitted 04/20/18 with volume overload.   Assessment/Plan   1. Pulmonary Hypertension by echo  - Suspect WHO group III disease (with probable component of WHO III) in setting of obesity, asthma, tobacco abuse, and suspected OSA.   - Continue torsemide 60 mg BID for now. Adjust further based on cath. She was on torsemide 150 mg DAILY, prior to admit. Should divide into BID dosing as above. - VQ low prob - RHC today -  Will also need repeat PFTs with DLCO and sleep study when improved.   2. Hypokalemia - K 3.4 this am. Will supp and add spiro 25  3. Obesity - Body mass index is 34.56 kg/m.  - Encouraged weight loss - Suspect OSA/OHS. Will need sleep study as outpatient.   4. DM2 - Per primary  5. ADHD - She is on Adderall chronically. Not ideal for patients with structural heart disease. Will discuss with MD.   6. Tobacco abuse - She is not a great historian, but states she is not currently smoking - Encouraged continued cessation.   7. Acute on chronic diastolic dysfunction - continue  diuresis  Length of Stay: 3  Glori Bickers, MD  04/25/2018, 6:50 AM  Advanced Heart Failure Team Pager [phone] (M-F; 7a - 4p)  Please contact [facility] for night-coverage after hours (4p -7a ) and weekends on [email or web address]

## 2018-04-25 NOTE — H&P (View-Only) (Signed)
Advanced Heart Failure Team  Note   Primary Physician: Patient, No Pcp Per PCP-Cardiologist:  No primary care provider on file.  Reason for Consultation: Pulmonary HTN  Subjective:    Resting comfortably. Lying flat. No orthopnea or PND. No CP. Down another 3 pounds on po torsemide  Echo 04/23/18 LVEF 60-65%, Grade 1 DD, PA peak pressure 58 mm Hg.   Outside studies VQ scan 05/23/2017 ([facility])- Low probability of PE. Echo 05/23/17 ([facility]) - Not available. Per Note 04/17/18 -> "Normal LV function. 'Severe' pulmonary HTN" Echo 05/2015 ([facility]) - EF 60-65%, mild TR, Right Ventricular systolic pressure 83-38 mmHg, consistent with Mod/Sev pulmonary HTN. Normal RV size and function.   Medications: . atorvastatin  40 mg Oral Daily  . enoxaparin (LOVENOX) injection  40 mg Subcutaneous Q24H  . escitalopram  20 mg Oral Daily  . insulin aspart  0-15 Units Subcutaneous TID WC  . insulin aspart  0-5 Units Subcutaneous QHS  . ipratropium-albuterol  3 mL Nebulization TID  . mometasone-formoterol  1 puff Inhalation BID  . montelukast  10 mg Oral Daily  . potassium chloride  40 mEq Oral TID  . predniSONE  40 mg Oral Q breakfast  . sodium chloride flush  3 mL Intravenous Q12H  . sodium chloride flush  3 mL Intravenous Q12H  . torsemide  60 mg Oral BID    Objective:    Vital Signs:   Temp:  [97.6 F (36.4 C)-98 F (36.7 C)] 97.8 F (36.6 C) (12/16 2233) Pulse Rate:  [91-94] 91 (12/16 2233) Resp:  [14-15] 14 (12/16 1654) BP: (118-132)/(57-76) 129/76 (12/16 2233) SpO2:  [92 %-95 %] 92 % (12/16 2233) Weight:  [100.1 kg-101.2 kg] 100.1 kg (12/17 0500) Last BM Date: (PTA)  Weight change: Filed Weights   04/23/18 0321 04/24/18 1148 04/25/18 0500  Weight: 103.4 kg 101.2 kg 100.1 kg    Intake/Output:   Intake/Output Summary (Last 24 hours) at 04/25/2018 0650 Last data filed at 04/25/2018 0500 Gross per 24 hour  Intake 243 ml  Output 1900 ml  Net -1657 ml      Physical Exam    General:  Lying flat. No resp difficulty HEENT: normal Neck: supple. JVP hard to see 8 Carotids 2+ bilat; no bruits. No lymphadenopathy or thryomegaly appreciated. Cor: PMI nondisplaced. Regular rate & rhythm. No rubs, gallops or murmurs. Lungs: mildly decreased throughout Abdomen: obese soft, nontender, nondistended. No hepatosplenomegaly. No bruits or masses. Good bowel sounds. Extremities: no cyanosis, clubbing, rash, trace edema Neuro: alert & orientedx3, cranial nerves grossly intact. moves all 4 extremities w/o difficulty. Affect pleasant   Telemetry   NSR 90s, personally reviewed.   EKG    04/23/18 Sinus tachycardia 99 bpm, personally reviewed  Labs   Basic Metabolic Panel: Recent Labs  Lab 04/22/18 1739 04/23/18 0243 04/24/18 0259 04/25/18 0218  NA 131* 131* 136 139  K 3.3* 4.1 2.8* 3.4*  CL 68* 69* 74* 78*  CO2 41* 44* 46* 47*  GLUCOSE 136* 121* 177* 160*  BUN 45* 40* 30* 31*  CREATININE 1.27* 1.12* 0.99 1.08*  CALCIUM 9.7 9.1 8.9 9.0    Liver Function Tests: Recent Labs  Lab 04/22/18 1739  AST 43*  ALT 17  ALKPHOS 88  BILITOT 3.2*  PROT 7.1  ALBUMIN 3.7   Recent Labs  Lab 04/22/18 1739  LIPASE 30   No results for input(s): AMMONIA in the last 168 hours.  CBC: Recent Labs  Lab 04/22/18 1739 04/23/18 0243 04/24/18  0259  WBC 12.4* 12.2* 10.0  NEUTROABS 9.1*  --   --   HGB 16.6* 15.7* 15.5*  HCT 55.3* 53.2* 52.0*  MCV 87.2 87.2 87.7  PLT 331 344 318    Cardiac Enzymes: No results for input(s): CKTOTAL, CKMB, CKMBINDEX, TROPONINI in the last 168 hours.  BNP: BNP (last 3 results) Recent Labs    04/22/18 1739  BNP 501.9*    ProBNP (last 3 results) No results for input(s): PROBNP in the last 8760 hours.   CBG: Recent Labs  Lab 04/23/18 2131 04/24/18 0742 04/24/18 1119 04/24/18 1654 04/24/18 2158  GLUCAP 244* 174* 281* 157* 214*    Coagulation Studies: No results for input(s): LABPROT, INR in the last 72  hours.   Imaging   No results found.   Medications:     Current Medications: . atorvastatin  40 mg Oral Daily  . enoxaparin (LOVENOX) injection  40 mg Subcutaneous Q24H  . escitalopram  20 mg Oral Daily  . insulin aspart  0-15 Units Subcutaneous TID WC  . insulin aspart  0-5 Units Subcutaneous QHS  . ipratropium-albuterol  3 mL Nebulization TID  . mometasone-formoterol  1 puff Inhalation BID  . montelukast  10 mg Oral Daily  . potassium chloride  40 mEq Oral TID  . predniSONE  40 mg Oral Q breakfast  . sodium chloride flush  3 mL Intravenous Q12H  . sodium chloride flush  3 mL Intravenous Q12H  . torsemide  60 mg Oral BID    Infusions: . sodium chloride    . sodium chloride    . sodium chloride 10 mL/hr at 04/25/18 0610    Patient Profile   Rahma P Burston is a 62 y.o. female with history of pulmonary HTN, Obesity, asthma, DM2, ADHD, and long smoking history.   Admitted 04/20/18 with volume overload.   Assessment/Plan   1. Pulmonary Hypertension by echo  - Suspect WHO group III disease (with probable component of WHO III) in setting of obesity, asthma, tobacco abuse, and suspected OSA.   - Continue torsemide 60 mg BID for now. Adjust further based on cath. She was on torsemide 150 mg DAILY, prior to admit. Should divide into BID dosing as above. - VQ low prob - RHC today -  Will also need repeat PFTs with DLCO and sleep study when improved.   2. Hypokalemia - K 3.4 this am. Will supp and add spiro 25  3. Obesity - Body mass index is 34.56 kg/m.  - Encouraged weight loss - Suspect OSA/OHS. Will need sleep study as outpatient.   4. DM2 - Per primary  5. ADHD - She is on Adderall chronically. Not ideal for patients with structural heart disease. Will discuss with MD.   6. Tobacco abuse - She is not a great historian, but states she is not currently smoking - Encouraged continued cessation.   7. Acute on chronic diastolic dysfunction - continue  diuresis  Length of Stay: 3  Glori Bickers, MD  04/25/2018, 6:50 AM  Advanced Heart Failure Team Pager [phone] (M-F; 7a - 4p)  Please contact [facility] for night-coverage after hours (4p -7a ) and weekends on [email or web address]

## 2018-04-26 LAB — RHEUMATOID FACTOR: Rheumatoid fact SerPl-aCnc: 10.3 IU/mL (ref 0.0–13.9)

## 2018-04-26 LAB — GLUCOSE, CAPILLARY
GLUCOSE-CAPILLARY: 118 mg/dL — AB (ref 70–99)
Glucose-Capillary: 161 mg/dL — ABNORMAL HIGH (ref 70–99)
Glucose-Capillary: 162 mg/dL — ABNORMAL HIGH (ref 70–99)
Glucose-Capillary: 358 mg/dL — ABNORMAL HIGH (ref 70–99)

## 2018-04-26 LAB — BASIC METABOLIC PANEL
Anion gap: 14 (ref 5–15)
BUN: 30 mg/dL — ABNORMAL HIGH (ref 8–23)
CO2: 42 mmol/L — ABNORMAL HIGH (ref 22–32)
Calcium: 9.7 mg/dL (ref 8.9–10.3)
Chloride: 82 mmol/L — ABNORMAL LOW (ref 98–111)
Creatinine, Ser: 1.15 mg/dL — ABNORMAL HIGH (ref 0.44–1.00)
GFR calc Af Amer: 59 mL/min — ABNORMAL LOW (ref >60)
GFR calc non Af Amer: 51 mL/min — ABNORMAL LOW (ref >60)
Glucose, Bld: 170 mg/dL — ABNORMAL HIGH (ref 70–99)
POTASSIUM: 3.4 mmol/L — AB (ref 3.5–5.1)
Sodium: 138 mmol/L (ref 135–145)

## 2018-04-26 LAB — CULTURE, BLOOD (ROUTINE X 2): SPECIAL REQUESTS: ADEQUATE

## 2018-04-26 LAB — ANA W/REFLEX IF POSITIVE: Anti Nuclear Antibody(ANA): NEGATIVE

## 2018-04-26 LAB — ANCA TITERS
Atypical P-ANCA titer: <1:20 {titer}
C-ANCA: <1:20 {titer}

## 2018-04-26 MED ORDER — HYDROCODONE-ACETAMINOPHEN 5-325 MG PO TABS
2.0000 | ORAL_TABLET | Freq: Once | ORAL | Status: AC
  Administered 2018-04-26: 2 via ORAL
  Filled 2018-04-26: qty 2

## 2018-04-26 MED ORDER — SODIUM CHLORIDE 0.9 % IV SOLN
5.0000 10*6.[IU] | INTRAVENOUS | Status: DC
  Administered 2018-04-26 – 2018-04-27 (×4): 5 10*6.[IU] via INTRAVENOUS
  Filled 2018-04-26 (×6): qty 5

## 2018-04-26 NOTE — Progress Notes (Signed)
Inpatient Diabetes Program Recommendations  AACE/ADA: New Consensus Statement on Inpatient Glycemic Control (2015)  Target Ranges:  Prepandial:   less than 140 mg/dL      Peak postprandial:   less than 180 mg/dL (1-2 hours)      Critically ill patients:  140 - 180 mg/dL   Lab Results  Component Value Date   GLUCAP 161 (H) 04/26/2018   HGBA1C 6.8 (H) 04/25/2018    Review of Glycemic Control Results for DEAZIA, LAMPI (MRN [id number]) as of 04/26/2018 10:09  Ref. Range 04/25/2018 07:45 04/25/2018 12:18 04/25/2018 15:58 04/25/2018 21:13 04/26/2018 07:27  Glucose-Capillary Latest Ref Range: 70 - 99 mg/dL 160 (H) 215 (H) 221 (H) 312 (H) 161 (H)   Diabetes history: DM2 Outpatient Diabetes medications: Metformin 500 mg bid Current orders for Inpatient glycemic control: Metformin 500 mg bid + Novolog moderate correction tid + hs  Inpatient Diabetes Program Recommendations:   Noted postprandial hyperglycemia. While on steroids and off of Metformin: -Novolog 3 units tid meal coverage if eats 50%  Thank you, Bethena Roys E. Alben Jepsen, RN, MSN, CDE  Diabetes Coordinator Inpatient Glycemic Control Team Team Pager [phone] (8am-5pm) 04/26/2018 10:10 AM

## 2018-04-26 NOTE — Care Management Note (Addendum)
Case Management Note  Patient Details  Name: [name] MRN: [id number] Date of Birth: 1956-03-08  Subjective/Objective:   From home with spouse, she will have pulmonary rehab outpatient per consult from MD and recommendation from cards cath. Patient states she can not afford any other [facility] services.                  Action/Plan: DC home when ready.   Expected Discharge Date:                  Expected Discharge Plan:  OP Rehab  In-House Referral:     Discharge planning Services  CM Consult  Post Acute Care Choice:    Choice offered to:     DME Arranged:    DME Agency:     HH Arranged:   RN  [place] Agency:   [facility]  Status of Service:  Completed, signed off  If discussed at [place] of Stay Meetings, dates discussed:    Additional Comments:  Zenon Mayo, RN 04/26/2018, 3:00 PM

## 2018-04-26 NOTE — Progress Notes (Signed)
Physical Therapy Treatment Patient Details Name: [name] MRN: [id number] DOB: [birth year] Today's Date: 04/26/2018    History of Present Illness 62 y.o. female admitted with acute respiratory failure with hypoxia thought to be due to bronchitis/COPD exacerbation in setting of advanced COPD, pulmonary HTN, and R sided HF.     PT Comments    Patient progressing well with therapy ambulating further distances with less assistance. De-sats on RA and up to 4L with activity, requires 6L  To remain in 90's with walking. RN/MD aware.   SATURATION QUALIFICATIONS: (This note is used to comply with regulatory documentation for home oxygen)  Patient Saturations on Room Air at Rest = 87%  Patient Saturations on Room Air while Ambulating = 80%  Patient Saturations on 6 Liters of oxygen while Ambulating = 92%  Please briefly explain why patient needs home oxygen: pt desats at rest and with activity on room air.    Follow Up Recommendations  Home health PT;Supervision for mobility/OOB     Equipment Recommendations       Recommendations for Other Services       Precautions / Restrictions Restrictions Weight Bearing Restrictions: No    Mobility  Bed Mobility Overal bed mobility: Independent                Transfers Overall transfer level: Modified independent Equipment used: None                Ambulation/Gait Ambulation/Gait assistance: Supervision;Min guard Gait Distance (Feet): 300 Feet Assistive device: None;1 person hand held assist Gait Pattern/deviations: Step-through pattern Gait velocity: decreased   General Gait Details: pt de-satting on RA to 80 and on 2-4 Ls, 6 L with activity keeps patient in the 90s.    Stairs             Wheelchair Mobility    Modified Rankin (Stroke Patients Only)       Balance     Sitting balance-Leahy Scale: Good       Standing balance-Leahy Scale: Fair                               Cognition Arousal/Alertness: Lethargic Behavior During Therapy: WFL for tasks assessed/performed Overall Cognitive Status: Within Functional Limits for tasks assessed                                 General Comments: Functional although limited reasoning noted      Exercises      General Comments        Pertinent Vitals/Pain Pain Assessment: No/denies pain    Home Living                      Prior Function            PT Goals (current goals can now be found in the care plan section) Acute Rehab PT Goals Patient Stated Goal: to rest PT Goal Formulation: With patient Time For Goal Achievement: 05/07/18 Potential to Achieve Goals: Fair Progress towards PT goals: Progressing toward goals    Frequency    Min 3X/week      PT Plan Current plan remains appropriate    Co-evaluation              AM-PAC PT "6 Clicks" Mobility   Outcome Measure  Help needed turning from your back to  your side while in a flat bed without using bedrails?: None Help needed moving from lying on your back to sitting on the side of a flat bed without using bedrails?: None Help needed moving to and from a bed to a chair (including a wheelchair)?: A Little Help needed standing up from a chair using your arms (e.g., wheelchair or bedside chair)?: A Little Help needed to walk in hospital room?: A Little Help needed climbing 3-5 steps with a railing? : A Little 6 Click Score: 20    End of Session Equipment Utilized During Treatment: Gait belt;Oxygen Activity Tolerance: Patient limited by fatigue Patient left: in bed;with call bell/phone within reach;with family/visitor present Nurse Communication: Mobility status PT Visit Diagnosis: Unsteadiness on feet (R26.81)     Time: 1000-1015 PT Time Calculation (min) (ACUTE ONLY): 15 min  Charges:  $Gait Training: 8-22 mins                    Reinaldo Berber, PT, DPT Acute Rehabilitation Services Pager:  [phone] Office: [place] 04/26/2018, 12:02 PM

## 2018-04-26 NOTE — Progress Notes (Signed)
Occupational Therapy Treatment Patient Details Name: [name] MRN: [id number] DOB: August 03, 1955 Today's Date: 04/26/2018    History of present illness 62 y.o. female admitted with acute respiratory failure with hypoxia thought to be due to bronchitis/COPD exacerbation in setting of advanced COPD, pulmonary HTN, and R sided HF.    OT comments  Pt progressing towards established OT goals. Pt performing grooming and functional mobility with supervision. Pt SpO2 96% at rest and 93% during grooming on 2L O2. Provided education and handout on energy conservation and facilitated discussion of ways to implement in ADLs and IADLs. Continue to recommend dc home once medically stable and will continue to follow acutely as admitted.   Of note: pt becoming upset during session after phone call from her son, Mitzi Hansen. Son with questions about patient's job and she began to cry about loosing her job. Listening to pt and providing comfort. Nursing notified.    Follow Up Recommendations  No OT follow up;Supervision/Assistance - 24 hour    Equipment Recommendations  None recommended by OT    Recommendations for Other Services      Precautions / Restrictions Restrictions Weight Bearing Restrictions: No       Mobility Bed Mobility Overal bed mobility: Independent                Transfers Overall transfer level: Modified independent Equipment used: None                  Balance Overall balance assessment: Needs assistance   Sitting balance-Leahy Scale: Good       Standing balance-Leahy Scale: Fair                             ADL either performed or assessed with clinical judgement   ADL Overall ADL's : Needs assistance/impaired     Grooming: Supervision/safety;Set up;Standing;Oral care Grooming Details (indicate cue type and reason): Pt fatigued quickly                 Toilet Transfer: Ambulation;Supervision/safety(simulated to recliner)            Functional mobility during ADLs: Supervision/safety General ADL Comments: Pt with decreased activity tolerance due to fatigue. Provided education and handout on EC and reviewed in full. Discussed ways pt can impliment techniques into her ADLs and IADLs; pt verbalized understanding.      Vision       Perception     Praxis      Cognition Arousal/Alertness: Awake/alert Behavior During Therapy: WFL for tasks assessed/performed;Anxious Overall Cognitive Status: Within Functional Limits for tasks assessed                                 General Comments: Pt appropiate during begining of session and grooming. Pt receiving a call form her son about her job during session and then began crying with difficulty calming herself.         Exercises     Shoulder Instructions       General Comments Husband present at beginning of session. SpO2 96% on rest on 2L and 93% during grooming on 2L    Pertinent Vitals/ Pain       Pain Assessment: Faces Faces Pain Scale: Hurts little more Pain Location: bilateral feet/lower extremities Pain Descriptors / Indicators: Aching;Discomfort Pain Intervention(s): Monitored during session;Repositioned  Home Living  Prior Functioning/Environment              Frequency  Min 2X/week        Progress Toward Goals  OT Goals(current goals can now be found in the care plan section)  Progress towards OT goals: Progressing toward goals  Acute Rehab OT Goals Patient Stated Goal: to rest OT Goal Formulation: With patient/family Time For Goal Achievement: 05/07/18 Potential to Achieve Goals: Good ADL Goals Pt Will Perform Lower Body Dressing: with modified independence;sit to/from stand Pt Will Transfer to Toilet: with modified independence;regular height toilet Pt Will Perform Toileting - Clothing Manipulation and hygiene: with modified independence;sit to/from stand Pt  Will Perform Tub/Shower Transfer: with modified independence;ambulating;shower seat Additional ADL Goal #1: Pt will verbalize 3 strategies to conserve energy during daily ADL routine.  Plan Discharge plan remains appropriate    Co-evaluation                 AM-PAC OT "6 Clicks" Daily Activity     Outcome Measure   Help from another person eating meals?: None Help from another person taking care of personal grooming?: None Help from another person toileting, which includes using toliet, bedpan, or urinal?: A Little Help from another person bathing (including washing, rinsing, drying)?: A Lot Help from another person to put on and taking off regular upper body clothing?: None Help from another person to put on and taking off regular lower body clothing?: A Lot 6 Click Score: 19    End of Session Equipment Utilized During Treatment: Oxygen(2L)  OT Visit Diagnosis: Other abnormalities of gait and mobility (R26.89);Pain Pain - part of body: Leg   Activity Tolerance Patient tolerated treatment well   Patient Left in bed;with call bell/phone within reach   Nurse Communication Mobility status        Time: 3338-3291 OT Time Calculation (min): 27 min  Charges: OT General Charges $OT Visit: 1 Visit OT Treatments $Self Care/Home Management : 23-37 mins  [place], OTR/L Acute Rehab Pager: [phone] Office: [place] 04/26/2018, 5:45 PM

## 2018-04-26 NOTE — Progress Notes (Signed)
PROGRESS NOTE    [name]  WUJ:[id number] DOB: Nov 22, 1955 DOA: 04/22/2018 PCP: Patient, No Pcp Per  Brief Narrative: 62 year old female with history of severe pulmonary hypertension, diabetes type 2, COPD, 40+-pack-year history of smoking followed by cardiology at [facility] on torsemide 160 mg daily was recently seen by her outpatient providers on 12/9, felt to be fluid overloaded, metolazone was added to her medication regimen, reports diuresing well and significant improvement in her swelling since then. -She presented to our ED with productive cough, congestion, shortness of breath -Chest x-ray was unremarkable except for atelectasis at the bases, she was felt to be over diuresed and diuretics were held on admission, then slowly resumed PO -resp status improving with steroids and nebs -Cards following, ECHO with Mod-sev Pulm HTN, CHF team following,   Assessment & Plan:     Acute respiratory failure with hypoxia ([place]) -Suspect bronchitis/COPD exacerbation in the background of advanced COPD, severe pulmonary hypertension and right-sided heart failure -Last echocardiogram was on 05/2017 outside hospital, unable to review this -Echo with PA pressures of 58, appreciate cardiology input, CHF team to see and determine need for right heart catheterization -Improving, was on IV steroids, transition to prednisone taper -Wean O2, restarted torsemide at 60 mg twice daily -Will need outpatient PFT as well as sleep apnea study as well as referral to pulmonary.  Severe PAH with right-sided heart failure -I suspect this is secondary to COPD, OSA OHS -She was unable to tolerate a sleep study in the past recommended sleep study as outpatient -Clinically close to euvolemic but volume status difficult to assess, restarted torsemide 60 mg daily  -2D echocardiogram with EF of 95%, grade 1 diastolic dysfunction and PA systolic pressure of 58  -Appreciate heart failure team input,  -Underwent right heart  cath which did shows moderate pulmonary hypertension WHO class II or III  Actinomyces and blood culture. 1 out of 2 blood culture is positive for actinomyces as well as coagulase-negative staph. Discussed with infectious disease, recommend to use high-dose penicillin empirically for now. Also recommended to perform CT maxillofacial as well as CT chest contrast to identify potential source of infection. We will formally consult tomorrow.  Contraction alkalosis -Has chronically elevated CO2 at baseline due to compensation for chronic respiratory acidosis -Does not appear dry to me at this time -CO2 has gone up but BUN and creatinine are improving, torsemide restarted  Diabetes mellitus with hyperglycemia -Follow-up hemoglobin A1c, metformin on hold, continue sliding scale  -CBGs relatively stable despite steroids  Tobacco abuse/suspected COPD -Needs pulmonary follow-up  Suspected sleep apnea -Recommended reevaluation for sleep study  DVT prophylaxis: Lovenox Code Status: Full code Family Communication: Spouse at bedside Disposition Plan: Home pending work-up and management per CHF team  Consultants:   Cards   Procedures:   Antimicrobials:    Subjective: On 6 L of oxygen.  No nausea no vomiting.  Has cough and swelling in the leg.   Objective: Vitals:   04/26/18 0723 04/26/18 0915 04/26/18 1414 04/26/18 1725  BP:  (!) 104/55  108/62  Pulse:  91  89  Resp:  (!) 22  20  Temp:  98.3 F (36.8 C)  97.8 F (36.6 C)  TempSrc:  Oral    SpO2: (!) 73% 94% 95% 98%  Weight:      Height:        Intake/Output Summary (Last 24 hours) at 04/26/2018 1907 Last data filed at 04/26/2018 0900 Gross per 24 hour  Intake 360 ml  Output 900 ml  Net -540 ml   Filed Weights   04/24/18 1148 04/25/18 0500 04/26/18 0629  Weight: 101.2 kg 100.1 kg 100.1 kg    Examination: Gen: Peace anxious female, laying in bed awake, Alert, Oriented X 3,  HEENT: PERRLA, Neck supple, no  JVD Lungs: Good air movement bilaterally, CTAB CVS: RRR,No Gallops,Rubs or new Murmurs Abd: soft, Non tender, non distended, BS present Extremities: Trace edema Skin: no new rashes Psychiatry: Judgement and insight appear normal. Mood & affect appropriate.     Data Reviewed:   CBC: Recent Labs  Lab 04/22/18 1739 04/23/18 0243 04/24/18 0259  WBC 12.4* 12.2* 10.0  NEUTROABS 9.1*  --   --   HGB 16.6* 15.7* 15.5*  HCT 55.3* 53.2* 52.0*  MCV 87.2 87.2 87.7  PLT 331 344 998   Basic Metabolic Panel: Recent Labs  Lab 04/22/18 1739 04/23/18 0243 04/24/18 0259 04/25/18 0218 04/26/18 0223  NA 131* 131* 136 139 138  K 3.3* 4.1 2.8* 3.4* 3.4*  CL 68* 69* 74* 78* 82*  CO2 41* 44* 46* 47* 42*  GLUCOSE 136* 121* 177* 160* 170*  BUN 45* 40* 30* 31* 30*  CREATININE 1.27* 1.12* 0.99 1.08* 1.15*  CALCIUM 9.7 9.1 8.9 9.0 9.7   GFR: Estimated Creatinine Clearance: 62.4 mL/min (A) (by C-G formula based on SCr of 1.15 mg/dL (H)). Liver Function Tests: Recent Labs  Lab 04/22/18 1739  AST 43*  ALT 17  ALKPHOS 88  BILITOT 3.2*  PROT 7.1  ALBUMIN 3.7   Recent Labs  Lab 04/22/18 1739  LIPASE 30   No results for input(s): AMMONIA in the last 168 hours. Coagulation Profile: No results for input(s): INR, PROTIME in the last 168 hours. Cardiac Enzymes: No results for input(s): CKTOTAL, CKMB, CKMBINDEX, TROPONINI in the last 168 hours. BNP (last 3 results) No results for input(s): PROBNP in the last 8760 hours. HbA1C: Recent Labs    04/25/18 1103  HGBA1C 6.8*   CBG: Recent Labs  Lab 04/25/18 1558 04/25/18 2113 04/26/18 0727 04/26/18 1204 04/26/18 1649  GLUCAP 221* 312* 161* 162* 358*   Lipid Profile: No results for input(s): CHOL, HDL, LDLCALC, TRIG, CHOLHDL, LDLDIRECT in the last 72 hours. Thyroid Function Tests: No results for input(s): TSH, T4TOTAL, FREET4, T3FREE, THYROIDAB in the last 72 hours. Anemia Panel: No results for input(s): VITAMINB12, FOLATE,  FERRITIN, TIBC, IRON, RETICCTPCT in the last 72 hours. Urine analysis:    Component Value Date/Time   COLORURINE YELLOW 04/22/2018 1957   APPEARANCEUR CLEAR 04/22/2018 1957   LABSPEC 1.008 04/22/2018 1957   PHURINE 7.0 04/22/2018 1957   GLUCOSEU NEGATIVE 04/22/2018 1957   HGBUR NEGATIVE 04/22/2018 [place] NEGATIVE 04/22/2018 [place] NEGATIVE 04/22/2018 1957   PROTEINUR NEGATIVE 04/22/2018 1957   NITRITE NEGATIVE 04/22/2018 1957   LEUKOCYTESUR SMALL (A) 04/22/2018 1957   Sepsis Labs: @LABRCNTIP (procalcitonin:4,lacticidven:4)  ) Recent Results (from the past 240 hour(s))  Blood culture (routine x 2)     Status: None (Preliminary result)   Collection Time: 04/22/18  6:47 PM  Result Value Ref Range Status   Specimen Description BLOOD RIGHT HAND  Final   Special Requests   Final    BOTTLES DRAWN AEROBIC AND ANAEROBIC Blood Culture results may not be optimal due to an inadequate volume of blood received in culture bottles   Culture   Final    NO GROWTH 4 DAYS Performed at [facility], [place] [address], [place], [place]  [address]    Report Status PENDING  Incomplete  Blood culture (routine x 2)     Status: Abnormal   Collection Time: 04/22/18  6:48 PM  Result Value Ref Range Status   Specimen Description BLOOD LEFT WRIST  Final   Special Requests   Final    BOTTLES DRAWN AEROBIC AND ANAEROBIC Blood Culture adequate volume   Culture  Setup Time   Final    GRAM POSITIVE COCCI IN CLUSTERS AEROBIC BOTTLE ONLY CRITICAL RESULT CALLED TO, READ BACK BY AND VERIFIED WITH: [place], [id number] FCP GRAM POSITIVE RODS ANAEROBIC BOTTLE ONLY CRITICAL RESULT CALLED TO, READ BACK BY AND VERIFIED WITH: PHARMD N BATCHELDER 04/26/18 AT 24 BY CM    Culture (A)  Final    STAPHYLOCOCCUS SPECIES (COAGULASE NEGATIVE) THE SIGNIFICANCE OF ISOLATING THIS ORGANISM FROM A SINGLE SET OF BLOOD CULTURES WHEN MULTIPLE SETS ARE DRAWN IS UNCERTAIN. PLEASE NOTIFY THE  MICROBIOLOGY DEPARTMENT WITHIN ONE WEEK IF SPECIATION AND SENSITIVITIES ARE REQUIRED. ACTINOMYCES NEUII Standardized susceptibility testing for this organism is not available. Performed at [facility], [place] [address], [place], [place] [address]    Report Status 04/24/2018 FINAL  Final  Blood Culture ID Panel (Reflexed)     Status: Abnormal   Collection Time: 04/22/18  6:48 PM  Result Value Ref Range Status   Enterococcus species NOT DETECTED NOT DETECTED Final   Listeria monocytogenes NOT DETECTED NOT DETECTED Final   Staphylococcus species DETECTED (A) NOT DETECTED Final    Comment: Methicillin (oxacillin) susceptible coagulase negative staphylococcus. Possible blood culture contaminant (unless isolated from more than one blood culture draw or clinical case suggests pathogenicity). No antibiotic treatment is indicated for blood  culture contaminants. CRITICAL RESULT CALLED TO, READ BACK BY AND VERIFIED WITH: PHARMD DEJA, [id number] FCP    Staphylococcus aureus (BCID) NOT DETECTED NOT DETECTED Final   Methicillin resistance NOT DETECTED NOT DETECTED Final   Streptococcus species NOT DETECTED NOT DETECTED Final   Streptococcus agalactiae NOT DETECTED NOT DETECTED Final   Streptococcus pneumoniae NOT DETECTED NOT DETECTED Final   Streptococcus pyogenes NOT DETECTED NOT DETECTED Final   Acinetobacter baumannii NOT DETECTED NOT DETECTED Final   Enterobacteriaceae species NOT DETECTED NOT DETECTED Final   Enterobacter cloacae complex NOT DETECTED NOT DETECTED Final   Escherichia coli NOT DETECTED NOT DETECTED Final   Klebsiella oxytoca NOT DETECTED NOT DETECTED Final   Klebsiella pneumoniae NOT DETECTED NOT DETECTED Final   Proteus species NOT DETECTED NOT DETECTED Final   Serratia marcescens NOT DETECTED NOT DETECTED Final   Haemophilus influenzae NOT DETECTED NOT DETECTED Final   Neisseria meningitidis NOT DETECTED NOT DETECTED Final   Pseudomonas aeruginosa NOT DETECTED NOT  DETECTED Final   Candida albicans NOT DETECTED NOT DETECTED Final   Candida glabrata NOT DETECTED NOT DETECTED Final   Candida krusei NOT DETECTED NOT DETECTED Final   Candida parapsilosis NOT DETECTED NOT DETECTED Final   Candida tropicalis NOT DETECTED NOT DETECTED Final    Comment: Performed at [facility], [address] [place], [place] [address]         Radiology Studies: No results found.      Scheduled Meds: . atorvastatin  40 mg Oral Daily  . enoxaparin (LOVENOX) injection  40 mg Subcutaneous Q24H  . escitalopram  20 mg Oral Daily  . insulin aspart  0-15 Units Subcutaneous TID WC  . insulin aspart  0-5 Units Subcutaneous QHS  . ipratropium-albuterol  3  mL Nebulization TID  . mometasone-formoterol  1 puff Inhalation BID  . montelukast  10 mg Oral Daily  . potassium chloride  40 mEq Oral TID  . predniSONE  40 mg Oral Q breakfast  . spironolactone  25 mg Oral Daily  . torsemide  60 mg Oral BID   Continuous Infusions: . pencillin G potassium IV       LOS: 4 days    Time spent: 6min    [place] [facility] Page via [email or web address], password [email or web address] After 7PM please contact night-coverage  04/26/2018, 7:07 PM

## 2018-04-26 NOTE — Progress Notes (Signed)
Pt on RA with spO2 73%.  Placed pt on 4lpm, spO2 increased to 91%, RN aware.

## 2018-04-26 NOTE — Progress Notes (Signed)
Progress Note  Patient Name: [name] Date of Encounter: 04/26/2018  Primary Cardiologist: No primary care provider on file. [facility]  Subjective   Walked with PT.  Had to put O2 back on.  Reports she had a negative sleep study many years ago and can't afford a CPAP if she needs one.   Inpatient Medications    Scheduled Meds: . atorvastatin  40 mg Oral Daily  . enoxaparin (LOVENOX) injection  40 mg Subcutaneous Q24H  . escitalopram  20 mg Oral Daily  . insulin aspart  0-15 Units Subcutaneous TID WC  . insulin aspart  0-5 Units Subcutaneous QHS  . ipratropium-albuterol  3 mL Nebulization TID  . mometasone-formoterol  1 puff Inhalation BID  . montelukast  10 mg Oral Daily  . potassium chloride  40 mEq Oral TID  . predniSONE  40 mg Oral Q breakfast  . spironolactone  25 mg Oral Daily  . torsemide  60 mg Oral BID   Continuous Infusions:  PRN Meds: acetaminophen **OR** acetaminophen, albuterol, HYDROcodone-acetaminophen, ondansetron **OR** ondansetron (ZOFRAN) IV, polyethylene glycol, zolpidem   Vital Signs    Vitals:   04/25/18 2120 04/26/18 0629 04/26/18 0723 04/26/18 0915  BP: (!) 114/53   (!) 104/55  Pulse: (!) 105   91  Resp: (!) 24   (!) 22  Temp: 97.8 F (36.6 C)   98.3 F (36.8 C)  TempSrc: Oral   Oral  SpO2: (!) 83%  (!) 73% 94%  Weight:  100.1 kg    Height:        Intake/Output Summary (Last 24 hours) at 04/26/2018 1205 Last data filed at 04/26/2018 0900 Gross per 24 hour  Intake 360 ml  Output 900 ml  Net -540 ml   Filed Weights   04/24/18 1148 04/25/18 0500 04/26/18 0629  Weight: 101.2 kg 100.1 kg 100.1 kg    Telemetry    Sinus rhythm, sinus tachycardia.  PVCs- Personally Reviewed  ECG    n/a - Personally Reviewed  Physical Exam   VS:  BP (!) 104/55   Pulse 91   Temp 98.3 F (36.8 C) (Oral)   Resp (!) 22   Ht 5\' 7"  (1.702 m) Comment: pt stated  Wt 100.1 kg   SpO2 94%   BMI 34.56 kg/m  , BMI Body mass index is 34.56  kg/m. GENERAL: Chronically ill-appearing. Lying flat in bed.  No acute distress. HEENT: Pupils equal round and reactive, fundi not visualized, oral mucosa unremarkable NECK:  jugular venous distention difficult to assess, carotid upstroke brisk and symmetric, no bruits, LUNGS:  Clear to auscultation bilaterally HEART:  RRR.  PMI not displaced or sustained,S1 and S2 within normal limits, no S3, no S4, no clicks, no rubs, no murmurs ABD:  Flat, positive bowel sounds normal in frequency in pitch, no bruits, no rebound, no guarding, no midline pulsatile mass, no hepatomegaly, no splenomegaly EXT:  2 plus pulses throughout, 1+ LE lower edema, no cyanosis no clubbing SKIN:  Bilateral LE erythema NEURO:  Cranial nerves II through XII grossly intact, motor grossly intact throughout [facility]:  Cognitively intact, oriented to person place and time   Labs    Chemistry Recent Labs  Lab 04/22/18 1739  04/24/18 0259 04/25/18 0218 04/26/18 0223  NA 131*   < > 136 139 138  K 3.3*   < > 2.8* 3.4* 3.4*  CL 68*   < > 74* 78* 82*  CO2 41*   < > 46* 47* 42*  GLUCOSE 136*   < > 177* 160* 170*  BUN 45*   < > 30* 31* 30*  CREATININE 1.27*   < > 0.99 1.08* 1.15*  CALCIUM 9.7   < > 8.9 9.0 9.7  PROT 7.1  --   --   --   --   ALBUMIN 3.7  --   --   --   --   AST 43*  --   --   --   --   ALT 17  --   --   --   --   ALKPHOS 88  --   --   --   --   BILITOT 3.2*  --   --   --   --   GFRNONAA 46*   < > >60 55* 51*  GFRAA 53*   < > >60 >60 59*  ANIONGAP 22*   < > 16* 14 14   < > = values in this interval not displayed.     Hematology Recent Labs  Lab 04/22/18 1739 04/23/18 0243 04/24/18 0259  WBC 12.4* 12.2* 10.0  RBC 6.34* 6.10* 5.93*  HGB 16.6* 15.7* 15.5*  HCT 55.3* 53.2* 52.0*  MCV 87.2 87.2 87.7  MCH 26.2 25.7* 26.1  MCHC 30.0 29.5* 29.8*  RDW 20.2* 19.9* 19.7*  PLT 331 344 318    Cardiac EnzymesNo results for input(s): TROPONINI in the last 168 hours.  Recent Labs  Lab 04/22/18 1757  04/22/18 2114  TROPIPOC 0.05 0.04     BNP Recent Labs  Lab 04/22/18 1739  BNP 501.9*     DDimer  Recent Labs  Lab 04/22/18 1901  DDIMER 0.38     Radiology    No results found.  Cardiac Studies   Echo 04/23/18: Study Conclusions  - Left ventricle: The cavity size was normal. Wall thickness was   increased in a pattern of mild LVH. Systolic function was normal.   The estimated ejection fraction was in the range of 60% to 65%.   Wall motion was normal; there were no regional wall motion   abnormalities. Doppler parameters are consistent with abnormal   left ventricular relaxation (grade 1 diastolic dysfunction). - Ventricular septum: The contour showed diastolic flattening. - Mitral valve: Moderately calcified annulus. Valve area by   continuity equation (using LVOT flow): 2.16 cm^2. - Right ventricle: The cavity size was mildly dilated. Wall   thickness was normal. - Pulmonary arteries: Systolic pressure was moderately increased.   PA peak pressure: 58 mm Hg (S).  [place] 04/25/18:  RA = 13 RV = 75/14 PA = 75/33 (47) PCW = 23 Fick cardiac output/index = 5.8/2.7 Thermo CO/CI = 8.6/4.1 PVR (Fick) = 4.1 WU PVR (Thermo) = 2.7 WU Ao sat = 96% PA sat = 73%, 73% High SVC sat 67%  Assessment: 1. Moderate PAH 2. Mildly elevated PCWP 3. High output by Thermo and normal by Fick with PVR range 2.7-4.1 WU 4. No evidence of intracardiac shunting  Patient Profile     62 y.o. female with moderate to severe pulmonary hypertension, diabetes, morbid obesity, hyperlipidemia, and likely OHS/OSA here with acute on chronic right heart failure and volume overload as well as acute renal insufficiency in the setting of diuresis.  Assessment & Plan    # Moderate to severe pulmonary hypertension: Ms. Pettaway has struggled with volume overload. BNP was elevated to 500 on admission yet she also had AKI in the setting of aggressive diuresis.  I personally reviewed her  echo.  LV  function is normal with mild-moderate LVH.  RV function is normal but RV is dilated.  IVC is dilated consistent with volume overload.  She underwent RHC 12/17 which showed moderately elevated pulmonary pressure thought to be due to WHO Class II and III.  There is a mix of diastolic dysfunction and intrinsic lung disease.  She isn't a candidate for pulmonary vasodilators.  Dr. Haroldine Laws recommended weight loss, supplemental O2 and pulmonary rehab.  She was net -636mL yesterday. Continue torsemide and spironolactone.   She still has LE edema and reports she cannot tolerate compression stockings.  She will need a new outpatient sleep study, though she reports she can't tolerate CPAP.  She could potentially even benefit from overnight supplemental O2.   # Hypokalemia: Continue to supplement.   # Hyperlipidemia: Continue atorvastatin.       For questions or updates, please contact Trainer Please consult [email or web address] for contact info under        Signed, Skeet Latch, MD  04/26/2018, 12:05 PM

## 2018-04-26 NOTE — Progress Notes (Signed)
PHARMACY - PHYSICIAN COMMUNICATION CRITICAL VALUE ALERT - BLOOD CULTURE IDENTIFICATION (BCID)  [name] is an 62 y.o. female who presented to [facility] on 04/22/2018 with a chief complaint of leg and abdomen swelling.   Assessment:   62 yo F presents with bilateral leg and abdominal swelling. PMH includes PAH and CHF. Blood cx performed on admit had so far only grown 1/4 staph species. Now micro called stating there is 1/4 actinomyces neuii. This would be a very rare to be a contaminant and should be considered a pathogen in this case.  Name of physician (or Provider) Contacted: Marlowe Sax  Current antibiotics: None  Changes to prescribed antibiotics recommended:  None at this time Recommend a formal ID consult to consider need to treat   Results for orders placed or performed during the hospital encounter of 04/22/18  Blood Culture ID Panel (Reflexed) (Collected: 04/22/2018  6:48 PM)  Result Value Ref Range   Enterococcus species NOT DETECTED NOT DETECTED   Listeria monocytogenes NOT DETECTED NOT DETECTED   Staphylococcus species DETECTED (A) NOT DETECTED   Staphylococcus aureus (BCID) NOT DETECTED NOT DETECTED   Methicillin resistance NOT DETECTED NOT DETECTED   Streptococcus species NOT DETECTED NOT DETECTED   Streptococcus agalactiae NOT DETECTED NOT DETECTED   Streptococcus pneumoniae NOT DETECTED NOT DETECTED   Streptococcus pyogenes NOT DETECTED NOT DETECTED   Acinetobacter baumannii NOT DETECTED NOT DETECTED   Enterobacteriaceae species NOT DETECTED NOT DETECTED   Enterobacter cloacae complex NOT DETECTED NOT DETECTED   Escherichia coli NOT DETECTED NOT DETECTED   Klebsiella oxytoca NOT DETECTED NOT DETECTED   Klebsiella pneumoniae NOT DETECTED NOT DETECTED   Proteus species NOT DETECTED NOT DETECTED   Serratia marcescens NOT DETECTED NOT DETECTED   Haemophilus influenzae NOT DETECTED NOT DETECTED   Neisseria meningitidis NOT DETECTED NOT DETECTED   Pseudomonas  aeruginosa NOT DETECTED NOT DETECTED   Candida albicans NOT DETECTED NOT DETECTED   Candida glabrata NOT DETECTED NOT DETECTED   Candida krusei NOT DETECTED NOT DETECTED   Candida parapsilosis NOT DETECTED NOT DETECTED   Candida tropicalis NOT DETECTED NOT DETECTED   Sparrow Sanzo J 04/26/2018  12:20 PM

## 2018-04-27 ENCOUNTER — Inpatient Hospital Stay (HOSPITAL_COMMUNITY): Payer: 59

## 2018-04-27 DIAGNOSIS — D72829 Elevated white blood cell count, unspecified: Secondary | ICD-10-CM

## 2018-04-27 DIAGNOSIS — E119 Type 2 diabetes mellitus without complications: Secondary | ICD-10-CM

## 2018-04-27 DIAGNOSIS — R7881 Bacteremia: Secondary | ICD-10-CM

## 2018-04-27 DIAGNOSIS — J449 Chronic obstructive pulmonary disease, unspecified: Secondary | ICD-10-CM

## 2018-04-27 DIAGNOSIS — R7989 Other specified abnormal findings of blood chemistry: Secondary | ICD-10-CM

## 2018-04-27 DIAGNOSIS — R238 Other skin changes: Secondary | ICD-10-CM

## 2018-04-27 DIAGNOSIS — Z881 Allergy status to other antibiotic agents status: Secondary | ICD-10-CM

## 2018-04-27 DIAGNOSIS — Z87891 Personal history of nicotine dependence: Secondary | ICD-10-CM

## 2018-04-27 DIAGNOSIS — Z885 Allergy status to narcotic agent status: Secondary | ICD-10-CM

## 2018-04-27 DIAGNOSIS — Z886 Allergy status to analgesic agent status: Secondary | ICD-10-CM

## 2018-04-27 DIAGNOSIS — K5900 Constipation, unspecified: Secondary | ICD-10-CM

## 2018-04-27 DIAGNOSIS — I509 Heart failure, unspecified: Secondary | ICD-10-CM

## 2018-04-27 DIAGNOSIS — R109 Unspecified abdominal pain: Secondary | ICD-10-CM

## 2018-04-27 DIAGNOSIS — R0902 Hypoxemia: Secondary | ICD-10-CM

## 2018-04-27 LAB — COMPREHENSIVE METABOLIC PANEL
ALK PHOS: 68 U/L (ref 38–126)
ALT: 21 U/L (ref 0–44)
ANION GAP: 13 (ref 5–15)
AST: 25 U/L (ref 15–41)
Albumin: 3.4 g/dL — ABNORMAL LOW (ref 3.5–5.0)
BUN: 24 mg/dL — ABNORMAL HIGH (ref 8–23)
CO2: 37 mmol/L — ABNORMAL HIGH (ref 22–32)
Calcium: 8.8 mg/dL — ABNORMAL LOW (ref 8.9–10.3)
Chloride: 88 mmol/L — ABNORMAL LOW (ref 98–111)
Creatinine, Ser: 0.93 mg/dL (ref 0.44–1.00)
GFR calc Af Amer: >60 mL/min (ref >60)
GFR calc non Af Amer: >60 mL/min (ref >60)
Glucose, Bld: 81 mg/dL (ref 70–99)
Potassium: 3.6 mmol/L (ref 3.5–5.1)
Sodium: 138 mmol/L (ref 135–145)
Total Bilirubin: 1.2 mg/dL (ref 0.3–1.2)
Total Protein: 6.2 g/dL — ABNORMAL LOW (ref 6.5–8.1)

## 2018-04-27 LAB — CBC WITH DIFFERENTIAL/PLATELET
ABS IMMATURE GRANULOCYTES: 0.02 10*3/uL (ref 0.00–0.07)
Basophils Absolute: 0.1 10*3/uL (ref 0.0–0.1)
Basophils Relative: 1 %
Eosinophils Absolute: 0 10*3/uL (ref 0.0–0.5)
Eosinophils Relative: 0 %
HCT: 53 % — ABNORMAL HIGH (ref 36.0–46.0)
Hemoglobin: 15.5 g/dL — ABNORMAL HIGH (ref 12.0–15.0)
Immature Granulocytes: 0 %
Lymphocytes Relative: 31 %
Lymphs Abs: 3.4 10*3/uL (ref 0.7–4.0)
MCH: 26.1 pg (ref 26.0–34.0)
MCHC: 29.2 g/dL — AB (ref 30.0–36.0)
MCV: 89.1 fL (ref 80.0–100.0)
MONO ABS: 1.4 10*3/uL — AB (ref 0.1–1.0)
Monocytes Relative: 12 %
Neutro Abs: 6.1 10*3/uL (ref 1.7–7.7)
Neutrophils Relative %: 56 %
Platelets: 298 10*3/uL (ref 150–400)
RBC: 5.95 MIL/uL — ABNORMAL HIGH (ref 3.87–5.11)
RDW: 19.9 % — ABNORMAL HIGH (ref 11.5–15.5)
WBC: 10.9 10*3/uL — AB (ref 4.0–10.5)
nRBC: 0 % (ref 0.0–0.2)

## 2018-04-27 LAB — CULTURE, BLOOD (ROUTINE X 2): Culture: NO GROWTH

## 2018-04-27 LAB — GLUCOSE, CAPILLARY
Glucose-Capillary: 118 mg/dL — ABNORMAL HIGH (ref 70–99)
Glucose-Capillary: 249 mg/dL — ABNORMAL HIGH (ref 70–99)

## 2018-04-27 LAB — MAGNESIUM: Magnesium: 1.7 mg/dL (ref 1.7–2.4)

## 2018-04-27 MED ORDER — MOMETASONE FURO-FORMOTEROL FUM 200-5 MCG/ACT IN AERO: 1.0000 | INHALATION_SPRAY | Freq: Two times a day (BID) | RESPIRATORY_TRACT | 0 refills | Status: DC

## 2018-04-27 MED ORDER — SPIRONOLACTONE 25 MG PO TABS: 25.0000 mg | ORAL_TABLET | Freq: Every day | ORAL | 0 refills | Status: DC

## 2018-04-27 MED ORDER — ROPINIROLE HCL 0.25 MG PO TABS: 0.2500 mg | ORAL_TABLET | Freq: Every day | ORAL | Status: DC

## 2018-04-27 MED ORDER — TORSEMIDE 20 MG PO TABS: 60.0000 mg | ORAL_TABLET | Freq: Two times a day (BID) | ORAL | 0 refills | Status: DC

## 2018-04-27 MED ORDER — METOLAZONE 2.5 MG PO TABS: 5.0000 mg | ORAL_TABLET | ORAL | 0 refills | Status: DC

## 2018-04-27 MED ORDER — ROPINIROLE HCL 0.25 MG PO TABS: 0.2500 mg | ORAL_TABLET | Freq: Every day | ORAL | 0 refills | Status: DC

## 2018-04-27 MED ORDER — PREDNISONE 10 MG PO TABS: ORAL_TABLET | ORAL | 0 refills | Status: DC

## 2018-04-27 MED ORDER — IOHEXOL 300 MG/ML  SOLN
100.0000 mL | Freq: Once | INTRAMUSCULAR | Status: AC | PRN
  Administered 2018-04-27: 100 mL via INTRAVENOUS

## 2018-04-27 MED ORDER — POLYETHYLENE GLYCOL 3350 17 G PO PACK: 17.0000 g | PACK | Freq: Every day | ORAL | 0 refills | Status: DC | PRN

## 2018-04-27 NOTE — Progress Notes (Signed)
Occupational Therapy Treatment Patient Details Name: [name] MRN: [id number] DOB: 06-24-1955 Today's Date: 04/27/2018    History of present illness 62 y.o. female admitted with acute respiratory failure with hypoxia thought to be due to bronchitis/COPD exacerbation in setting of advanced COPD, pulmonary HTN, and R sided HF.    OT comments  Pt progressing towards acute OT goals. Pt with flat affect and tearful during session. Updated d/c plan to include HHOT recommendation. Pt reports financial barrier to [facility] therapy. Discussed energy conservation strategies and ways to work on increasing activity tolerance during ADLs.    Follow Up Recommendations  Home health OT;Supervision/Assistance - 24 hour    Equipment Recommendations  None recommended by OT    Recommendations for Other Services      Precautions / Restrictions Precautions Precaution Comments: Watch O2 Restrictions Weight Bearing Restrictions: No       Mobility Bed Mobility                  Transfers Overall transfer level: Modified independent Equipment used: None                  Balance Overall balance assessment: Needs assistance   Sitting balance-Leahy Scale: Good       Standing balance-Leahy Scale: Fair Standing balance comment: seeks single extremity external support at times when walking                           ADL either performed or assessed with clinical judgement   ADL Overall ADL's : Needs assistance/impaired     Grooming: Wash/dry hands;Supervision/safety;Standing                   Toilet Transfer: Ambulation;Supervision/safety           Functional mobility during ADLs: Supervision/safety General ADL Comments: Pt at sink washing hands following toileting upon OT arrival. Discussed self-monitoring activity tolerance, energy conservation strategies.     Vision       Perception     Praxis      Cognition Arousal/Alertness:  Awake/alert Behavior During Therapy: Flat affect(tearful) Overall Cognitive Status: Within Functional Limits for tasks assessed                                 General Comments: Pt with flat affect and tearful during session.         Exercises     Shoulder Instructions       General Comments      Pertinent Vitals/ Pain       Pain Assessment: Faces Faces Pain Scale: Hurts little more Pain Location: unspecified; tearful, flat affect Pain Intervention(s): Monitored during session  Home Living                                          Prior Functioning/Environment              Frequency  Min 2X/week        Progress Toward Goals  OT Goals(current goals can now be found in the care plan section)  Progress towards OT goals: Progressing toward goals  Acute Rehab OT Goals Patient Stated Goal: to rest OT Goal Formulation: With patient/family Time For Goal Achievement: 05/07/18 Potential to Achieve Goals: Good ADL Goals Pt Will  Perform Lower Body Dressing: with modified independence;sit to/from stand Pt Will Transfer to Toilet: with modified independence;regular height toilet Pt Will Perform Toileting - Clothing Manipulation and hygiene: with modified independence;sit to/from stand Pt Will Perform Tub/Shower Transfer: with modified independence;ambulating;shower seat Additional ADL Goal #1: Pt will verbalize 3 strategies to conserve energy during daily ADL routine.  Plan Discharge plan needs to be updated    Co-evaluation                 AM-PAC OT "6 Clicks" Daily Activity     Outcome Measure   Help from another person eating meals?: None Help from another person taking care of personal grooming?: None Help from another person toileting, which includes using toliet, bedpan, or urinal?: A Little Help from another person bathing (including washing, rinsing, drying)?: A Lot Help from another person to put on and taking off  regular upper body clothing?: None Help from another person to put on and taking off regular lower body clothing?: A Lot 6 Click Score: 19    End of Session Equipment Utilized During Treatment: Oxygen  OT Visit Diagnosis: Other abnormalities of gait and mobility (R26.89);Pain   Activity Tolerance Patient tolerated treatment well   Patient Left Other (comment)(sitting EOB, talking on the phone)   Nurse Communication          Time: 2595-6387 OT Time Calculation (min): 9 min  Charges: OT General Charges $OT Visit: 1 Visit OT Treatments $Self Care/Home Management : 8-22 mins  Tyrone Schimke, OT Acute Rehabilitation Services Pager: [phone] Office: [phone]    [name] 04/27/2018, 1:38 PM

## 2018-04-27 NOTE — Progress Notes (Signed)
Progress Note  Patient Name: [name] Date of Encounter: 04/27/2018  Primary Cardiologist: No primary care provider on file. [facility]  Subjective   Feeling better.  She had some shortness of breath when blowing her nose.  She has ambulated and felt well.  She thinks her edema continues to improve.  Wants to go home.  Inpatient Medications    Scheduled Meds: . atorvastatin  40 mg Oral Daily  . enoxaparin (LOVENOX) injection  40 mg Subcutaneous Q24H  . escitalopram  20 mg Oral Daily  . insulin aspart  0-15 Units Subcutaneous TID WC  . insulin aspart  0-5 Units Subcutaneous QHS  . ipratropium-albuterol  3 mL Nebulization TID  . mometasone-formoterol  1 puff Inhalation BID  . montelukast  10 mg Oral Daily  . potassium chloride  40 mEq Oral TID  . predniSONE  40 mg Oral Q breakfast  . rOPINIRole  0.25 mg Oral QHS  . spironolactone  25 mg Oral Daily  . torsemide  60 mg Oral BID   Continuous Infusions:  PRN Meds: acetaminophen **OR** acetaminophen, albuterol, HYDROcodone-acetaminophen, ondansetron **OR** ondansetron (ZOFRAN) IV, polyethylene glycol, zolpidem   Vital Signs    Vitals:   04/27/18 0016 04/27/18 0525 04/27/18 0733 04/27/18 0735  BP: 111/63  (!) 146/78   Pulse: 92  92   Resp: 20  19   Temp: 98.1 F (36.7 C)  98.3 F (36.8 C)   TempSrc: Oral  Oral   SpO2: 100%  93% 97%  Weight:  102.8 kg    Height:        Intake/Output Summary (Last 24 hours) at 04/27/2018 1125 Last data filed at 04/27/2018 1000 Gross per 24 hour  Intake 1360 ml  Output 2750 ml  Net -1390 ml   Filed Weights   04/25/18 0500 04/26/18 0629 04/27/18 0525  Weight: 100.1 kg 100.1 kg 102.8 kg    Telemetry    Sinus rhythm, sinus tachycardia.  PVCs- Personally Reviewed  ECG    n/a - Personally Reviewed  Physical Exam   VS:  BP (!) 146/78 (BP Location: Left Arm)   Pulse 92   Temp 98.3 F (36.8 C) (Oral)   Resp 19   Ht 5\' 7"  (1.702 m) Comment: pt stated  Wt 102.8 kg    SpO2 97%   BMI 35.49 kg/m  , BMI Body mass index is 35.49 kg/m. GENERAL: Chronically ill-appearing. Lying flat in bed.  No acute distress. HEENT: Pupils equal round and reactive, fundi not visualized, oral mucosa unremarkable NECK:  jugular venous distention difficult to assess, carotid upstroke brisk and symmetric, no bruits, LUNGS:  Clear to auscultation bilaterally HEART:  RRR.  PMI not displaced or sustained,S1 and S2 within normal limits, no S3, no S4, no clicks, no rubs, no murmurs ABD:  Flat, positive bowel sounds normal in frequency in pitch, no bruits, no rebound, no guarding, no midline pulsatile mass, no hepatomegaly, no splenomegaly EXT:  2 plus pulses throughout, 1+ LE lower edema, no cyanosis no clubbing SKIN:  Bilateral LE erythema NEURO:  Cranial nerves II through XII grossly intact, motor grossly intact throughout [facility]:  Cognitively intact, oriented to person place and time   Labs    Chemistry Recent Labs  Lab 04/22/18 1739  04/25/18 0218 04/26/18 0223 04/27/18 0233  NA 131*   < > 139 138 138  K 3.3*   < > 3.4* 3.4* 3.6  CL 68*   < > 78* 82* 88*  CO2 41*   < >  47* 42* 37*  GLUCOSE 136*   < > 160* 170* 81  BUN 45*   < > 31* 30* 24*  CREATININE 1.27*   < > 1.08* 1.15* 0.93  CALCIUM 9.7   < > 9.0 9.7 8.8*  PROT 7.1  --   --   --  6.2*  ALBUMIN 3.7  --   --   --  3.4*  AST 43*  --   --   --  25  ALT 17  --   --   --  21  ALKPHOS 88  --   --   --  68  BILITOT 3.2*  --   --   --  1.2  GFRNONAA 46*   < > 55* 51* >60  GFRAA 53*   < > >60 59* >60  ANIONGAP 22*   < > 14 14 13    < > = values in this interval not displayed.     Hematology Recent Labs  Lab 04/23/18 0243 04/24/18 0259 04/27/18 0233  WBC 12.2* 10.0 10.9*  RBC 6.10* 5.93* 5.95*  HGB 15.7* 15.5* 15.5*  HCT 53.2* 52.0* 53.0*  MCV 87.2 87.7 89.1  MCH 25.7* 26.1 26.1  MCHC 29.5* 29.8* 29.2*  RDW 19.9* 19.7* 19.9*  PLT 344 318 298    Cardiac EnzymesNo results for input(s): TROPONINI in the  last 168 hours.  Recent Labs  Lab 04/22/18 1757 04/22/18 2114  TROPIPOC 0.05 0.04     BNP Recent Labs  Lab 04/22/18 1739  BNP 501.9*     DDimer  Recent Labs  Lab 04/22/18 1901  DDIMER 0.38     Radiology    Ct Chest W Contrast  Result Date: 04/27/2018 CLINICAL DATA:  Acute respiratory failure and hypoxia. EXAM: CT CHEST WITH CONTRAST TECHNIQUE: Multidetector CT imaging of the chest was performed during intravenous contrast administration. CONTRAST:  129mL OMNIPAQUE IOHEXOL 300 MG/ML  SOLN COMPARISON:  Chest x-ray dated April 22, 2018. FINDINGS: Cardiovascular: Mild right heart enlargement. No pericardial effusion. No aortic aneurysm or dissection. Coronary, aortic arch, and branch vessel atherosclerotic vascular disease. No central pulmonary embolism. Dilated main pulmonary artery measuring up to 3.7 cm. Mediastinum/Nodes: No enlarged mediastinal, hilar, or axillary lymph nodes. Thyroid gland, trachea, and esophagus demonstrate no significant findings. Lungs/Pleura: Moderate centrilobular emphysema. No focal consolidation, pleural effusion, or pneumothorax. 4 mm right upper lobe pulmonary nodule (series 6, image 25). Upper Abdomen: No acute abnormality. 2.0 cm left adrenal nodule is incompletely characterized on this single phase study, however, this was present on prior CT from May 2004 based on the available CT report, although the images are not available for review. This lesion is therefore most consistent with a benign adrenal adenoma. Musculoskeletal: No chest wall abnormality. No acute or significant osseous findings. Probable small bone island in the C7 vertebral body. IMPRESSION: 1.  No acute intrathoracic process. 2. Pulmonary arterial hypertension. 3. 4 mm right upper lobe pulmonary nodule. No follow-up needed if patient is low-risk. Non-contrast chest CT can be considered in 12 months if patient is high-risk. This recommendation follows the consensus statement: Guidelines for  Management of Incidental Pulmonary Nodules Detected on CT Images: From the Fleischner Society 2017; Radiology 2017; 284:228-243. 4.  Emphysema (ICD10-J43.9). 5.  Aortic atherosclerosis (ICD10-I70.0). Electronically Signed   By: Titus Dubin M.D.   On: 04/27/2018 03:10   Ct Maxillofacial W Contrast  Result Date: 04/27/2018 CLINICAL DATA:  Fever of unknown origin EXAM: CT MAXILLOFACIAL WITH CONTRAST TECHNIQUE: Multidetector CT  imaging of the maxillofacial structures was performed with intravenous contrast. Multiplanar CT image reconstructions were also generated. CONTRAST:  147mL OMNIPAQUE IOHEXOL 300 MG/ML  SOLN COMPARISON:  None. FINDINGS: Osseous: No facial fracture. Orbits: Normal. Sinuses --Frontal: No fluid levels or mucosal thickening. Frontal recesses are patent. --Ethmoid: No fluid levels or mucosal thickening. --Sphenoid: No fluid levels or mucosal thickening. Sphenoethmoidal recesses and sphenoid ostia are patent. --Maxillary: No fluid levels or mucosal thickening. Ostiomeatal complexes are patent. --Nasal cavity: No significant septal deviation or abnormal mucosal thickening. --Nasopharynx: Clear. --Mastoid air cells: No effusion. Soft tissues: Normal. Limited intracranial: Normal. IMPRESSION: No evidence of sinusitis. Electronically Signed   By: Ulyses Jarred M.D.   On: 04/27/2018 01:49    Cardiac Studies   Echo 04/23/18: Study Conclusions  - Left ventricle: The cavity size was normal. Wall thickness was   increased in a pattern of mild LVH. Systolic function was normal.   The estimated ejection fraction was in the range of 60% to 65%.   Wall motion was normal; there were no regional wall motion   abnormalities. Doppler parameters are consistent with abnormal   left ventricular relaxation (grade 1 diastolic dysfunction). - Ventricular septum: The contour showed diastolic flattening. - Mitral valve: Moderately calcified annulus. Valve area by   continuity equation (using LVOT  flow): 2.16 cm^2. - Right ventricle: The cavity size was mildly dilated. Wall   thickness was normal. - Pulmonary arteries: Systolic pressure was moderately increased.   PA peak pressure: 58 mm Hg (S).  [place] 04/25/18:  RA = 13 RV = 75/14 PA = 75/33 (47) PCW = 23 Fick cardiac output/index = 5.8/2.7 Thermo CO/CI = 8.6/4.1 PVR (Fick) = 4.1 WU PVR (Thermo) = 2.7 WU Ao sat = 96% PA sat = 73%, 73% High SVC sat 67%  Assessment: 1. Moderate PAH 2. Mildly elevated PCWP 3. High output by Thermo and normal by Fick with PVR range 2.7-4.1 WU 4. No evidence of intracardiac shunting  Patient Profile     62 y.o. female with moderate to severe pulmonary hypertension, diabetes, morbid obesity, hyperlipidemia, and likely OHS/OSA here with acute on chronic right heart failure and volume overload as well as acute renal insufficiency in the setting of diuresis.  Assessment & Plan    # Moderate to severe pulmonary hypertension: Ms. Pardo has struggled with volume overload. BNP was elevated to 500 on admission yet she also had AKI in the setting of aggressive diuresis.  Creatinine now down to 0.93.   I personally reviewed her echo.  LV function is normal with mild-moderate LVH.  RV function is normal but RV is dilated.  IVC was dilated consistent with volume overload and elevated R heart pressure.  She underwent RHC 12/17 which showed moderately elevated pulmonary pressure thought to be due to WHO Class II and III.  There is a mix of diastolic dysfunction and intrinsic lung disease.  She isn't a candidate for pulmonary vasodilators.  Dr. Haroldine Laws recommended weight loss, supplemental O2 and pulmonary rehab.  She was net -767mL yesterday, yet her weight is reportedly up 2.7kg.  I suspect this may be due to the difference in scales.  Overall she is doing better.  She continues to require 2 L of supplemental oxygen.  She is unable to wear compression stockings.  She has remained stable on oral diuretics.   From a cardiology perspective she is stable for discharge.  Would recommend that she follow-up with her outpatient cardiologist within 1 to 2 weeks.  I would not order standing metolazone, but she can take 5 mg as needed for weight gain of 3 pounds in 1 day or 5 pounds in 1 week.  She needs an outpatient sleep study.  # Hypokalemia: Continue to supplement.   # Hyperlipidemia: Continue atorvastatin.       For questions or updates, please contact [place] Please consult [email or web address] for contact info under        Signed, Skeet Latch, MD  04/27/2018, 11:25 AM

## 2018-04-27 NOTE — Plan of Care (Signed)
  Problem: Spiritual Needs Goal: Ability to function at adequate level Outcome: Progressing   Problem: Education: Goal: Knowledge of General Education information will improve Description Including pain rating scale, medication(s)/side effects and non-pharmacologic comfort measures Outcome: Progressing   Problem: Health Behavior/Discharge Planning: Goal: Ability to manage health-related needs will improve Outcome: Progressing   Problem: Clinical Measurements: Goal: Ability to maintain clinical measurements within normal limits will improve Outcome: Progressing Goal: Will remain free from infection Outcome: Progressing Goal: Diagnostic test results will improve Outcome: Progressing Goal: Respiratory complications will improve Outcome: Progressing Goal: Cardiovascular complication will be avoided Outcome: Progressing   Problem: Activity: Goal: Risk for activity intolerance will decrease Outcome: Progressing   Problem: Nutrition: Goal: Adequate nutrition will be maintained Outcome: Progressing   Problem: Coping: Goal: Level of anxiety will decrease Outcome: Progressing   Problem: Elimination: Goal: Will not experience complications related to bowel motility Outcome: Progressing Goal: Will not experience complications related to urinary retention Outcome: Progressing   Problem: Pain Managment: Goal: General experience of comfort will improve Outcome: Progressing   Problem: Safety: Goal: Ability to remain free from injury will improve Outcome: Progressing   Problem: Skin Integrity: Goal: Risk for impaired skin integrity will decrease Outcome: Progressing   Problem: Spiritual Needs Goal: Ability to function at adequate level Outcome: Progressing   Problem: Education: Goal: Knowledge of General Education information will improve Description Including pain rating scale, medication(s)/side effects and non-pharmacologic comfort measures Outcome: Progressing    Problem: Health Behavior/Discharge Planning: Goal: Ability to manage health-related needs will improve Outcome: Progressing   Problem: Clinical Measurements: Goal: Ability to maintain clinical measurements within normal limits will improve Outcome: Progressing Goal: Will remain free from infection Outcome: Progressing Goal: Diagnostic test results will improve Outcome: Progressing Goal: Respiratory complications will improve Outcome: Progressing Goal: Cardiovascular complication will be avoided Outcome: Progressing   Problem: Activity: Goal: Risk for activity intolerance will decrease Outcome: Progressing   Problem: Nutrition: Goal: Adequate nutrition will be maintained Outcome: Progressing   Problem: Coping: Goal: Level of anxiety will decrease Outcome: Progressing   Problem: Elimination: Goal: Will not experience complications related to bowel motility Outcome: Progressing Goal: Will not experience complications related to urinary retention Outcome: Progressing   Problem: Pain Managment: Goal: General experience of comfort will improve Outcome: Progressing   Problem: Safety: Goal: Ability to remain free from injury will improve Outcome: Progressing   Problem: Skin Integrity: Goal: Risk for impaired skin integrity will decrease Outcome: Progressing

## 2018-04-27 NOTE — Consult Note (Signed)
[facility]    Date of Admission:  04/22/2018   Total days of antibiotics: 1        Penicillin 12/18-     Reason for Consult: Blood culture positive for Actinomyces     Referring Provider: Dr. Posey Pronto Primary Care Provider: No PCP listed  Assessment: Blood culture positive for actinomyces - Patient has been afebrile - Her symptoms of cough and dyspnea have been improving during her hospitalization prior to the initiation of antibiotics - Mild leukocytosis of 12.2 on admission when blood cultures were drawn. Still has mild leukocytosis, but she has also been treated with steroids during this admission.   - Maxillofacial CT without signs of sinusitis - CT chest without sign of infection - No signs of dental or maxillary infections on exam.  - Her abdominal pain is likely secondary to subcutaneous anticoagulation administration based on its focal nature. Constipation may also be contributing to abdominal discomfort. - Overall, because the patient has no clinical signs or symptoms of infection, the Actinomyces is most likely a contaminant, as is the Coag negative staph. Can discontinue antibiotics. If she worsens after the penicillin is discontinued and begins to fever, can reassess validity of bacteremia and need for antibiotics.  Plan: 1. Discontinue antibiotics for Actinomyces blood culture, which is likely a contaminant.  Principal Problem:   Acute respiratory failure with hypoxia (HCC) Active Problems:   Chronic right-sided CHF (congestive heart failure) (HCC)   PAH (pulmonary artery hypertension) (HCC)   Hypochloremic alkalosis   Prerenal azotemia   Pulmonary hypertension (HCC)   Morbid obesity (HCC)   Scheduled Meds: . atorvastatin  40 mg Oral Daily  . enoxaparin (LOVENOX) injection  40 mg Subcutaneous Q24H  . escitalopram  20 mg Oral Daily  . insulin aspart  0-15 Units Subcutaneous TID WC  . insulin aspart  0-5 Units Subcutaneous QHS    . ipratropium-albuterol  3 mL Nebulization TID  . mometasone-formoterol  1 puff Inhalation BID  . montelukast  10 mg Oral Daily  . potassium chloride  40 mEq Oral TID  . predniSONE  40 mg Oral Q breakfast  . rOPINIRole  0.25 mg Oral QHS  . spironolactone  25 mg Oral Daily  . torsemide  60 mg Oral BID   Continuous Infusions: . pencillin G potassium IV 5 Million Units (04/27/18 0756)   PRN Meds:.acetaminophen **OR** acetaminophen, albuterol, HYDROcodone-acetaminophen, ondansetron **OR** ondansetron (ZOFRAN) IV, polyethylene glycol, zolpidem  HPI: [name] is a 63 y.o. female with a history of severe pulmonary HTN, COPD, DMII, and tobacco use who presented with dyspnea, cough, and leg swelling and was admitted to the hospital on 12/14 for acute respiratory failure with hypoxia thought to be due to COPD exacerbation and progression of her right-sided heart failure. She has been treated with supplemental oxygen, steroids, and diuresis with improvement in her symptoms. She also underwent right heart catheterization on 12/17, which was consistent with pulmonary hypertension.   Infectious disease was consulted because 1/2 blood cultures drawn on 12/14 was positive for both Actinomyces and coag negative staph after four days. These blood cultures appear to have been drawn at the time of presentation to the hospital when there was concern for a possible sepsis with tachycardia and WBC of 12.2. She was started on high-dose ampicillin for empiric actinomyces coverage on 12/18. Today the patient reports that her dyspnea and cough have been improving since her admission and that she  overall feels much better. She denies any history of mouth or jaw infections. She had her teeth cleaned 3-4 weeks ago. Her only concern today is abdominal discomfort around the site of her Lovenox injections and constipation without a bowel movement since admission.  Review of Systems: Review of Systems  Constitutional:  Negative for chills and fever.  HENT: Negative for congestion and sinus pain.   Respiratory: Positive for cough and shortness of breath.   Cardiovascular: Positive for leg swelling.  Gastrointestinal: Positive for abdominal pain and constipation.  Genitourinary: Negative for dysuria.  Skin: Negative for rash.    Past Medical History:  Diagnosis Date  . ADHD   . Chronic right-sided CHF (congestive heart failure) ([place])   . Depression   . GERD (gastroesophageal reflux disease)   . Migraine   . PAH (pulmonary artery hypertension) ([place])   . Restless leg syndrome     Social History   Tobacco Use  . Smoking status: Former Smoker    Packs/day: 0.50    Types: Cigarettes  . Smokeless tobacco: Never Used  Substance Use Topics  . Alcohol use: No  . Drug use: No    Family History  Problem Relation Age of Onset  . Alcohol abuse Sister   . Drug abuse Sister   . Mental illness Sister   . Early death Sister   . Pancreatic cancer Mother   . Emphysema Father    Allergies  Allergen Reactions  . Asa [Aspirin] Nausea Only    abd pain  . Codeine Nausea Only  . Sulfa Antibiotics Hives    OBJECTIVE: Blood pressure (!) 146/78, pulse 92, temperature 98.3 F (36.8 C), temperature source Oral, resp. rate 19, height 5\' 7"  (1.702 m), weight 102.8 kg, SpO2 97 %.  Physical Exam Gen: Patient is lying comfortably in bed. No distress. Alert and oriented. HENT: No sinus tenderness. No jaw deformities or abscesses. No jaw tenderness to palpation. Normal dentition without signs of infection.  CV: RRR, no murmurs, rubs, or gallops Pulm: CTAB, no wheezes, rales, or rhonchi Abd: Soft, non-distended. Focal tenderness to the LLQ where the patient has been receiving her Lovenox shots. Extremities: Trace edema in the bilateral lower extremities with chronic stasis skin changes. Skin: No rashes or wounds.  Lab Results Lab Results  Component Value Date   WBC 10.9 (H) 04/27/2018   HGB 15.5 (H)  04/27/2018   HCT 53.0 (H) 04/27/2018   MCV 89.1 04/27/2018   PLT 298 04/27/2018    Lab Results  Component Value Date   CREATININE 0.93 04/27/2018   BUN 24 (H) 04/27/2018   NA 138 04/27/2018   K 3.6 04/27/2018   CL 88 (L) 04/27/2018   CO2 37 (H) 04/27/2018    Lab Results  Component Value Date   ALT 21 04/27/2018   AST 25 04/27/2018   ALKPHOS 68 04/27/2018   BILITOT 1.2 04/27/2018     Microbiology: Recent Results (from the past 240 hour(s))  Blood culture (routine x 2)     Status: None   Collection Time: 04/22/18  6:47 PM  Result Value Ref Range Status   Specimen Description BLOOD RIGHT HAND  Final   Special Requests   Final    BOTTLES DRAWN AEROBIC AND ANAEROBIC Blood Culture results may not be optimal due to an inadequate volume of blood received in culture bottles   Culture   Final    NO GROWTH 5 DAYS Performed at [facility], [place] [address],  [place], [place] [address]    Report Status 04/27/2018 FINAL  Final  Blood culture (routine x 2)     Status: Abnormal   Collection Time: 04/22/18  6:48 PM  Result Value Ref Range Status   Specimen Description BLOOD LEFT WRIST  Final   Special Requests   Final    BOTTLES DRAWN AEROBIC AND ANAEROBIC Blood Culture adequate volume   Culture  Setup Time   Final    GRAM POSITIVE COCCI IN CLUSTERS AEROBIC BOTTLE ONLY CRITICAL RESULT CALLED TO, READ BACK BY AND VERIFIED WITH: [place], [id number] FCP GRAM POSITIVE RODS ANAEROBIC BOTTLE ONLY CRITICAL RESULT CALLED TO, READ BACK BY AND VERIFIED WITH: PHARMD N BATCHELDER 04/26/18 AT 1205 BY CM    Culture (A)  Final    STAPHYLOCOCCUS SPECIES (COAGULASE NEGATIVE) THE SIGNIFICANCE OF ISOLATING THIS ORGANISM FROM A SINGLE SET OF BLOOD CULTURES WHEN MULTIPLE SETS ARE DRAWN IS UNCERTAIN. PLEASE NOTIFY THE MICROBIOLOGY DEPARTMENT WITHIN ONE WEEK IF SPECIATION AND SENSITIVITIES ARE REQUIRED. ACTINOMYCES NEUII Standardized susceptibility testing for this organism is not  available. Performed at [facility], [place] [address] [place], [place] [address]    Report Status 04/24/2018 FINAL  Final  Blood Culture ID Panel (Reflexed)     Status: Abnormal   Collection Time: 04/22/18  6:48 PM  Result Value Ref Range Status   Enterococcus species NOT DETECTED NOT DETECTED Final   Listeria monocytogenes NOT DETECTED NOT DETECTED Final   Staphylococcus species DETECTED (A) NOT DETECTED Final    Comment: Methicillin (oxacillin) susceptible coagulase negative staphylococcus. Possible blood culture contaminant (unless isolated from more than one blood culture draw or clinical case suggests pathogenicity). No antibiotic treatment is indicated for blood  culture contaminants. CRITICAL RESULT CALLED TO, READ BACK BY AND VERIFIED WITH: PHARMD DEJA, [id number] FCP    Staphylococcus aureus (BCID) NOT DETECTED NOT DETECTED Final   Methicillin resistance NOT DETECTED NOT DETECTED Final   Streptococcus species NOT DETECTED NOT DETECTED Final   Streptococcus agalactiae NOT DETECTED NOT DETECTED Final   Streptococcus pneumoniae NOT DETECTED NOT DETECTED Final   Streptococcus pyogenes NOT DETECTED NOT DETECTED Final   Acinetobacter baumannii NOT DETECTED NOT DETECTED Final   Enterobacteriaceae species NOT DETECTED NOT DETECTED Final   Enterobacter cloacae complex NOT DETECTED NOT DETECTED Final   Escherichia coli NOT DETECTED NOT DETECTED Final   Klebsiella oxytoca NOT DETECTED NOT DETECTED Final   Klebsiella pneumoniae NOT DETECTED NOT DETECTED Final   Proteus species NOT DETECTED NOT DETECTED Final   Serratia marcescens NOT DETECTED NOT DETECTED Final   Haemophilus influenzae NOT DETECTED NOT DETECTED Final   Neisseria meningitidis NOT DETECTED NOT DETECTED Final   Pseudomonas aeruginosa NOT DETECTED NOT DETECTED Final   Candida albicans NOT DETECTED NOT DETECTED Final   Candida glabrata NOT DETECTED NOT DETECTED Final   Candida krusei NOT DETECTED NOT DETECTED Final    Candida parapsilosis NOT DETECTED NOT DETECTED Final   Candida tropicalis NOT DETECTED NOT DETECTED Final    Comment: Performed at [facility], [address] [place], [place] [address]    Deborah N Dorrell, [place] [facility] [phone] pager   [phone] cell 04/27/2018, 9:20 AM

## 2018-04-27 NOTE — Research (Signed)
[place] Informed Consent   Subject Name: [name]  Subject met inclusion and exclusion criteria.  The informed consent form, study requirements and expectations were reviewed with the subject and questions and concerns were addressed prior to the signing of the consent form.  The subject verbalized understanding of the trail requirements.  The subject agreed to participate in the [facility] trial and signed the informed consent.  The informed consent was obtained prior to performance of any protocol-specific procedures for the subject.  A copy of the signed informed consent was given to the subject and a copy was placed in the subject's medical record.  Neva Seat 04/27/2018, 12:16 PM

## 2018-04-27 NOTE — Care Management Note (Addendum)
Case Management Note  Patient Details  Name: [name] MRN: [id number] Date of Birth: [birth year]  Subjective/Objective:   From home with spouse, she will need [facility] and home oxygen, NCM offered choice, she chose [facility], referral made to Butch Penny for [facility], and [place] for home oxygen.  Butch Penny will let patient know if she has a copay for [facility] and how much it is.  Patient will then determine if she still wants HHRN.   Butch Penny informed NCM that patient states she does not want HHRN , the copay is 24.00 per visit.  She is refusing HHRN.                               Action/Plan: DC home when ready.   Expected Discharge Date:  04/27/18               Expected Discharge Plan:  [place]  In-House Referral:     Discharge planning Services  CM Consult  Post Acute Care Choice:  Home Health, Durable Medical Equipment Choice offered to:  Patient  DME Arranged:  Oxygen DME Agency:  [place]:  RN [facility] Agency:  [place]  Status of Service:  Completed, signed off  If discussed at [place] of Stay Meetings, dates discussed:    Additional Comments:  Zenon Mayo, RN 04/27/2018, 1:46 PM

## 2018-04-27 NOTE — Progress Notes (Signed)
SATURATION QUALIFICATIONS: (This note is used to comply with regulatory documentation for home oxygen)  Patient Saturations on Room Air at Rest = 85%  Patient Saturations on 2L at Rest = 93%  Patient Saturations on 2L while Ambulating =86%  Patient Saturations on 4L Liters of oxygen while Ambulating = 88%  Patient Saturations on 5L Liters of oxygen while Ambulating = 90%

## 2018-04-27 NOTE — Progress Notes (Signed)
Patient transported back to room frm CT, no distress reported, on [facility] at this time, no complaints of pain noted

## 2018-04-27 NOTE — Progress Notes (Signed)
Patient transported to CT by wheelchair, attached to [facility], no distress noted

## 2018-04-28 NOTE — Research (Addendum)
Pt vital signs pre-procedure: Pulse 109 BP 140/85 RR 20 SPO2 93%    Yes No Inclusion   [x]  []  Patient is ?62 years old.  [x]  []  Patient is undergoing in-patient or out-patient elective right heart catheterization.  [x]  []  Patient or legally-authorized representative has signed a written informed consent form (ICF) per 21 CFR Part 50.55(e).    Exclusion   []  [x]  Known restrictive cardiomyopathy (e.g. cardiac amyloidosis) or constrictive cardiac disease (e.g. constrictive pericarditis, cardiac tamponade).  []  [x]  Congenital heart disease other than patent foramen ovale, repaired atrial or ventricular septal defect.  []  [x]  The following valvular diseases: a. Aortic valvular disease greater than mild in severity, b. Mitral valvular disease greater than mild in severity c. Aortic or mitral valve replacement  []  [x]  Active irregular heart rhythms at the time of the study as determined by review of an electrocardiogram completed within the last 30 days (Occasional ectopic beats need not be excluded)  []  [x]  Active mechanical circulation (intra-aortic balloon pump, ventricular assist devices, extracorporeal membrane oxygenation, hemodialysis) running while the study is being performed.  []  [x]  Female patients who are pregnant  []  [x]  Patients who are currently participating in, or have within the past 30 days participated in, another interventional clinical study or have used another investigational device or drug within the past 30 days.  []  [x]   Patients with BMI >40 kg/m  []  [x]   Cardiac transplant within the last 3 months.    Pt states she is post menopausal.

## 2018-04-28 NOTE — Discharge Summary (Signed)
[facility] Discharge Summary   Patient: [name] JKK:[id number]   PCP: Patient, No Pcp Per DOB: 1955-12-11   Date of admission: 04/22/2018   Date of discharge: 04/27/2018   Discharge Diagnoses:   Principal Problem:   Acute respiratory failure with hypoxia ([place]) Active Problems:   Chronic right-sided CHF (congestive heart failure) ([place])   PAH (pulmonary artery hypertension) ([place])   Hypochloremic alkalosis   Prerenal azotemia   Pulmonary hypertension ([employer])   Morbid obesity ([place])   Congestive heart failure (HCC)   Hypoxia   Positive blood culture   Admitted From: Home Disposition: Home with home health, referral to pulmonary rehab  Recommendations for Outpatient Follow-up:  1. Please follow-up with PCP in 1 week. 2. Please follow-up with cardiology in 1 week as well. 3. Patient will need to establish care with pulmonary for PFT as well as sleep study  Follow-up Information    PCP. Schedule an appointment as soon as possible for a visit in 1 week(s).        Hulen, Barb Merino, NP. Schedule an appointment as soon as possible for a visit in 1 week(s).   Specialty:  Cardiology Contact information: [place] [address] [place]. Follow up on 05/01/2018.   Why:  at 9:45 you will be seeing Cephus Shelling information: [place]  [id number] [place] Follow up.   Why:  oxygen Contact information: [address] [place]  [address] [phone]          Diet recommendation: Cardiac diet  Activity: The patient is advised to gradually reintroduce usual activities.  Discharge Condition: good  Code Status: Full code  History of present illness: As per the H and P dictated on admission, " [name] is a 62 y.o. female with medical history significant of PAH, "severe" by echo 05/23/17 at [facility] (cant see actual  echo read directly), preserved LV function, DM2.  Patient presents to the ED with SOB.  Symptoms onset just after thanksgiving, have been worsening.  On 12/9 saw her cardiologist who thought she might be fluid overloaded so they increased torsemide and added zaroxolyn to regimen.  Now she is still short of breath and feels severely dehydrated so much so that she didn't even take her diuretics today.  SOB worse with even minimal exertion.  She has cough, sputum production, and reports leg swelling."  Hospital Course:  Summary of her active problems in the hospital is as following.   Acute respiratory failure with hypoxia (HCC) -Suspect bronchitis/COPD exacerbation in the background of advanced COPD, severe pulmonary hypertension and right-sided heart failure -Last echocardiogram was on 05/2017 outside hospital, unable to review this -Echo with PA pressures of 58, appreciate cardiology input, CHF team to see and determine need for right heart catheterization -Improving, was on IV steroids, transition to prednisone taper -Wean O2, restarted torsemide at 60 mg twice daily -Will need outpatient PFT as well as sleep apnea study as well as referral to pulmonary.  Severe PAH with right-sided heart failure -I suspect this is secondary to COPD, OSA OHS -She was unable to tolerate a sleep study in the past recommended sleep study as outpatient -Clinically close to euvolemic but volume status difficult to assess, restarted torsemide 60 mg daily  -2D echocardiogram with EF of 24%, grade 1 diastolic dysfunction and PA  systolic pressure of 58  -Appreciate heart failure team input,  -Underwent right heart cath which did shows moderate pulmonary hypertension WHO class II or III  Actinomyces and blood culture. 1 out of 2 blood culture is positive for actinomyces as well as coagulase-negative staph. Discussed with infectious disease, recommend to use high-dose penicillin empirically initially. CT  maxillofacial as well as CT chest with contrast were negative for any active infection. Infectious disease after evaluating the patient felt that this could be just contamination and recommended no further work-up.  Contraction alkalosis -Has chronically elevated CO2 at baseline due to compensation for chronic respiratory acidosis -Does not appear dry to me at this time -CO2 has gone up but BUN and creatinine are improving, torsemide restarted  Type 2 diabetes mellitus, uncontrolled with hyperglycemia. No complication. Blood sugar remains well controlled. We will continue home regimen.  Tobacco abuse/suspected COPD -Needs pulmonary follow-up  Suspected sleep apnea -Recommended reevaluation for sleep study  All other chronic medical condition were stable during the hospitalization.  Patient was seen by physical therapy, who recommended home health which was arranged by Education officer, museum and case Freight forwarder. On the day of the discharge the patient's vitals were stable , and no other acute medical condition were reported by patient. the patient was felt safe to be discharge at home with home health.  Consultants: Cardiology, infectious disease Procedures: Right heart cath  DISCHARGE MEDICATION: Allergies as of 04/27/2018      Reactions   Asa [aspirin] Nausea Only   abd pain   Codeine Nausea Only   Sulfa Antibiotics Hives      Medication List    TAKE these medications   ADDERALL PO Take 10 mg by mouth 3 (three) times daily.   atorvastatin 40 MG tablet Commonly known as:  LIPITOR Take 40 mg by mouth daily.   escitalopram 20 MG tablet Commonly known as:  LEXAPRO Take 20 mg by mouth daily.   HYDROcodone-acetaminophen 5-325 MG tablet Commonly known as:  NORCO/VICODIN Take 1 tablet by mouth at bedtime as needed (restless legs).   metFORMIN 500 MG tablet Commonly known as:  GLUCOPHAGE Take 500 mg by mouth 2 (two) times daily with a meal.   metolazone 2.5 MG tablet Commonly  known as:  ZAROXOLYN Take 2 tablets (5 mg total) by mouth as directed. Take 5 mg on Monday and Thursday AS NEEDED ONLY for weight gain of 3 lbs or edema. What changed:    when to take this  additional instructions   mometasone-formoterol 200-5 MCG/ACT Aero Commonly known as:  DULERA Inhale 1 puff into the lungs 2 (two) times daily.   montelukast 10 MG tablet Commonly known as:  SINGULAIR Take 10 mg by mouth daily.   polyethylene glycol packet Commonly known as:  MIRALAX / GLYCOLAX Take 17 g by mouth daily as needed for mild constipation.   potassium chloride 10 MEQ tablet Commonly known as:  K-DUR,KLOR-CON Take 20 mEq by mouth daily.   predniSONE 10 MG tablet Commonly known as:  DELTASONE Take 30mg  daily for 3days,Take 20mg  daily for 3days,Take 10mg  daily for 3days, then stop   rOPINIRole 0.25 MG tablet Commonly known as:  REQUIP Take 1 tablet (0.25 mg total) by mouth at bedtime.   spironolactone 25 MG tablet Commonly known as:  ALDACTONE Take 1 tablet (25 mg total) by mouth daily.   torsemide 20 MG tablet Commonly known as:  DEMADEX Take 3 tablets (60 mg total) by mouth 2 (two) times daily. What changed:  medication strength  how much to take  when to take this      Allergies  Allergen Reactions  . Asa [Aspirin] Nausea Only    abd pain  . Codeine Nausea Only  . Sulfa Antibiotics Hives   Discharge Instructions    AMB referral to Pulmonary Rehabilitation Maintenance Program ([facility] Only)   Complete by:  As directed    Is patient currently participating in the Pulmonary Rehab Undergraduate Program?:  No   Diagnosis:  Pulmonary Hypertension   Oxygen:  Oxygen administration/titration by EP, RT, or RN for SpO2 <88%   Exercise Target Heart Rate:  Per Protocol   Blood Glucose Checks:  As Necessary   Ambulatory referral to Pulmonology   Complete by:  As directed    Sleep apnea and emphysema with pulmonary hypertension.   Ambulatory referral to Sleep Studies    Complete by:  As directed    Diet - low sodium heart healthy   Complete by:  As directed    Discharge instructions   Complete by:  As directed    It is important that you read following instructions as well as go over your medication list with RN to help you understand your care after this hospitalization.  Discharge Instructions: Please follow-up with PCP in one week  Please request your primary care physician to go over all Hospital Tests and Procedure/Radiological results at the follow up,  Please get all Hospital records sent to your PCP by signing hospital release before you go home.   do not take more than prescribed Pain, Sleep and Anxiety Medications. You were cared for by a hospitalist during your hospital stay. If you have any questions about your discharge medications or the care you received while you were in the hospital after you are discharged, you can call the unit you were admitted to and ask to speak with the hospitalist on call if the hospitalist that took care of you is not available.  Once you are discharged, your primary care physician will handle any further medical issues. Please note that NO REFILLS for any discharge medications will be authorized once you are discharged, as it is imperative that you return to your primary care physician (or establish a relationship with a primary care physician if you do not have one) for your aftercare needs so that they can reassess your need for medications and monitor your lab values. You Must read complete instructions/literature along with all the possible adverse reactions/side effects for all the Medicines you take and that have been prescribed to you. Take any new Medicines after you have completely understood and accept all the possible adverse reactions/side effects. Wear Seat belts while driving. If you have smoked or chewed Tobacco in the last 2 yrs please stop smoking and/or stop any Recreational drug use.   Increase  activity slowly   Complete by:  As directed      Discharge Exam: Filed Weights   04/25/18 0500 04/26/18 0629 04/27/18 0525  Weight: 100.1 kg 100.1 kg 102.8 kg   Vitals:   04/27/18 1206 04/27/18 1401  BP:    Pulse:    Resp:    Temp:    SpO2: 93% 96%   General: Appear in no distress, no Rash; Oral Mucosa moist. Cardiovascular: S1 and S2 Present, no Murmur, no JVD Respiratory: Bilateral Air entry present and Clear to Auscultation, no Crackles, no wheezes Abdomen: Bowel Sound present, Soft and no tenderness Extremities: no Pedal edema, no calf tenderness Neurology:  Grossly no focal neuro deficit.  The results of significant diagnostics from this hospitalization (including imaging, microbiology, ancillary and laboratory) are listed below for reference.    Significant Diagnostic Studies: Ct Chest W Contrast  Result Date: 04/27/2018 CLINICAL DATA:  Acute respiratory failure and hypoxia. EXAM: CT CHEST WITH CONTRAST TECHNIQUE: Multidetector CT imaging of the chest was performed during intravenous contrast administration. CONTRAST:  147mL OMNIPAQUE IOHEXOL 300 MG/ML  SOLN COMPARISON:  Chest x-ray dated April 22, 2018. FINDINGS: Cardiovascular: Mild right heart enlargement. No pericardial effusion. No aortic aneurysm or dissection. Coronary, aortic arch, and branch vessel atherosclerotic vascular disease. No central pulmonary embolism. Dilated main pulmonary artery measuring up to 3.7 cm. Mediastinum/Nodes: No enlarged mediastinal, hilar, or axillary lymph nodes. Thyroid gland, trachea, and esophagus demonstrate no significant findings. Lungs/Pleura: Moderate centrilobular emphysema. No focal consolidation, pleural effusion, or pneumothorax. 4 mm right upper lobe pulmonary nodule (series 6, image 25). Upper Abdomen: No acute abnormality. 2.0 cm left adrenal nodule is incompletely characterized on this single phase study, however, this was present on prior CT from May 2004 based on the  available CT report, although the images are not available for review. This lesion is therefore most consistent with a benign adrenal adenoma. Musculoskeletal: No chest wall abnormality. No acute or significant osseous findings. Probable small bone island in the C7 vertebral body. IMPRESSION: 1.  No acute intrathoracic process. 2. Pulmonary arterial hypertension. 3. 4 mm right upper lobe pulmonary nodule. No follow-up needed if patient is low-risk. Non-contrast chest CT can be considered in 12 months if patient is high-risk. This recommendation follows the consensus statement: Guidelines for Management of Incidental Pulmonary Nodules Detected on CT Images: From the Fleischner Society 2017; Radiology 2017; 284:228-243. 4.  Emphysema (ICD10-J43.9). 5.  Aortic atherosclerosis (ICD10-I70.0). Electronically Signed   By: Titus Dubin M.D.   On: 04/27/2018 03:10   Ct Maxillofacial W Contrast  Result Date: 04/27/2018 CLINICAL DATA:  Fever of unknown origin EXAM: CT MAXILLOFACIAL WITH CONTRAST TECHNIQUE: Multidetector CT imaging of the maxillofacial structures was performed with intravenous contrast. Multiplanar CT image reconstructions were also generated. CONTRAST:  188mL OMNIPAQUE IOHEXOL 300 MG/ML  SOLN COMPARISON:  None. FINDINGS: Osseous: No facial fracture. Orbits: Normal. Sinuses --Frontal: No fluid levels or mucosal thickening. Frontal recesses are patent. --Ethmoid: No fluid levels or mucosal thickening. --Sphenoid: No fluid levels or mucosal thickening. Sphenoethmoidal recesses and sphenoid ostia are patent. --Maxillary: No fluid levels or mucosal thickening. Ostiomeatal complexes are patent. --Nasal cavity: No significant septal deviation or abnormal mucosal thickening. --Nasopharynx: Clear. --Mastoid air cells: No effusion. Soft tissues: Normal. Limited intracranial: Normal. IMPRESSION: No evidence of sinusitis. Electronically Signed   By: Ulyses Jarred M.D.   On: 04/27/2018 01:49   Dg Chest Portable 1  View  Result Date: 04/22/2018 CLINICAL DATA:  Shortness of breath EXAM: PORTABLE CHEST 1 VIEW COMPARISON:  01/22/2008 FINDINGS: The lungs are well inflated. There is mild cardiomegaly. There is bibasilar atelectasis. No focal consolidation or pulmonary edema. There is no pleural effusion or pneumothorax. IMPRESSION: Mild cardiomegaly and bibasilar atelectasis. Electronically Signed   By: Ulyses Jarred M.D.   On: 04/22/2018 18:21   Vas [place] Lower Extremity Venous (dvt) (only [facility])  Result Date: 04/23/2018  Lower Venous Study Indications: Swelling, and Edema.  Performing Technologist: Abram Sander RVS  Examination Guidelines: A complete evaluation includes B-mode imaging, spectral Doppler, color Doppler, and power Doppler as needed of all accessible portions of each vessel. Bilateral testing is considered an integral  part of a complete examination. Limited examinations for reoccurring indications may be performed as noted.  Right Venous Findings: +---------+---------------+---------+-----------+----------+-------+          CompressibilityPhasicitySpontaneityPropertiesSummary +---------+---------------+---------+-----------+----------+-------+ CFV      Full           Yes      Yes                          +---------+---------------+---------+-----------+----------+-------+ SFJ      Full                                                 +---------+---------------+---------+-----------+----------+-------+ FV Prox  Full                                                 +---------+---------------+---------+-----------+----------+-------+ FV Mid   Full                                                 +---------+---------------+---------+-----------+----------+-------+ FV DistalFull                                                 +---------+---------------+---------+-----------+----------+-------+ PFV      Full                                                  +---------+---------------+---------+-----------+----------+-------+ POP      Full           Yes      Yes                          +---------+---------------+---------+-----------+----------+-------+ PTV      Full                                                 +---------+---------------+---------+-----------+----------+-------+ PERO     Full                                                 +---------+---------------+---------+-----------+----------+-------+  Left Venous Findings: +---------+---------------+---------+-----------+----------+--------------+          CompressibilityPhasicitySpontaneityPropertiesSummary        +---------+---------------+---------+-----------+----------+--------------+ CFV      Full           Yes      Yes                                 +---------+---------------+---------+-----------+----------+--------------+ SFJ      Full                                                        +---------+---------------+---------+-----------+----------+--------------+  FV Prox  Full                                                        +---------+---------------+---------+-----------+----------+--------------+ FV Mid   Full                                                        +---------+---------------+---------+-----------+----------+--------------+ FV DistalFull                                                        +---------+---------------+---------+-----------+----------+--------------+ PFV      Full                                                        +---------+---------------+---------+-----------+----------+--------------+ POP      Full           Yes      Yes                                 +---------+---------------+---------+-----------+----------+--------------+ PTV      Full                                                        +---------+---------------+---------+-----------+----------+--------------+  PERO                                                  Not visualized +---------+---------------+---------+-----------+----------+--------------+    Summary: Right: There is no evidence of deep vein thrombosis in the lower extremity. No cystic structure found in the popliteal fossa. Left: There is no evidence of deep vein thrombosis in the lower extremity. No cystic structure found in the popliteal fossa.  *See table(s) above for measurements and observations. Electronically signed by Deitra Mayo MD on 04/23/2018 at 7:07:49 AM.    Final     Microbiology: Recent Results (from the past 240 hour(s))  Blood culture (routine x 2)     Status: None   Collection Time: 04/22/18  6:47 PM  Result Value Ref Range Status   Specimen Description BLOOD RIGHT HAND  Final   Special Requests   Final    BOTTLES DRAWN AEROBIC AND ANAEROBIC Blood Culture results may not be optimal due to an inadequate volume of blood received in culture bottles   Culture   Final    NO GROWTH 5 DAYS Performed at [facility], [place] [address], [place], [place] [address]    Report Status 04/27/2018 FINAL  Final  Blood culture (routine x 2)     Status: Abnormal   Collection Time: 04/22/18  6:48 PM  Result Value Ref Range Status   Specimen Description BLOOD LEFT WRIST  Final   Special Requests   Final    BOTTLES DRAWN AEROBIC AND ANAEROBIC Blood Culture adequate volume   Culture  Setup Time   Final    GRAM POSITIVE COCCI IN CLUSTERS AEROBIC BOTTLE ONLY CRITICAL RESULT CALLED TO, READ BACK BY AND VERIFIED WITH: [place], [id number] FCP GRAM POSITIVE RODS ANAEROBIC BOTTLE ONLY CRITICAL RESULT CALLED TO, READ BACK BY AND VERIFIED WITH: PHARMD N BATCHELDER 04/26/18 AT 39 BY CM    Culture (A)  Final    STAPHYLOCOCCUS SPECIES (COAGULASE NEGATIVE) THE SIGNIFICANCE OF ISOLATING THIS ORGANISM FROM A SINGLE SET OF BLOOD CULTURES WHEN MULTIPLE SETS ARE DRAWN IS UNCERTAIN. PLEASE NOTIFY THE MICROBIOLOGY  DEPARTMENT WITHIN ONE WEEK IF SPECIATION AND SENSITIVITIES ARE REQUIRED. ACTINOMYCES NEUII Standardized susceptibility testing for this organism is not available. Performed at [facility], [place] [address], [place], [place] [address]    Report Status 04/24/2018 FINAL  Final  Blood Culture ID Panel (Reflexed)     Status: Abnormal   Collection Time: 04/22/18  6:48 PM  Result Value Ref Range Status   Enterococcus species NOT DETECTED NOT DETECTED Final   Listeria monocytogenes NOT DETECTED NOT DETECTED Final   Staphylococcus species DETECTED (A) NOT DETECTED Final    Comment: Methicillin (oxacillin) susceptible coagulase negative staphylococcus. Possible blood culture contaminant (unless isolated from more than one blood culture draw or clinical case suggests pathogenicity). No antibiotic treatment is indicated for blood  culture contaminants. CRITICAL RESULT CALLED TO, READ BACK BY AND VERIFIED WITH: PHARMD DEJA, [id number] FCP    Staphylococcus aureus (BCID) NOT DETECTED NOT DETECTED Final   Methicillin resistance NOT DETECTED NOT DETECTED Final   Streptococcus species NOT DETECTED NOT DETECTED Final   Streptococcus agalactiae NOT DETECTED NOT DETECTED Final   Streptococcus pneumoniae NOT DETECTED NOT DETECTED Final   Streptococcus pyogenes NOT DETECTED NOT DETECTED Final   Acinetobacter baumannii NOT DETECTED NOT DETECTED Final   Enterobacteriaceae species NOT DETECTED NOT DETECTED Final   Enterobacter cloacae complex NOT DETECTED NOT DETECTED Final   Escherichia coli NOT DETECTED NOT DETECTED Final   Klebsiella oxytoca NOT DETECTED NOT DETECTED Final   Klebsiella pneumoniae NOT DETECTED NOT DETECTED Final   Proteus species NOT DETECTED NOT DETECTED Final   Serratia marcescens NOT DETECTED NOT DETECTED Final   Haemophilus influenzae NOT DETECTED NOT DETECTED Final   Neisseria meningitidis NOT DETECTED NOT DETECTED Final   Pseudomonas aeruginosa NOT DETECTED NOT DETECTED Final    Candida albicans NOT DETECTED NOT DETECTED Final   Candida glabrata NOT DETECTED NOT DETECTED Final   Candida krusei NOT DETECTED NOT DETECTED Final   Candida parapsilosis NOT DETECTED NOT DETECTED Final   Candida tropicalis NOT DETECTED NOT DETECTED Final    Comment: Performed at [facility], [address], [place], [place] [address]     Labs: CBC: Recent Labs  Lab 04/22/18 1739 04/23/18 0243 04/24/18 0259 04/27/18 0233  WBC 12.4* 12.2* 10.0 10.9*  NEUTROABS 9.1*  --   --  6.1  HGB 16.6* 15.7* 15.5* 15.5*  HCT 55.3* 53.2* 52.0* 53.0*  MCV 87.2 87.2 87.7 89.1  PLT 331 344 318 761   Basic Metabolic Panel: Recent Labs  Lab 04/23/18 0243 04/24/18 0259 04/25/18 0218 04/26/18 0223 04/27/18 0233  NA  131* 136 139 138 138  K 4.1 2.8* 3.4* 3.4* 3.6  CL 69* 74* 78* 82* 88*  CO2 44* 46* 47* 42* 37*  GLUCOSE 121* 177* 160* 170* 81  BUN 40* 30* 31* 30* 24*  CREATININE 1.12* 0.99 1.08* 1.15* 0.93  CALCIUM 9.1 8.9 9.0 9.7 8.8*  MG  --   --   --   --  1.7   Liver Function Tests: Recent Labs  Lab 04/22/18 1739 04/27/18 0233  AST 43* 25  ALT 17 21  ALKPHOS 88 68  BILITOT 3.2* 1.2  PROT 7.1 6.2*  ALBUMIN 3.7 3.4*   Recent Labs  Lab 04/22/18 1739  LIPASE 30   No results for input(s): AMMONIA in the last 168 hours. Cardiac Enzymes: No results for input(s): CKTOTAL, CKMB, CKMBINDEX, TROPONINI in the last 168 hours. BNP (last 3 results) Recent Labs    04/22/18 1739  BNP 501.9*   CBG: Recent Labs  Lab 04/26/18 1204 04/26/18 1649 04/26/18 2113 04/27/18 0735 04/27/18 1141  GLUCAP 162* 358* 118* 118* 249*   Time spent: 35 minutes  Signed:  Berle Mull  [facility] 04/27/2018 , 8:22 AM

## 2018-05-01 DIAGNOSIS — R911 Solitary pulmonary nodule: Secondary | ICD-10-CM | POA: Insufficient documentation

## 2018-05-01 DIAGNOSIS — Z006 Encounter for examination for normal comparison and control in clinical research program: Secondary | ICD-10-CM

## 2018-05-18 NOTE — Research (Addendum)
05-01-18 10:00 a.m.  PIVA follow up 5 day post Right Heart Cath. No complications at insertion site. Will continue to follow up with the patient.

## 2018-06-02 ENCOUNTER — Encounter: Payer: Self-pay | Admitting: Pulmonary Disease

## 2018-06-02 ENCOUNTER — Ambulatory Visit: Payer: 59 | Admitting: Pulmonary Disease

## 2018-06-02 VITALS — BP 126/80 | HR 81 | Ht 67.0 in | Wt 220.8 lb

## 2018-06-02 DIAGNOSIS — J432 Centrilobular emphysema: Secondary | ICD-10-CM

## 2018-06-02 DIAGNOSIS — G4733 Obstructive sleep apnea (adult) (pediatric): Secondary | ICD-10-CM

## 2018-06-02 DIAGNOSIS — I272 Pulmonary hypertension, unspecified: Secondary | ICD-10-CM

## 2018-06-02 DIAGNOSIS — J9611 Chronic respiratory failure with hypoxia: Secondary | ICD-10-CM

## 2018-06-02 NOTE — Progress Notes (Signed)
   Subjective:    Patient ID: [name], female    DOB: 03/05/1956, 63 y.o.   MRN: [id number]  HPI    Review of Systems  Constitutional: Negative for fever and unexpected weight change.  HENT: Positive for ear pain, postnasal drip, rhinorrhea, sinus pressure and sneezing. Negative for congestion, dental problem, nosebleeds, sore throat and trouble swallowing.   Eyes: Negative for redness and itching.  Respiratory: Positive for shortness of breath. Negative for cough, chest tightness and wheezing.   Cardiovascular: Positive for leg swelling. Negative for palpitations.  Gastrointestinal: Negative for nausea and vomiting.  Genitourinary: Negative for dysuria.  Musculoskeletal: Negative for joint swelling.  Skin: Negative for rash.  Allergic/Immunologic: Negative.  Negative for environmental allergies, food allergies and immunocompromised state.  Neurological: Positive for headaches.  Hematological: Does not bruise/bleed easily.  Psychiatric/Behavioral: Negative for dysphoric mood. The patient is nervous/anxious.        Objective:   Physical Exam        Assessment & Plan:

## 2018-06-02 NOTE — Assessment & Plan Note (Signed)
Full PFTs with DLCO to quantify. Continue on Anoro for now

## 2018-06-02 NOTE — Assessment & Plan Note (Addendum)
Continue 3 L oxygen during exertion. Would qualify for portable concentrator Referral to pulmonary rehab

## 2018-06-02 NOTE — Assessment & Plan Note (Signed)
Given excessive daytime somnolence, narrow pharyngeal exam & snoring, obstructive sleep apnea is very likely & an overnight polysomnogram will be scheduled as a home study. The pathophysiology of obstructive sleep apnea , it's cardiovascular consequences & modes of treatment including CPAP were discused with the patient in detail & they evidenced understanding.  Study can be performed on room air.  Pretest probability is high .

## 2018-06-02 NOTE — Assessment & Plan Note (Signed)
Does not need vasodilator therapy. We will need to optimize diuresis, COPD and OSA if present

## 2018-06-02 NOTE — Progress Notes (Signed)
Subjective:    Patient ID: [name], female    DOB: May 09, 1956, 63 y.o.   MRN: [id number]  HPI  Chief Complaint  Patient presents with  . Consult    hospitalized in Dec 2019 for legs swelling/SOB. on home 02 at 5L night   63 year old diabetic with known pulmonary hypertension presents to establish care. After recent hospitalization for hypoxic respiratory failure. She was hospitalized 12/14 to 04/27/18 -felt to have acute cor pulmonale due to severe PAH. Echo showed EF of 81%, grade 1 diastolic dysfunction and RVSP of 58. She was seen by advanced heart failure team and underwent right heart cath She had seen her cardiologist on 12/9 prior to admission and diuretics have been adjusted  She was diuresed with torsemide 60 mg twice daily, given IV steroids and transitioned to prednisone taper.  Actinomyces was noted on a blood culture which was thought to be contaminant by ID.  CT sinuses was negative   CT chest with contrast 04/27/2018 showed emphysema and pulmonary arterial hypertension, 4 mm right upper lobe nodule was noted  She lost weight from 242 to currently 220 pounds and feels significantly improved. She reports NYHA class II symptoms. She reports lifelong history of asthma.  She smoked about half pack per day until she quit in 2014, 20 pack years  She was given a diagnosis of COPD couple of years ago and placed on Anoro, during hospitalization this was changed to [facility].  Epworth sleepiness score is 10. Bedtime is around 10 PM, sleep latency can be 1 to 2 hours, she sleeps on her side with one pillow, reports 5-6 nocturnal awakenings due to diuretics and is out of bed by 5:30 AM feeling tired with dryness of mouth but denies headaches. There is no history suggestive of cataplexy, sleep paralysis or parasomnias   She was tearful towards the end of the interview while talking about her job since she has been demoted due to her physical disability and is worried about  getting fired    Oxygen saturation was 92% at rest but dropped to 86% on ambulation, improved with 3 L to 91%  Significant tests/ events reviewed  RHC 04/25/18  RA = 13 RV = 75/14 PA = 75/33 (47) PCW = 23 Fick cardiac output/index = 5.8/2.7 Thermo CO/CI = 8.6/4.1 PVR (Fick) = 4.1 WU PVR (Thermo) = 2.7 WU Ao sat = 96% PA sat = 73%, 73% High SVC sat 67%  Serology -ANA / RA / ANCA neg   Past Medical History:  Diagnosis Date  . ADHD   . Chronic right-sided CHF (congestive heart failure) ([place])   . Depression   . GERD (gastroesophageal reflux disease)   . Migraine   . PAH (pulmonary artery hypertension) ([place])   . Restless leg syndrome      Past Surgical History:  Procedure Laterality Date  . c sections    . CHOLECYSTECTOMY    . RIGHT HEART CATH N/A 04/25/2018   Procedure: RIGHT HEART CATH;  Surgeon: Jolaine Artist, MD;  Location: [facility];  Service: Cardiovascular;  Laterality: N/A;  . TONSILLECTOMY      Allergies  Allergen Reactions  . Asa [Aspirin] Nausea Only    abd pain  . Codeine Nausea Only  . Sulfa Antibiotics Hives    Social History   Socioeconomic History  . Marital status: Married    Spouse name: Statistician  . Number of children: 1  . Years of education: Not on  file  . Highest education level: Not on file  Occupational History  . Not on file  Social Needs  . Financial resource strain: Somewhat hard  . Food insecurity:    Worry: Sometimes true    Inability: Sometimes true  . Transportation needs:    Medical: No    Non-medical: No  Tobacco Use  . Smoking status: Former Smoker    Packs/day: 0.50    Types: Cigarettes  . Smokeless tobacco: Never Used  Substance and Sexual Activity  . Alcohol use: No  . Drug use: No  . Sexual activity: Not Currently  Lifestyle  . Physical activity:    Days per week: 7 days    Minutes per session: 10 min  . Stress: To some extent  Relationships  . Social connections:    Talks on  phone: More than three times a week    Gets together: Once a week    Attends religious service: Never    Active member of club or organization: No    Attends meetings of clubs or organizations: Never    Relationship status: Married  . Intimate partner violence:    Fear of current or ex partner: No    Emotionally abused: No    Physically abused: No    Forced sexual activity: No  Other Topics Concern  . Not on file  Social History Narrative  . Not on file     Family History  Problem Relation Age of Onset  . Alcohol abuse Sister   . Drug abuse Sister   . Mental illness Sister   . Early death Sister   . Pancreatic cancer Mother   . Emphysema Father      Review of Systems Positive for shortness of breath with activity, feet swelling, joint stiffness, nasal congestion  Constitutional: negative for anorexia, fevers and sweats  Eyes: negative for irritation, redness and visual disturbance  Ears, nose, mouth, throat, and face: negative for earaches, epistaxis, nasal congestion and sore throat  Respiratory: negative for cough,sputum and wheezing  Cardiovascular: negative for chest pain,  orthopnea, palpitations and syncope  Gastrointestinal: negative for abdominal pain, constipation, diarrhea, melena, nausea and vomiting  Genitourinary:negative for dysuria, frequency and hematuria  Hematologic/lymphatic: negative for bleeding, easy bruising and lymphadenopathy  Musculoskeletal:negative for arthralgias, muscle weakness  Neurological: negative for coordination problems, gait problems, headaches and weakness  Endocrine: negative for diabetic symptoms including polydipsia, polyuria and weight loss     Objective:   Physical Exam  Gen. Pleasant, obese, in no distress, normal affect ENT - no pallor,icterus, no post nasal drip, class 2-3 airway Neck: No JVD, no thyromegaly, no carotid bruits Lungs: no use of accessory muscles, no dullness to percussion, decreased without rales or  rhonchi  Cardiovascular: Rhythm regular, heart sounds  normal, no murmurs or gallops, no peripheral edema Abdomen: soft and non-tender, no hepatosplenomegaly, BS normal. Musculoskeletal: No deformities, no cyanosis or clubbing Neuro:  alert, non focal, no tremors Skin - stasis discoloration both feet upto mid calves       Assessment & Plan:

## 2018-06-02 NOTE — Patient Instructions (Signed)
  Ambulatory saturation. Based on this we will decide her ability for work. For now use oxygen during sleep  Schedule PFTs. Schedule home sleep study -okay to sleep without oxygen on the night of study  Stay on Anoro once daily Fluid pills as you are currently doing

## 2018-07-03 ENCOUNTER — Encounter (HOSPITAL_COMMUNITY): Payer: Self-pay | Admitting: Psychiatry

## 2018-07-03 ENCOUNTER — Ambulatory Visit (INDEPENDENT_AMBULATORY_CARE_PROVIDER_SITE_OTHER): Payer: 59 | Admitting: Psychiatry

## 2018-07-03 VITALS — BP 120/76 | HR 110 | Ht 67.0 in | Wt 224.0 lb

## 2018-07-03 DIAGNOSIS — F909 Attention-deficit hyperactivity disorder, unspecified type: Secondary | ICD-10-CM | POA: Insufficient documentation

## 2018-07-03 DIAGNOSIS — F341 Dysthymic disorder: Secondary | ICD-10-CM | POA: Diagnosis not present

## 2018-07-03 DIAGNOSIS — F411 Generalized anxiety disorder: Secondary | ICD-10-CM | POA: Insufficient documentation

## 2018-07-03 DIAGNOSIS — F330 Major depressive disorder, recurrent, mild: Secondary | ICD-10-CM | POA: Insufficient documentation

## 2018-07-03 MED ORDER — AMPHETAMINE-DEXTROAMPHETAMINE 10 MG PO TABS: 10.0000 mg | ORAL_TABLET | Freq: Three times a day (TID) | ORAL | 0 refills | Status: DC

## 2018-07-03 MED ORDER — ESCITALOPRAM OXALATE 20 MG PO TABS: 20.0000 mg | ORAL_TABLET | Freq: Every day | ORAL | 0 refills | Status: DC

## 2018-07-03 MED ORDER — ALPRAZOLAM 0.5 MG PO TABS: 0.5000 mg | ORAL_TABLET | Freq: Three times a day (TID) | ORAL | 0 refills | Status: AC | PRN

## 2018-07-03 MED ORDER — ARIPIPRAZOLE 2 MG PO TABS: 2.0000 mg | ORAL_TABLET | Freq: Every day | ORAL | 0 refills | Status: DC

## 2018-07-03 NOTE — Progress Notes (Signed)
Psychiatric Initial Adult Assessment   Patient Identification: [name] MRN:  [id number] Date of Evaluation:  07/03/2018 Referral Source: Dawn Stakes PA-C Chief Complaint:  Anxiety, some depression, work stress Chief Complaint    Establish Care     Visit Diagnosis:    ICD-10-CM   1. Adult ADHD F90.9   2. Dysthymic disorder F34.1     History of Present Illness:  63 yo married female with  Over 10 year hx of depressed mood and excessive worrying which have intensified since her former supervisor retired in 2018. Since that time she has been harassed by another female worker who has been often reporting her to a Careers information officer. The latter, even though he recognizes that woman to be a "bully", has taken her side frequently and  Gradually demoted  [name] so at this time her original salary has been nearly cut in half. Stressful work Conservation officer, nature strain (her husband is disabled) are at a core of herr high anxiety. Patient has been tried on several antidepressants in the past (fluoxertine, paroxetine, sertraline, venlafaxine, duloxetine, bupropion) without much benefit. She has been on escitalopram for close to a year and feels that it at least has helped "some". She also has been prescribed low dose of alprazolam (0.5 mg) prn anxiety/sleep. She takes it 3-4 times per week. No hx of withdrawal sx, denies ever overusing it (prescribed as tid prn). She also has been struggling with lack of concentration, daydreaming, task completion since elementary school. She has only been diagnosed with ADD as an adult and has been prescribed Adderall 20 mg in AM and 10 mg at [facility] for the past 4 years. It has been very beneficial to her without any discernable adverse effects. Patient has no hx of suicidal thoughts/attempts, mania, psychosis. She has never been psychiatrically hospitalized. Her visit is motivated by need to establish a regular mental health follow up as her PCP refuses to continue to  prescribe Adderall and  Alprazolam. Additionally patient reports chronic problems with restless legs. After several mediation trials her previous provider put her on hydrocodone which apparently has helped with RLS?  Associated Signs/Symptoms: Depression Symptoms:  depressed mood, fatigue, difficulty concentrating, anxiety, disturbed sleep, (Hypo) Manic Symptoms:  none Anxiety Symptoms:  Excessive Worry, Psychotic Symptoms:  none PTSD Symptoms: Negative  Past Psychiatric History: see above  Previous Psychotropic Medications: Yes   Substance Abuse History in the last 12 months:  No.  Consequences of Substance Abuse: Negative  Past Medical History:  Past Medical History:  Diagnosis Date  . ADHD   . Chronic right-sided CHF (congestive heart failure) ([name])   . Depression   . GERD (gastroesophageal reflux disease)   . Migraine   . PAH (pulmonary artery hypertension) ([name])   . Restless leg syndrome     Past Surgical History:  Procedure Laterality Date  . c sections    . CHOLECYSTECTOMY    . RIGHT HEART CATH N/A 04/25/2018   Procedure: RIGHT HEART CATH;  Surgeon: Jolaine Artist, MD;  Location: [facility];  Service: Cardiovascular;  Laterality: N/A;  . TONSILLECTOMY      Family Psychiatric History: reviewed  Family History:  Family History  Problem Relation Age of Onset  . Alcohol abuse Sister   . Drug abuse Sister   . Mental illness Sister   . Early death Sister   . Pancreatic cancer Mother   . Emphysema Father     Social History:   Social History  Socioeconomic History  . Marital status: Married    Spouse name: [name]  . Number of children: 1  . Years of education: Not on file  . Highest education level: Some college, no degree  Occupational History  . Not on file  Social Needs  . Financial resource strain: Somewhat hard  . Food insecurity:    Worry: Sometimes true    Inability: Sometimes true  . Transportation needs:    Medical:  No    Non-medical: No  Tobacco Use  . Smoking status: Former Smoker    Packs/day: 0.50    Types: Cigarettes  . Smokeless tobacco: Never Used  Substance and Sexual Activity  . Alcohol use: No  . Drug use: No  . Sexual activity: Not Currently  Lifestyle  . Physical activity:    Days per week: 7 days    Minutes per session: 10 min  . Stress: To some extent  Relationships  . Social connections:    Talks on phone: More than three times a week    Gets together: Once a week    Attends religious service: Never    Active member of club or organization: No    Attends meetings of clubs or organizations: Never    Relationship status: Married  Other Topics Concern  . Not on file  Social History Narrative  . 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 She has been there for past 35 years.  Allergies:   Allergies  Allergen Reactions  . Asa [Aspirin] Nausea Only    abd pain  . Codeine Nausea Only  . Sulfa Antibiotics Hives    Metabolic Disorder Labs: Lab Results  Component Value Date   HGBA1C 6.8 (H) 04/25/2018   MPG 148.46 04/25/2018   No results found for: PROLACTIN No results found for: CHOL, TRIG, HDL, CHOLHDL, VLDL, LDLCALC No results found for: TSH  Therapeutic Level Labs: No results found for: LITHIUM No results found for: CBMZ No results found for: VALPROATE  Current Medications: Current Outpatient Medications  Medication Sig Dispense Refill  . albuterol (PROVENTIL HFA;VENTOLIN HFA) 108 (90 Base) MCG/ACT inhaler INHALE 2 PUFFS INTO THE LUNGS EVERY 6 HOURS AS NEEDED    . ANORO ELLIPTA 62.5-25 MCG/INH AEPB INL 1 PUFF ITL D    . atorvastatin (LIPITOR) 40 MG tablet Take 40 mg by mouth daily.    Marland Kitchen escitalopram (LEXAPRO) 20 MG tablet Take 1 tablet (20 mg total) by mouth daily. 90 tablet 0  . HYDROcodone-acetaminophen (NORCO/VICODIN) 5-325 MG tablet Take 1 tablet by mouth at bedtime as needed (restless legs).    .  metFORMIN (GLUCOPHAGE) 500 MG tablet Take 500 mg by mouth 2 (two) times daily with a meal.    . metolazone (ZAROXOLYN) 2.5 MG tablet Take 2 tablets (5 mg total) by mouth as directed. Take 5 mg on Monday and Thursday AS NEEDED ONLY for weight gain of 3 lbs or edema. 20 tablet 0  . mometasone-formoterol (DULERA) 200-5 MCG/ACT AERO Inhale 1 puff into the lungs 2 (two) times daily. 1 Inhaler 0  . montelukast (SINGULAIR) 10 MG tablet Take 10 mg by mouth daily.    . polyethylene glycol (MIRALAX / GLYCOLAX) packet Take 17 g by mouth daily as needed for mild constipation. 14 each 0  . potassium chloride (K-DUR,KLOR-CON) 10 MEQ tablet Take 20 mEq by mouth daily.    Marland Kitchen rOPINIRole (REQUIP) 0.25 MG tablet Take 1 tablet (0.25 mg  total) by mouth at bedtime. 30 tablet 0  . spironolactone (ALDACTONE) 25 MG tablet Take 1 tablet (25 mg total) by mouth daily. 30 tablet 0  . torsemide (DEMADEX) 20 MG tablet Take 3 tablets (60 mg total) by mouth 2 (two) times daily. 60 tablet 0  . ALPRAZolam (XANAX) 0.5 MG tablet Take 1 tablet (0.5 mg total) by mouth 3 (three) times daily as needed for up to 30 days for anxiety or sleep. 90 tablet 0  . amphetamine-dextroamphetamine (ADDERALL) 10 MG tablet Take 1 tablet (10 mg total) by mouth 3 (three) times daily for 30 days. 90 tablet 0  . ARIPiprazole (ABILIFY) 2 MG tablet Take 1 tablet (2 mg total) by mouth daily for 30 days. 30 tablet 0   No current facility-administered medications for this visit.     Musculoskeletal: Strength & Muscle Tone: within normal limits Gait & Station: normal Patient leans: N/A  Psychiatric Specialty Exam: Review of Systems  Constitutional: Negative.   HENT: Negative.   Eyes: Negative.   Respiratory: Positive for shortness of breath.   Cardiovascular: Negative.   Gastrointestinal: Positive for heartburn.  Genitourinary: Negative.   Musculoskeletal: Positive for myalgias.  Skin: Negative.   Neurological:       Restless legs    Endo/Heme/Allergies: Negative.   Psychiatric/Behavioral: Positive for depression. The patient is nervous/anxious and has insomnia.     Blood pressure 120/76, pulse (!) 110, height 5\' 7"  (1.702 m), weight 224 lb (101.6 kg).Body mass index is 35.08 kg/m.  General Appearance: Well Groomed  Eye Contact:  Good  Speech:  Clear and Coherent  Volume:  Normal  Mood:  Anxious and Depressed  Affect:  Tearful  Thought Process:  Goal Directed  Orientation:  Full (Time, Place, and Person)  Thought Content:  Logical  Suicidal Thoughts:  No  Homicidal Thoughts:  No  Memory:  Immediate;   Good Recent;   Good Remote;   Good  Judgement:  Intact  Insight:  Fair  Psychomotor Activity:  Normal  Concentration:  Concentration: Fair  Recall:  Good  Fund of Knowledge:Good  Language: Good  Akathisia:  Negative  Handed:  Right  AIMS (if indicated):  not done  Assets:  Communication Skills Desire for Improvement Housing Vocational/Educational  ADL's:  Intact  Cognition: WNL  Sleep:  Fair      Assessment and Plan: 63 yo married female with long standing problems with anxiety and depression which worsened 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 She also has a hx of ADD. Her PCP wanted her to be followed by mental health and refused to prescribe alprazolam and Adderall which patient has been on for some time. There is no evidence that she has been abusing either one and actually takes alprazolam very sparingly. No SI now or ever. No hx of mania,psychosis, no alcohol/drug abuse.  Dx: Dysthymic disorder; ADD adult type  Plan: Continue Lexapro 20 mg daily, Adderall 20 mg in AM and 10 mg at noon, alprazolam prn anxiety/sleep. I will add low dose of Abilify (2 mg) to augment Lexapro. I explained to [name] that I will not be prescribing hydrocodone for RLS - not a psychiatric condition - another medication  which her PCP has refused to continue. I have offerd her referral for counseling but she declined. The plan was discussed with patient. I spend 60 minutes in direct face to face clinical contact with the patient and devoted  approximately 50% of this time to explanation of diagnosis, discussion of treatment options and med education. Patient will return to clinic in one month.   Stephanie Acre, MD 2/24/20201:55 PM

## 2018-07-03 NOTE — Patient Instructions (Signed)
Plan:  1. We will try Abilify 2 mg to augment the antidepressant effect of Lexapro. Please to take both of these medications at the same time.  2. Continue to use Lexapro, Xanax and Adderall as you were.

## 2018-07-05 ENCOUNTER — Ambulatory Visit: Payer: 59 | Admitting: Pulmonary Disease

## 2018-07-27 ENCOUNTER — Telehealth (HOSPITAL_COMMUNITY): Payer: Self-pay

## 2018-08-01 ENCOUNTER — Ambulatory Visit (HOSPITAL_COMMUNITY): Payer: Self-pay | Admitting: Psychiatry

## 2018-09-02 NOTE — Patient Instructions (Signed)
Hand Washing Germs such as bacteria, viruses, and parasites are found everywhere. They can be in the air and water. They can also be on surfaces like food, door handles, and your skin. Every day, your hands touch germs. Many of these germs can make you and your family sick. Washing your hands is one of the best ways to lower your risk of getting and sharing germs. When should I wash my hands? You should wash your hands whenever you think they are dirty. You should also wash your hands:  Before: ? Visiting a baby or anyone with a weakened disease-fighting system (immunesystem). ? Putting in and taking out contact lenses.  After: ? Using the bathroom or helping someone else use the bathroom. ? Working or playing outside. ? Touching or taking out the garbage. ? Touching anything dirty around your home. ? Sneezing, coughing, or blowing your nose. ? Using a phone, including your mobile phone. ? Touching an animal, animal food, animal poop, or its toys or leash. ? Touching money. ? Using household cleaners or poisonous chemicals. ? Handling dirty clothes, bedding, or rags. ? Using public transportation. ? Going shopping, especially if you use a shopping cart or basket. ? Shaking hands. ? Handling livestock, such as cows or sheep.  Before and after: ? Preparing food. ? Eating. ? Visiting or taking care of someone who is sick. This includes touching used tissues, toys, and clothes. ? Changing a bandage (dressing). ? Taking care of an injury or wound. ? Giving or taking medicine. ? Preparing a bottle for a baby. ? Feeding a baby or young child. ? Changing a diaper. What is the right way to wash my hands?  1. Wet your hands with clean, running water. Turn off the water or move your hands out of the running water. 2. Apply liquid soap or bar soap to your hands. 3. Rub your hands together quickly to create lather. 4. Keep rubbing your hands together for at least 20 seconds. Thoroughly  scrub all parts of your hands. This includes scrubbing under your fingernails and between your fingers. 5. Rinse your hands with clean, running water. Do this until all the soap is gone. 6. Dry your hands using an air dryer or a clean paper or cloth towel, or let your hands air-dry. Do not use your clothing or a dirty towel to dry your hands. If you are in a public restroom, use your towel:  To turn off the water faucet.  To open the bathroom door. How can I clean my hands if I do not have soap and water?  If soap and clean water are not available, use a hand-washing wipe, spray, or gel (hand sanitizer). Use one that contains at least 60% alcohol. If you are handling food, gels are not recommended as a replacement for hand washing with soap and water. To use these products, follow the directions on the product, and:  Apply enough product to cover your hands.  Make sure you wipe, rub, or spray the product so that it reaches every part of your hands and wrists. Include the backs of your hands, between your fingers, and under your fingernails.  Rub the product onto your hands until it dries. Summary  Every day, your hands touch germs. Many of these germs can make you and your family sick.  Washing your hands is one of the best ways to lower your risk of getting and sharing germs.  If soap and clean water are not available,   use a hand-washing wipe, spray, or gel. This information is not intended to replace advice given to you by your health care provider. Make sure you discuss any questions you have with your health care provider. Document Released: 04/08/2008 Document Revised: 02/02/2017 Document Reviewed: 02/02/2017 Elsevier Interactive Patient Education  2019 [employer]  

## 2018-09-03 NOTE — Progress Notes (Signed)
Virtual Visit via Video   Due to the COVID-19 pandemic, this visit was completed with telemedicine (audio/video) technology to reduce patient and provider exposure as well as to preserve personal protective equipment.  I connected with [name] by a video enabled telemedicine application and verified that I am speaking with the correct person using two identifiers. Location patient: Home Location provider: [facility], Office Persons participating in the virtual visit: Zabria P Lezli, [place], DO Lonell Grandchild, CMA acting as scribe for Dr. Briscoe Deutscher.   I discussed the limitations of evaluation and management by telemedicine and the availability of in person appointments. The patient expressed understanding and agreed to proceed.  Care Team   Patient Care Team: Patient, No Pcp Per as PCP - General (General Practice)  Subjective:   HPI: Patient presents for NEW PATIENT appointment. She was referred by a fiend to our office. She was stable on medications for a long time but when she changed providers they changed up all medications.   She has had increased pain in legs with her RLS. Was recently changed from [place] at night for help with that but was taken off of that and put on Gabapentin. This is not helping at all. She has had increased symptoms both during the day and night. She is not happy with the way that medications are helping at all.   She has had increased b/l leg and arm pain. Started about 4 months ago and has progressed. Leg pain has progressed to the point that she is having a hard time getting off the couch. Arm pain has progressed to the point that she is having a hard time hooking bra. We are going to go back on past medications. We will follow up in a week or so and have labs done. And see if she has any improvement.   Review of Systems  Constitutional: Negative for chills and fever.  HENT: Negative for hearing loss and tinnitus.   Eyes: Negative  for blurred vision and double vision.  Respiratory: Negative for cough and hemoptysis.   Cardiovascular: Negative for chest pain and palpitations.  Gastrointestinal: Negative for nausea and vomiting.  Genitourinary: Negative for dysuria and urgency.  Musculoskeletal: Positive for myalgias.  Skin: Negative for rash.  Neurological: Negative for dizziness and headaches.  Endo/Heme/Allergies: Does not bruise/bleed easily.  Psychiatric/Behavioral: Negative for depression.     Patient Active Problem List   Diagnosis Date Noted  . Vitamin D deficiency 09/09/2018  . GAD (generalized anxiety disorder) 07/03/2018  . Mild episode of recurrent major depressive disorder ([place]) 07/03/2018  . Centrilobular emphysema ([place]) 06/02/2018  . OSA (obstructive sleep apnea) 06/02/2018  . Pulmonary nodule 05/01/2018  . Pulmonary hypertension ([place])   . Morbid obesity ([place])   . Chronic respiratory failure with hypoxia ([place]) 04/22/2018  . Chronic right-sided CHF (congestive heart failure) ([place]) 04/22/2018  . PAH (pulmonary artery hypertension) ([place]) 04/22/2018  . Benzodiazepine dependence, continuous ([place]) 02/22/2018  . Serrated adenoma of colon 08/01/2017  . Non-seasonal allergic rhinitis due to pollen 08/16/2016  . Type 2 diabetes mellitus with circulatory disorder, without long-term current use of insulin ([place]), Rx Metformin 07/19/2016  . Hyperlipidemia associated with type 2 diabetes mellitus ([place]) 07/19/2016  . ADHD (attention deficit hyperactivity disorder) 06/29/2012  . Major depression, recurrent, full remission ([place]) 09/20/2011  . Restless leg syndrome 01/07/2011  . Mild persistent asthma 01/07/2011  . Hiatal hernia 07/28/2006  . Collagenous colitis 07/28/2006    Social History  Tobacco Use  . Smoking status: Former Smoker    Packs/day: 0.50    Types: Cigarettes  . Smokeless tobacco: Never Used  Substance Use Topics  . Alcohol use: No   Current Outpatient Medications:  .  albuterol  (PROVENTIL HFA;VENTOLIN HFA) 108 (90 Base) MCG/ACT inhaler, INHALE 2 PUFFS INTO THE LUNGS EVERY 6 HOURS AS NEEDED, Disp: , Rfl:  .  amphetamine-dextroamphetamine (ADDERALL) 10 MG tablet, Take 1 tablet (10 mg total) by mouth 3 (three) times daily for 30 days., Disp: 90 tablet, Rfl: 0 .  ANORO ELLIPTA 62.5-25 MCG/INH AEPB, INL 1 PUFF ITL D, Disp: , Rfl:  .  ARIPiprazole (ABILIFY) 2 MG tablet, Take 1 tablet (2 mg total) by mouth daily for 30 days., Disp: 30 tablet, Rfl: 0 .  atorvastatin (LIPITOR) 40 MG tablet, Take 40 mg by mouth daily., Disp: , Rfl:  .  escitalopram (LEXAPRO) 20 MG tablet, Take 1 tablet (20 mg total) by mouth daily., Disp: 90 tablet, Rfl: 0 .  gabapentin (NEURONTIN) 300 MG capsule, , Disp: , Rfl:  .  HYDROcodone-acetaminophen (NORCO/VICODIN) 5-325 MG tablet, Take 1 tablet by mouth at bedtime as needed (restless legs)., Disp: , Rfl:  .  metFORMIN (GLUCOPHAGE) 500 MG tablet, Take 500 mg by mouth 2 (two) times daily with a meal., Disp: , Rfl:  .  metolazone (ZAROXOLYN) 2.5 MG tablet, Take 2 tablets (5 mg total) by mouth as directed. Take 5 mg on Monday and Thursday AS NEEDED ONLY for weight gain of 3 lbs or edema., Disp: 20 tablet, Rfl: 0 .  mometasone-formoterol (DULERA) 200-5 MCG/ACT AERO, Inhale 1 puff into the lungs 2 (two) times daily., Disp: 1 Inhaler, Rfl: 0 .  montelukast (SINGULAIR) 10 MG tablet, Take 10 mg by mouth daily., Disp: , Rfl:  .  polyethylene glycol (MIRALAX / GLYCOLAX) packet, Take 17 g by mouth daily as needed for mild constipation., Disp: 14 each, Rfl: 0 .  potassium chloride (K-DUR) 10 MEQ tablet, , Disp: , Rfl:  .  potassium chloride (K-DUR,KLOR-CON) 10 MEQ tablet, Take 20 mEq by mouth daily., Disp: , Rfl:  .  rOPINIRole (REQUIP) 0.25 MG tablet, Take 1 tablet (0.25 mg total) by mouth at bedtime., Disp: 30 tablet, Rfl: 0 .  spironolactone (ALDACTONE) 25 MG tablet, Take 1 tablet (25 mg total) by mouth daily., Disp: 30 tablet, Rfl: 0 .  torsemide (DEMADEX) 20 MG  tablet, Take 3 tablets (60 mg total) by mouth 2 (two) times daily., Disp: 60 tablet, Rfl: 0  Allergies  Allergen Reactions  . Asa [Aspirin] Nausea Only    abd pain  . Codeine Nausea Only  . Sulfa Antibiotics Hives   Objective:   VITALS: Per patient if applicable, see vitals. GENERAL: Alert, appears well and in no acute distress. HEENT: Atraumatic, conjunctiva clear, no obvious abnormalities on inspection of external nose and ears. NECK: Normal movements of the head and neck. CARDIOPULMONARY: No increased WOB. Speaking in clear sentences. I:E ratio WNL.  MS: Moves all visible extremities without noticeable abnormality. PSYCH: Pleasant and cooperative, well-groomed. Speech normal rate and rhythm. Affect is appropriate. Insight and judgement are appropriate. Attention is focused, linear, and appropriate.  NEURO: CN grossly intact. Oriented as arrived to appointment on time with no prompting. Moves both UE equally.  SKIN: No obvious lesions, wounds, erythema, or cyanosis noted on face or hands.  Assessment and Plan:   Vitamin D deficiency Will check labs.  Type 2 diabetes mellitus with circulatory disorder, without long-term current use  of insulin ([place]), Rx Metformin Currently on metformin and tolerating.  Will check A1c and B12 level.  Restless leg syndrome Longstanding history.  Previously well controlled with hydrocodone.  I saw no red flags regarding compliance with controlled substance protocols.  She did try gabapentin more recently with another PCP.  She tells too sedated with this.  Unfortunately, without control of her restless leg syndrome, lack of sleep continues to worsen her other chronic medical issues.  We discussed benefits versus risks of hydrocodone as treatment long-term.  She will sign a controlled substance contract this issue.  Morbid obesity ([place]) We discussed the importance of healthy food choices and exercise as tolerated.  Hyperlipidemia associated with type 2  diabetes mellitus ([place]) Tolerating Lipitor without side effects.  He denied regimen.  Collagenous colitis Previously on Entocort for long period time.  She consider adrenal suppression mass.  Will check cortisol level.  ADHD (attention deficit hyperactivity disorder) Followed by psychiatry.  I explained the patient that I do not feel comfortable prescribing stimulants to the patient with her chronic medical issues.  She understands and will continue to see her specialist.  Orders Placed This Encounter  Procedures  . CBC with Differential/Platelet  . Comprehensive metabolic panel  . Hemoglobin A1c  . Lipid panel  . Iron, TIBC and Ferritin Panel  . TSH  . Vitamin B12  . VITAMIN D 25 Hydroxy (Vit-D Deficiency, Fractures)  . Magnesium  . Phosphorus  . Cortisol  . LDL cholesterol, direct   Meds ordered this encounter  Medications  . HYDROcodone-acetaminophen (NORCO/VICODIN) 5-325 MG tablet    Sig: Take 1 tablet by mouth at bedtime.    Dispense:  30 tablet    Refill:  0   . Reviewed expectations re: course of current medical issues. . Discussed self-management of symptoms. . Outlined signs and symptoms indicating need for more acute intervention. . Patient verbalized understanding and all questions were answered. Marland Kitchen Health Maintenance issues including appropriate healthy diet, exercise, and smoking avoidance were discussed with patient. . See orders for this visit as documented in the electronic medical record. . Patient received an After Visit Summary.  Briscoe Deutscher, DO [place], [place]  Records requested if needed. Time spent: 45 minutes, of which >50% was spent in obtaining information about her symptoms, reviewing her previous labs, evaluations, and treatments, counseling her about her condition (please see the discussed topics above), and developing a plan to further investigate it; she had a number of questions which I addressed.

## 2018-09-04 ENCOUNTER — Ambulatory Visit (INDEPENDENT_AMBULATORY_CARE_PROVIDER_SITE_OTHER): Payer: Self-pay | Admitting: Family Medicine

## 2018-09-04 ENCOUNTER — Other Ambulatory Visit: Payer: Self-pay

## 2018-09-04 ENCOUNTER — Encounter: Payer: Self-pay | Admitting: Family Medicine

## 2018-09-04 VITALS — BP 140/68 | HR 88 | Temp 98.4°F | Ht 67.0 in | Wt 220.0 lb

## 2018-09-04 DIAGNOSIS — J9611 Chronic respiratory failure with hypoxia: Secondary | ICD-10-CM

## 2018-09-04 DIAGNOSIS — R5383 Other fatigue: Secondary | ICD-10-CM

## 2018-09-04 DIAGNOSIS — E1169 Type 2 diabetes mellitus with other specified complication: Secondary | ICD-10-CM

## 2018-09-04 DIAGNOSIS — F902 Attention-deficit hyperactivity disorder, combined type: Secondary | ICD-10-CM

## 2018-09-04 DIAGNOSIS — E559 Vitamin D deficiency, unspecified: Secondary | ICD-10-CM

## 2018-09-04 DIAGNOSIS — E1159 Type 2 diabetes mellitus with other circulatory complications: Secondary | ICD-10-CM

## 2018-09-04 DIAGNOSIS — G2581 Restless legs syndrome: Secondary | ICD-10-CM

## 2018-09-04 DIAGNOSIS — K52831 Collagenous colitis: Secondary | ICD-10-CM

## 2018-09-04 DIAGNOSIS — E785 Hyperlipidemia, unspecified: Secondary | ICD-10-CM

## 2018-09-04 DIAGNOSIS — R5381 Other malaise: Secondary | ICD-10-CM

## 2018-09-04 LAB — COMPREHENSIVE METABOLIC PANEL
ALT: 20 U/L (ref 0–35)
AST: 23 U/L (ref 0–37)
Albumin: 4.1 g/dL (ref 3.5–5.2)
Alkaline Phosphatase: 90 U/L (ref 39–117)
BUN: 21 mg/dL (ref 6–23)
CO2: 40 mEq/L — ABNORMAL HIGH (ref 19–32)
Calcium: 10.1 mg/dL (ref 8.4–10.5)
Chloride: 95 mEq/L — ABNORMAL LOW (ref 96–112)
Creatinine, Ser: 1.09 mg/dL (ref 0.40–1.20)
GFR: 50.89 mL/min — ABNORMAL LOW (ref >60.00)
Glucose, Bld: 128 mg/dL — ABNORMAL HIGH (ref 70–99)
Potassium: 4 mEq/L (ref 3.5–5.1)
Sodium: 144 mEq/L (ref 135–145)
Total Bilirubin: 0.4 mg/dL (ref 0.2–1.2)
Total Protein: 6.7 g/dL (ref 6.0–8.3)

## 2018-09-04 LAB — PHOSPHORUS: Phosphorus: 4.3 mg/dL (ref 2.3–4.6)

## 2018-09-04 LAB — CBC WITH DIFFERENTIAL/PLATELET
Basophils Absolute: 0.1 10*3/uL (ref 0.0–0.1)
Basophils Relative: 0.8 % (ref 0.0–3.0)
Eosinophils Absolute: 0.1 10*3/uL (ref 0.0–0.7)
Eosinophils Relative: 1 % (ref 0.0–5.0)
HCT: 52.7 % — ABNORMAL HIGH (ref 36.0–46.0)
Hemoglobin: 17.4 g/dL — ABNORMAL HIGH (ref 12.0–15.0)
Lymphocytes Relative: 15.1 % (ref 12.0–46.0)
Lymphs Abs: 1.6 10*3/uL (ref 0.7–4.0)
MCHC: 33 g/dL (ref 30.0–36.0)
MCV: 97.3 fl (ref 78.0–100.0)
Monocytes Absolute: 1 10*3/uL (ref 0.1–1.0)
Monocytes Relative: 9.5 % (ref 3.0–12.0)
Neutro Abs: 7.8 10*3/uL — ABNORMAL HIGH (ref 1.4–7.7)
Neutrophils Relative %: 73.6 % (ref 43.0–77.0)
Platelets: 252 10*3/uL (ref 150.0–400.0)
RBC: 5.42 Mil/uL — ABNORMAL HIGH (ref 3.87–5.11)
RDW: 14 % (ref 11.5–15.5)
WBC: 10.5 10*3/uL (ref 4.0–10.5)

## 2018-09-04 LAB — CORTISOL: Cortisol, Plasma: 9 ug/dL

## 2018-09-04 LAB — HEMOGLOBIN A1C: Hgb A1c MFr Bld: 6.5 % (ref 4.6–6.5)

## 2018-09-04 LAB — LIPID PANEL
Cholesterol: 234 mg/dL — ABNORMAL HIGH (ref 0–200)
HDL: 50.6 mg/dL (ref >39.00)
NonHDL: 183.44
Total CHOL/HDL Ratio: 5
Triglycerides: 232 mg/dL — ABNORMAL HIGH (ref 0.0–149.0)
VLDL: 46.4 mg/dL — ABNORMAL HIGH (ref 0.0–40.0)

## 2018-09-04 LAB — TSH: TSH: 2.84 u[IU]/mL (ref 0.35–4.50)

## 2018-09-04 LAB — MAGNESIUM: Magnesium: 1.8 mg/dL (ref 1.5–2.5)

## 2018-09-04 LAB — LDL CHOLESTEROL, DIRECT: Direct LDL: 149 mg/dL

## 2018-09-04 MED ORDER — HYDROCODONE-ACETAMINOPHEN 5-325 MG PO TABS: 1.0000 | ORAL_TABLET | Freq: Every day | ORAL | 0 refills | Status: DC

## 2018-09-05 LAB — IRON,TIBC AND FERRITIN PANEL
%SAT: 13 % (calc) — ABNORMAL LOW (ref 16–45)
Ferritin: 17 ng/mL (ref 16–288)
Iron: 57 ug/dL (ref 45–160)
TIBC: 441 mcg/dL (calc) (ref 250–450)

## 2018-09-05 LAB — VITAMIN D 25 HYDROXY (VIT D DEFICIENCY, FRACTURES): VITD: 27.06 ng/mL — ABNORMAL LOW (ref 30.00–100.00)

## 2018-09-05 LAB — VITAMIN B12: Vitamin B-12: 307 pg/mL (ref 211–911)

## 2018-09-08 ENCOUNTER — Ambulatory Visit: Payer: 59 | Admitting: Family Medicine

## 2018-09-09 ENCOUNTER — Encounter: Payer: Self-pay | Admitting: Family Medicine

## 2018-09-09 DIAGNOSIS — E559 Vitamin D deficiency, unspecified: Secondary | ICD-10-CM | POA: Insufficient documentation

## 2018-09-09 NOTE — Assessment & Plan Note (Signed)
Previously on Entocort for long period time.  She consider adrenal suppression mass.  Will check cortisol level.

## 2018-09-09 NOTE — Assessment & Plan Note (Signed)
Followed by psychiatry.  I explained the patient that I do not feel comfortable prescribing stimulants to the patient with her chronic medical issues.  She understands and will continue to see her specialist.

## 2018-09-09 NOTE — Assessment & Plan Note (Signed)
Currently on metformin and tolerating.  Will check A1c and B12 level.

## 2018-09-09 NOTE — Assessment & Plan Note (Signed)
We discussed the importance of healthy food choices and exercise as tolerated.

## 2018-09-09 NOTE — Assessment & Plan Note (Signed)
Will check labs

## 2018-09-09 NOTE — Assessment & Plan Note (Signed)
Tolerating Lipitor without side effects.  He denied regimen.

## 2018-09-09 NOTE — Assessment & Plan Note (Signed)
Longstanding history.  Previously well controlled with hydrocodone.  I saw no red flags regarding compliance with controlled substance protocols.  She did try gabapentin more recently with another PCP.  She tells too sedated with this.  Unfortunately, without control of her restless leg syndrome, lack of sleep continues to worsen her other chronic medical issues.  We discussed benefits versus risks of hydrocodone as treatment long-term.  She will sign a controlled substance contract this issue.

## 2018-09-12 ENCOUNTER — Ambulatory Visit: Payer: Self-pay | Admitting: Family Medicine

## 2018-09-12 NOTE — Progress Notes (Deleted)
Virtual Visit via Video   Due to the COVID-19 pandemic, this visit was completed with telemedicine (audio/video) technology to reduce patient and provider exposure as well as to preserve personal protective equipment.   I connected with [name] by a video enabled telemedicine application and verified that I am speaking with the correct person using two identifiers. Location patient: Home Location provider: [facility], Office Persons participating in the virtual visit: [name], [name], DO   I discussed the limitations of evaluation and management by telemedicine and the availability of in person appointments. The patient expressed understanding and agreed to proceed.  Care Team   Patient Care Team: Briscoe Deutscher, DO as PCP - General (Family Medicine)  Subjective:   HPI:   ROS   Patient Active Problem List   Diagnosis Date Noted  . Vitamin D deficiency 09/09/2018  . GAD (generalized anxiety disorder) 07/03/2018  . Mild episode of recurrent major depressive disorder ([place]) 07/03/2018  . Centrilobular emphysema ([place]) 06/02/2018  . OSA (obstructive sleep apnea) 06/02/2018  . Pulmonary nodule 05/01/2018  . Pulmonary hypertension ([place])   . Morbid obesity ([place])   . Chronic respiratory failure with hypoxia ([place]) 04/22/2018  . Chronic right-sided CHF (congestive heart failure) ([place]) 04/22/2018  . PAH (pulmonary artery hypertension) ([place]) 04/22/2018  . Benzodiazepine dependence, continuous ([place]) 02/22/2018  . Serrated adenoma of colon 08/01/2017  . Non-seasonal allergic rhinitis due to pollen 08/16/2016  . Type 2 diabetes mellitus with circulatory disorder, without long-term current use of insulin ([place]), Rx Metformin 07/19/2016  . Hyperlipidemia associated with type 2 diabetes mellitus ([place]) 07/19/2016  . ADHD (attention deficit hyperactivity disorder) 06/29/2012  . Major depression, recurrent, full remission ([place]) 09/20/2011  . Restless leg syndrome  01/07/2011  . Mild persistent asthma 01/07/2011  . Hiatal hernia 07/28/2006  . Collagenous colitis 07/28/2006    Social History   Tobacco Use  . Smoking status: Former Smoker    Packs/day: 0.50    Types: Cigarettes  . Smokeless tobacco: Never Used  Substance Use Topics  . Alcohol use: No    Current Outpatient Medications:  .  albuterol (PROVENTIL HFA;VENTOLIN HFA) 108 (90 Base) MCG/ACT inhaler, INHALE 2 PUFFS INTO THE LUNGS EVERY 6 HOURS AS NEEDED, Disp: , Rfl:  .  amphetamine-dextroamphetamine (ADDERALL) 10 MG tablet, Take 1 tablet (10 mg total) by mouth 3 (three) times daily for 30 days., Disp: 90 tablet, Rfl: 0 .  ANORO ELLIPTA 62.5-25 MCG/INH AEPB, INL 1 PUFF ITL D, Disp: , Rfl:  .  atorvastatin (LIPITOR) 40 MG tablet, Take 40 mg by mouth daily., Disp: , Rfl:  .  escitalopram (LEXAPRO) 20 MG tablet, Take 1 tablet (20 mg total) by mouth daily., Disp: 90 tablet, Rfl: 0 .  HYDROcodone-acetaminophen (NORCO/VICODIN) 5-325 MG tablet, Take 1 tablet by mouth at bedtime., Disp: 30 tablet, Rfl: 0 .  metFORMIN (GLUCOPHAGE) 500 MG tablet, Take 500 mg by mouth 2 (two) times daily with a meal., Disp: , Rfl:  .  metolazone (ZAROXOLYN) 2.5 MG tablet, Take 2 tablets (5 mg total) by mouth as directed. Take 5 mg on Monday and Thursday AS NEEDED ONLY for weight gain of 3 lbs or edema., Disp: 20 tablet, Rfl: 0 .  montelukast (SINGULAIR) 10 MG tablet, Take 10 mg by mouth daily., Disp: , Rfl:  .  potassium chloride (K-DUR) 10 MEQ tablet, , Disp: , Rfl:  .  torsemide (DEMADEX) 20 MG tablet, Take 3 tablets (60 mg total) by mouth 2 (  two) times daily., Disp: 60 tablet, Rfl: 0  Allergies  Allergen Reactions  . Asa [Aspirin] Nausea Only    abd pain  . Codeine Nausea Only  . Sulfa Antibiotics Hives    Objective:   VITALS: Per patient if applicable, see vitals. GENERAL: Alert, appears well and in no acute distress. HEENT: Atraumatic, conjunctiva clear, no obvious abnormalities on inspection of external  nose and ears. NECK: Normal movements of the head and neck. CARDIOPULMONARY: No increased WOB. Speaking in clear sentences. I:E ratio WNL.  MS: Moves all visible extremities without noticeable abnormality. PSYCH: Pleasant and cooperative, well-groomed. Speech normal rate and rhythm. Affect is appropriate. Insight and judgement are appropriate. Attention is focused, linear, and appropriate.  NEURO: CN grossly intact. Oriented as arrived to appointment on time with no prompting. Moves both UE equally.  SKIN: No obvious lesions, wounds, erythema, or cyanosis noted on face or hands.  Depression screen [facility] 2/9 09/04/2018  Decreased Interest 3  Down, Depressed, Hopeless 3  PHQ - 2 Score 6  Altered sleeping 3  Tired, decreased energy 3  Change in appetite 0  Feeling bad or failure about yourself  2  Trouble concentrating 0  Moving slowly or fidgety/restless 0  Suicidal thoughts 0  PHQ-9 Score 14  Difficult doing work/chores Somewhat difficult    Assessment and Plan:   There are no diagnoses linked to this encounter.  Marland Kitchen COVID-19 Education: The signs and symptoms of COVID-19 were discussed with the patient and how to seek care for testing if needed. The importance of social distancing was discussed today. . Reviewed expectations re: course of current medical issues. . Discussed self-management of symptoms. . Outlined signs and symptoms indicating need for more acute intervention. . Patient verbalized understanding and all questions were answered. Marland Kitchen Health Maintenance issues including appropriate healthy diet, exercise, and smoking avoidance were discussed with patient. . See orders for this visit as documented in the electronic medical record.  Briscoe Deutscher, DO  Records requested if needed. Time spent: *** minutes, of which >50% was spent in obtaining information about her symptoms, reviewing her previous labs, evaluations, and treatments, counseling her about her condition (please see  the discussed topics above), and developing a plan to further investigate it; she had a number of questions which I addressed.

## 2018-09-15 ENCOUNTER — Ambulatory Visit (INDEPENDENT_AMBULATORY_CARE_PROVIDER_SITE_OTHER): Payer: Self-pay | Admitting: Family Medicine

## 2018-09-15 ENCOUNTER — Other Ambulatory Visit: Payer: Self-pay

## 2018-09-15 ENCOUNTER — Encounter: Payer: Self-pay | Admitting: Family Medicine

## 2018-09-15 DIAGNOSIS — E559 Vitamin D deficiency, unspecified: Secondary | ICD-10-CM

## 2018-09-15 DIAGNOSIS — E785 Hyperlipidemia, unspecified: Secondary | ICD-10-CM

## 2018-09-15 DIAGNOSIS — J9611 Chronic respiratory failure with hypoxia: Secondary | ICD-10-CM

## 2018-09-15 DIAGNOSIS — E1169 Type 2 diabetes mellitus with other specified complication: Secondary | ICD-10-CM

## 2018-09-15 DIAGNOSIS — E1159 Type 2 diabetes mellitus with other circulatory complications: Secondary | ICD-10-CM

## 2018-09-15 DIAGNOSIS — G4733 Obstructive sleep apnea (adult) (pediatric): Secondary | ICD-10-CM

## 2018-09-15 DIAGNOSIS — D751 Secondary polycythemia: Secondary | ICD-10-CM

## 2018-09-15 MED ORDER — METFORMIN HCL 500 MG PO TABS: 500.0000 mg | ORAL_TABLET | Freq: Two times a day (BID) | ORAL | 0 refills | Status: DC

## 2018-09-15 MED ORDER — ATORVASTATIN CALCIUM 40 MG PO TABS: 40.0000 mg | ORAL_TABLET | Freq: Every day | ORAL | 3 refills | Status: DC

## 2018-09-15 NOTE — Progress Notes (Signed)
Virtual Visit via Video   Due to the COVID-19 pandemic, this visit was completed with telemedicine (audio/video) technology to reduce patient and provider exposure as well as to preserve personal protective equipment.   I connected with [name] by a video enabled telemedicine application and verified that I am speaking with the correct person using two identifiers. Location patient: Home Location provider: [facility], Office Persons participating in the virtual visit: Glenda P [name], [place], DO   I discussed the limitations of evaluation and management by telemedicine and the availability of in person appointments. The patient expressed understanding and agreed to proceed.  Care Team   Patient Care Team: Briscoe Deutscher, DO as PCP - General (Family Medicine)  Subjective:   HPI:   Type 2 diabetes mellitus with circulatory disorder, without long-term current use of insulin ([place]), Rx Metformin Currently on metformin and tolerating.    Restless leg syndrome Longstanding history.  Previously well controlled with hydrocodone and restarted after last visit. Doing well.  Morbid obesity ([place]) We discussed the importance of healthy food choices and exercise as tolerated.  Hyperlipidemia associated with type 2 diabetes mellitus [facility]) Patient admitted to not taking statin but will restart.   Lab Results  Component Value Date   CHOL 234 (H) 09/04/2018   HDL 50.60 09/04/2018   LDLDIRECT 149.0 09/04/2018   TRIG 232.0 (H) 09/04/2018   CHOLHDL 5 09/04/2018   Lab Results  Component Value Date   ALT 20 09/04/2018   AST 23 09/04/2018   ALKPHOS 90 09/04/2018   BILITOT 0.4 09/04/2018   ADHD (attention deficit hyperactivity disorder) Followed by psychiatry.  I explained the patient that I do not feel comfortable prescribing stimulants to the patient with her chronic medical issues.  She understands and will continue to see her specialist.  CBC Latest Ref Rng & Units  09/04/2018 04/27/2018 04/24/2018  WBC 4.0 - 10.5 K/uL 10.5 10.9(H) 10.0  Hemoglobin 12.0 - 15.0 g/dL 17.4(H) 15.5(H) 15.5(H)  Hematocrit 36.0 - 46.0 % 52.7(H) 53.0(H) 52.0(H)  Platelets 150.0 - 400.0 K/uL 252.0 298 318   Review of Systems  Constitutional: Negative for chills, fever, malaise/fatigue and weight loss.  Respiratory: Negative for cough, shortness of breath and wheezing.   Cardiovascular: Negative for chest pain, palpitations and leg swelling.  Gastrointestinal: Negative for abdominal pain, constipation, diarrhea, nausea and vomiting.  Genitourinary: Negative for dysuria and urgency.  Musculoskeletal: Negative for joint pain and myalgias.  Skin: Negative for rash.  Neurological: Negative for dizziness and headaches.  Psychiatric/Behavioral: Negative for depression, substance abuse and suicidal ideas. The patient is not nervous/anxious.     Patient Active Problem List   Diagnosis Date Noted  . Polycythemia 09/15/2018  . Vitamin D deficiency 09/09/2018  . GAD (generalized anxiety disorder) 07/03/2018  . Mild episode of recurrent major depressive disorder ([place]) 07/03/2018  . Centrilobular emphysema ([place]) 06/02/2018  . OSA (obstructive sleep apnea) 06/02/2018  . Pulmonary nodule 05/01/2018  . Pulmonary hypertension ([place])   . Morbid obesity ([place])   . Chronic respiratory failure with hypoxia ([place]) 04/22/2018  . Chronic right-sided CHF (congestive heart failure) ([employer]) 04/22/2018  . PAH (pulmonary artery hypertension) ([place]) 04/22/2018  . Benzodiazepine dependence, continuous ([place]) 02/22/2018  . Serrated adenoma of colon 08/01/2017  . Non-seasonal allergic rhinitis due to pollen 08/16/2016  . Type 2 diabetes mellitus with circulatory disorder, without long-term current use of insulin ([place]), Rx Metformin 07/19/2016  . Hyperlipidemia associated with type 2 diabetes mellitus ([place]) 07/19/2016  .  ADHD (attention deficit hyperactivity disorder) 06/29/2012  . Major depression,  recurrent, full remission ([place]) 09/20/2011  . Restless leg syndrome 01/07/2011  . Mild persistent asthma 01/07/2011  . Hiatal hernia 07/28/2006  . Collagenous colitis 07/28/2006    Social History   Tobacco Use  . Smoking status: Former Smoker    Packs/day: 0.50    Types: Cigarettes  . Smokeless tobacco: Never Used  Substance Use Topics  . Alcohol use: No   Current Outpatient Medications:  .  albuterol (PROVENTIL HFA;VENTOLIN HFA) 108 (90 Base) MCG/ACT inhaler, INHALE 2 PUFFS INTO THE LUNGS EVERY 6 HOURS AS NEEDED, Disp: , Rfl:  .  amphetamine-dextroamphetamine (ADDERALL) 10 MG tablet, Take 1 tablet (10 mg total) by mouth 3 (three) times daily for 30 days., Disp: 90 tablet, Rfl: 0 .  ANORO ELLIPTA 62.5-25 MCG/INH AEPB, INL 1 PUFF ITL D, Disp: , Rfl:  .  atorvastatin (LIPITOR) 40 MG tablet, Take 1 tablet (40 mg total) by mouth daily., Disp: 90 tablet, Rfl: 3 .  escitalopram (LEXAPRO) 20 MG tablet, Take 1 tablet (20 mg total) by mouth daily., Disp: 90 tablet, Rfl: 0 .  HYDROcodone-acetaminophen (NORCO/VICODIN) 5-325 MG tablet, Take 1 tablet by mouth at bedtime., Disp: 30 tablet, Rfl: 0 .  metFORMIN (GLUCOPHAGE) 500 MG tablet, Take 1 tablet (500 mg total) by mouth 2 (two) times daily with a meal., Disp: 180 tablet, Rfl: 0 .  metolazone (ZAROXOLYN) 2.5 MG tablet, Take 2 tablets (5 mg total) by mouth as directed. Take 5 mg on Monday and Thursday AS NEEDED ONLY for weight gain of 3 lbs or edema., Disp: 20 tablet, Rfl: 0 .  montelukast (SINGULAIR) 10 MG tablet, Take 10 mg by mouth daily., Disp: , Rfl:  .  potassium chloride (K-DUR) 10 MEQ tablet, , Disp: , Rfl:  .  torsemide (DEMADEX) 20 MG tablet, Take 3 tablets (60 mg total) by mouth 2 (two) times daily., Disp: 60 tablet, Rfl: 0  Allergies  Allergen Reactions  . Asa [Aspirin] Nausea Only    abd pain  . Codeine Nausea Only  . Sulfa Antibiotics Hives   Objective:   VITALS: Per patient if applicable, see vitals. GENERAL: Alert, appears  well and in no acute distress. HEENT: Atraumatic, conjunctiva clear, no obvious abnormalities on inspection of external nose and ears. NECK: Normal movements of the head and neck. CARDIOPULMONARY: No increased WOB. Speaking in clear sentences. I:E ratio WNL.  MS: Moves all visible extremities without noticeable abnormality. PSYCH: Pleasant and cooperative, well-groomed. Speech normal rate and rhythm. Affect is appropriate. Insight and judgement are appropriate. Attention is focused, linear, and appropriate.  NEURO: CN grossly intact. Oriented as arrived to appointment on time with no prompting. Moves both UE equally.  SKIN: No obvious lesions, wounds, erythema, or cyanosis noted on face or hands.  Depression screen [facility] 2/9 09/04/2018  Decreased Interest 3  Down, Depressed, Hopeless 3  PHQ - 2 Score 6  Altered sleeping 3  Tired, decreased energy 3  Change in appetite 0  Feeling bad or failure about yourself  2  Trouble concentrating 0  Moving slowly or fidgety/restless 0  Suicidal thoughts 0  PHQ-9 Score 14  Difficult doing work/chores Somewhat difficult   Assessment and Plan:   [name] was seen today for hyperlipidemia, hypertension and copd.  Diagnoses and all orders for this visit:  Polycythemia Comments: Likely due to chronically low oxygen.   OSA (obstructive sleep apnea), 4LC at night  Morbid obesity (HCC)  Type 2 diabetes  mellitus with other circulatory complication, without long-term current use of insulin (HCC) -     metFORMIN (GLUCOPHAGE) 500 MG tablet; Take 1 tablet (500 mg total) by mouth 2 (two) times daily with a meal.  Chronic respiratory failure with hypoxia (HCC)  Vitamin D deficiency  Hyperlipidemia associated with type 2 diabetes mellitus ([place]) Comments: Has not been taking Lipitor. Will resend. Orders: -     atorvastatin (LIPITOR) 40 MG tablet; Take 1 tablet (40 mg total) by mouth daily.   Marland Kitchen COVID-19 Education: The signs and symptoms of COVID-19 were  discussed with the patient and how to seek care for testing if needed. The importance of social distancing was discussed today. . Reviewed expectations re: course of current medical issues. . Discussed self-management of symptoms. . Outlined signs and symptoms indicating need for more acute intervention. . Patient verbalized understanding and all questions were answered. Marland Kitchen Health Maintenance issues including appropriate healthy diet, exercise, and smoking avoidance were discussed with patient. . See orders for this visit as documented in the electronic medical record.  Briscoe Deutscher, DO  Records requested if needed. Time spent: 25 minutes, of which >50% was spent in obtaining information about her symptoms, reviewing her previous labs, evaluations, and treatments, counseling her about her condition (please see the discussed topics above), and developing a plan to further investigate it; she had a number of questions which I addressed.

## 2018-09-18 ENCOUNTER — Other Ambulatory Visit: Payer: Self-pay | Admitting: Family Medicine

## 2018-09-19 NOTE — Telephone Encounter (Signed)
Last OV 09/15/18 Last refill 05/21/18 Next OV not scheduled

## 2018-09-21 ENCOUNTER — Telehealth (HOSPITAL_COMMUNITY): Payer: Self-pay

## 2018-09-21 MED ORDER — AMPHETAMINE-DEXTROAMPHETAMINE 10 MG PO TABS: 10.0000 mg | ORAL_TABLET | Freq: Three times a day (TID) | ORAL | 0 refills | Status: DC

## 2018-09-21 NOTE — Telephone Encounter (Signed)
[place] patient - patient is calling for a refill on her Adderall. She has a follow up in June and uses [facility] in [place]

## 2018-09-21 NOTE — Telephone Encounter (Signed)
done

## 2018-10-09 ENCOUNTER — Encounter: Payer: Self-pay | Admitting: Family Medicine

## 2018-10-09 ENCOUNTER — Ambulatory Visit (INDEPENDENT_AMBULATORY_CARE_PROVIDER_SITE_OTHER): Payer: Self-pay | Admitting: Family Medicine

## 2018-10-09 ENCOUNTER — Other Ambulatory Visit: Payer: Self-pay

## 2018-10-09 VITALS — Ht 67.0 in | Wt 220.0 lb

## 2018-10-09 DIAGNOSIS — R262 Difficulty in walking, not elsewhere classified: Secondary | ICD-10-CM

## 2018-10-09 DIAGNOSIS — E1159 Type 2 diabetes mellitus with other circulatory complications: Secondary | ICD-10-CM

## 2018-10-09 DIAGNOSIS — J9611 Chronic respiratory failure with hypoxia: Secondary | ICD-10-CM

## 2018-10-09 DIAGNOSIS — D751 Secondary polycythemia: Secondary | ICD-10-CM

## 2018-10-09 DIAGNOSIS — F1320 Sedative, hypnotic or anxiolytic dependence, uncomplicated: Secondary | ICD-10-CM

## 2018-10-09 DIAGNOSIS — M549 Dorsalgia, unspecified: Secondary | ICD-10-CM

## 2018-10-09 DIAGNOSIS — G2581 Restless legs syndrome: Secondary | ICD-10-CM

## 2018-10-09 MED ORDER — HYDROCODONE-ACETAMINOPHEN 5-325 MG PO TABS: 1.0000 | ORAL_TABLET | Freq: Every day | ORAL | 0 refills | Status: DC

## 2018-10-09 NOTE — Patient Instructions (Signed)
[email or web address]

## 2018-10-09 NOTE — Progress Notes (Signed)
Virtual Visit via Video   Due to the COVID-19 pandemic, this visit was completed with telemedicine (audio/video) technology to reduce patient and provider exposure as well as to preserve personal protective equipment.   I connected with Kavitha P Macdougal by a video enabled telemedicine application and verified that I am speaking with the correct person using two identifiers. Location patient: Home Location provider: [facility], Office Persons participating in the virtual visit: Lella P Arilyn, [place], DO Lonell Grandchild, CMA acting as scribe for Dr. Briscoe Deutscher.   I discussed the limitations of evaluation and management by telemedicine and the availability of in person appointments. The patient expressed understanding and agreed to proceed.  Care Team   Patient Care Team: Briscoe Deutscher, DO as PCP - General (Family Medicine)  Subjective:   HPI: she has had increased pain in arms and legs. She does not feel like she can make herself go. It has gotten worse over the last few months.   Type 2 diabetes mellitus with circulatory disorder, without long-term current use of insulin ([place]), Rx Metformin  Currently on metformin and tolerating.   Restless leg syndrome Longstanding history. Previously well controlled with hydrocodone and restarted after initial visit. She has been out for a little bit. She is having increased pain all over body so hard to tell if the medications have been helping. Has noticed that when she ran out of norco it has been increased.   Morbid obesity ([place]) We discussed the importance of healthy food choices and exercise as tolerated at last visit. She has been working on food choices but has not felt like doing anything as far as exercising.   Hyperlipidemia associated with type 2 diabetes mellitus ([place]) Patient restarted statin after last visit.   ADHD (attention deficit hyperactivity disorder) Followed by psychiatry.   Review of Systems    Constitutional: Negative for chills and fever.  HENT: Negative for hearing loss and tinnitus.   Eyes: Negative for blurred vision and double vision.  Respiratory: Negative for cough.   Cardiovascular: Negative for chest pain and palpitations.  Gastrointestinal: Negative for nausea and vomiting.  Genitourinary: Negative for dysuria, frequency and urgency.  Musculoskeletal: Negative for myalgias.  Neurological: Negative for dizziness and headaches.  Psychiatric/Behavioral: Negative for depression and suicidal ideas.    Patient Active Problem List   Diagnosis Date Noted  . Polycythemia 09/15/2018  . Vitamin D deficiency 09/09/2018  . GAD (generalized anxiety disorder) 07/03/2018  . Mild episode of recurrent major depressive disorder ([place]) 07/03/2018  . Centrilobular emphysema ([place]) 06/02/2018  . OSA (obstructive sleep apnea) 06/02/2018  . Pulmonary nodule 05/01/2018  . Pulmonary hypertension ([place])   . Morbid obesity ([place])   . Chronic respiratory failure with hypoxia ([place]) 04/22/2018  . Chronic right-sided CHF (congestive heart failure) ([place]) 04/22/2018  . PAH (pulmonary artery hypertension) ([place]) 04/22/2018  . Benzodiazepine dependence, continuous ([place]) 02/22/2018  . Serrated adenoma of colon 08/01/2017  . Non-seasonal allergic rhinitis due to pollen 08/16/2016  . Type 2 diabetes mellitus with circulatory disorder, without long-term current use of insulin ([place]), Rx Metformin 07/19/2016  . Hyperlipidemia associated with type 2 diabetes mellitus ([place]) 07/19/2016  . ADHD (attention deficit hyperactivity disorder) 06/29/2012  . Major depression, recurrent, full remission ([place]) 09/20/2011  . Restless leg syndrome 01/07/2011  . Mild persistent asthma 01/07/2011  . Hiatal hernia 07/28/2006  . Collagenous colitis 07/28/2006    Social History   Tobacco Use  . Smoking status: Former Smoker  Packs/day: 0.50    Types: Cigarettes  . Smokeless tobacco: Never Used  Substance Use Topics   . Alcohol use: No    Current Outpatient Medications:  .  albuterol (PROVENTIL HFA;VENTOLIN HFA) 108 (90 Base) MCG/ACT inhaler, INHALE 2 PUFFS INTO THE LUNGS EVERY 6 HOURS AS NEEDED, Disp: , Rfl:  .  amphetamine-dextroamphetamine (ADDERALL) 10 MG tablet, Take 1 tablet (10 mg total) by mouth 3 (three) times daily for 30 days., Disp: 90 tablet, Rfl: 0 .  ANORO ELLIPTA 62.5-25 MCG/INH AEPB, INHALE 1 PUFF INTO THE LUNGS DAILY, Disp: 60 each, Rfl: 2 .  atorvastatin (LIPITOR) 40 MG tablet, Take 1 tablet (40 mg total) by mouth daily., Disp: 90 tablet, Rfl: 3 .  escitalopram (LEXAPRO) 20 MG tablet, Take 1 tablet (20 mg total) by mouth daily., Disp: 90 tablet, Rfl: 0 .  metFORMIN (GLUCOPHAGE) 500 MG tablet, Take 1 tablet (500 mg total) by mouth 2 (two) times daily with a meal., Disp: 180 tablet, Rfl: 0 .  metolazone (ZAROXOLYN) 2.5 MG tablet, Take 2 tablets (5 mg total) by mouth as directed. Take 5 mg on Monday and Thursday AS NEEDED ONLY for weight gain of 3 lbs or edema., Disp: 20 tablet, Rfl: 0 .  montelukast (SINGULAIR) 10 MG tablet, Take 10 mg by mouth daily., Disp: , Rfl:  .  potassium chloride (K-DUR) 10 MEQ tablet, , Disp: , Rfl:  .  torsemide (DEMADEX) 20 MG tablet, Take 3 tablets (60 mg total) by mouth 2 (two) times daily., Disp: 60 tablet, Rfl: 0 .  HYDROcodone-acetaminophen (NORCO) 5-325 MG tablet, Take 1 tablet by mouth at bedtime., Disp: 30 tablet, Rfl: 0 .  [START ON 11/08/2018] HYDROcodone-acetaminophen (NORCO/VICODIN) 5-325 MG tablet, Take 1 tablet by mouth at bedtime., Disp: 30 tablet, Rfl: 0  Allergies  Allergen Reactions  . Asa [Aspirin] Nausea Only    abd pain  . Codeine Nausea Only  . Sulfa Antibiotics Hives    Objective:   VITALS: Per patient if applicable, see vitals. GENERAL: Alert and in no acute distress. CARDIOPULMONARY: No increased WOB. Speaking in clear sentences.  PSYCH: Pleasant and cooperative. Speech normal rate and rhythm. Affect is appropriate. Insight and  judgement are appropriate. Attention is focused, linear, and appropriate.  NEURO: Oriented as arrived to appointment on time with no prompting.   Depression screen [facility] 2/9 09/04/2018  Decreased Interest 3  Down, Depressed, Hopeless 3  PHQ - 2 Score 6  Altered sleeping 3  Tired, decreased energy 3  Change in appetite 0  Feeling bad or failure about yourself  2  Trouble concentrating 0  Moving slowly or fidgety/restless 0  Suicidal thoughts 0  PHQ-9 Score 14  Difficult doing work/chores Somewhat difficult   Assessment and Plan:   Sheralyn was seen today for medication refill.  Diagnoses and all orders for this visit:  Type 2 diabetes mellitus with other circulatory complication, without long-term current use of insulin (HCC)  Chronic respiratory failure with hypoxia (HCC)  Restless leg syndrome -     HYDROcodone-acetaminophen (NORCO/VICODIN) 5-325 MG tablet; Take 1 tablet by mouth at bedtime. -     HYDROcodone-acetaminophen (NORCO) 5-325 MG tablet; Take 1 tablet by mouth at bedtime. -     DG Foot 2 Views Left; Future  Benzodiazepine dependence, continuous (HCC)  Acute back pain, unspecified back location, unspecified back pain laterality -     DG Thoracic Spine W/Swimmers; Future -     DG Lumbar Spine 2-3 Views; Future  Difficulty walking -  Ambulatory referral to Physical Therapy  Polycythemia -     Ambulatory referral to Hematology   . COVID-19 Education: The signs and symptoms of COVID-19 were discussed with the patient and how to seek care for testing if needed. The importance of social distancing was discussed today. . Reviewed expectations re: course of current medical issues. . Discussed self-management of symptoms. . Outlined signs and symptoms indicating need for more acute intervention. . Patient verbalized understanding and all questions were answered. Marland Kitchen Health Maintenance issues including appropriate healthy diet, exercise, and smoking avoidance were  discussed with patient. . See orders for this visit as documented in the electronic medical record.  Briscoe Deutscher, DO   Records requested if needed. Time spent: 25 minutes, of which >50% was spent in obtaining information about her symptoms, reviewing her previous labs, evaluations, and treatments, counseling her about her condition (please see the discussed topics above), and developing a plan to further investigate it; she had a number of questions which I addressed.

## 2018-10-10 ENCOUNTER — Other Ambulatory Visit (HOSPITAL_COMMUNITY): Payer: Self-pay | Admitting: Psychiatry

## 2018-10-10 ENCOUNTER — Ambulatory Visit (INDEPENDENT_AMBULATORY_CARE_PROVIDER_SITE_OTHER): Payer: 59 | Admitting: Psychiatry

## 2018-10-10 ENCOUNTER — Encounter: Payer: Self-pay | Admitting: Family Medicine

## 2018-10-10 DIAGNOSIS — F341 Dysthymic disorder: Secondary | ICD-10-CM | POA: Diagnosis not present

## 2018-10-10 DIAGNOSIS — F909 Attention-deficit hyperactivity disorder, unspecified type: Secondary | ICD-10-CM | POA: Diagnosis not present

## 2018-10-10 MED ORDER — ESCITALOPRAM OXALATE 20 MG PO TABS: 20.0000 mg | ORAL_TABLET | Freq: Every day | ORAL | 0 refills | Status: DC

## 2018-10-10 MED ORDER — BUPROPION HCL ER (XL) 150 MG PO TB24: 150.0000 mg | ORAL_TABLET | Freq: Every day | ORAL | 1 refills | Status: DC

## 2018-10-10 MED ORDER — AMPHETAMINE-DEXTROAMPHETAMINE 10 MG PO TABS: 10.0000 mg | ORAL_TABLET | Freq: Three times a day (TID) | ORAL | 0 refills | Status: DC

## 2018-10-10 NOTE — Progress Notes (Signed)
[place] MD/PA/NP OP Progress Note  10/10/2018 2:29 PM [name]  MRN:  [id number] Interview was conducted by phone and I verified that I was speaking with the correct person using two identifiers. I discussed the limitations of evaluation and management by telemedicine and  the availability of in person appointments. Patient expressed understanding and agreed to proceed.  Chief Complaint: Fatigue, weight gain.  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 She also has a hx of ADD. Her PCP wanted her to be followed by mental health and refused to prescribe Adderall which patient has been on for some time. No SI now or ever. No hx of mania,psychosis, no alcohol/drug abuse. She takes Lexapro 20 mg daily, Adderall 20 mg in AM and 10 mg at noon. Addition of Abilify (2 mg) to augment Lexapro did not help and she noticed increased appetite even after she stopped it.  Visit Diagnosis:    ICD-10-CM   1. Dysthymic disorder F34.1   2. Adult ADHD F90.9     Past Psychiatric History: Please refer to intake H&P.  Past Medical History:  Past Medical History:  Diagnosis Date  . ADHD   . Chronic right-sided CHF (congestive heart failure) ([place])   . Depression   . GERD (gastroesophageal reflux disease)   . Migraine   . PAH (pulmonary artery hypertension) ([place])   . Restless leg syndrome     Past Surgical History:  Procedure Laterality Date  . CESAREAN SECTION    . CHOLECYSTECTOMY    . RIGHT HEART CATH N/A 04/25/2018   Procedure: RIGHT HEART CATH;  Surgeon: Jolaine Artist, MD;  Location: [facility];  Service: Cardiovascular;  Laterality: N/A;  . TONSILLECTOMY      Family Psychiatric History: Reviwed.  Family History:  Family History  Problem Relation Age of Onset  . Alcohol abuse Sister   .  Drug abuse Sister   . Mental illness Sister   . Early death Sister   . Pancreatic cancer Mother   . Emphysema Father     Social History:  Social History   Socioeconomic History  . Marital status: Married    Spouse name: Statistician  . Number of children: 1  . Years of education: Not on file  . Highest education level: Some college, no degree  Occupational History    Employer: [employer]  Social Needs  . Financial resource strain: Somewhat hard  . Food insecurity:    Worry: Sometimes true    Inability: Sometimes true  . Transportation needs:    Medical: No    Non-medical: No  Tobacco Use  . Smoking status: Former Smoker    Packs/day: 0.50    Types: Cigarettes  . Smokeless tobacco: Never Used  Substance and Sexual Activity  . Alcohol use: No  . Drug use: No  . Sexual activity: Not Currently  Lifestyle  . Physical activity:    Days per week: 7 days    Minutes per session: 10 min  . Stress: To some extent  Relationships  . Social connections:    Talks on phone: More than three times a week    Gets together: Once a week    Attends religious service: Never    Active member of club or organization: No    Attends meetings of clubs or organizations: Never  Relationship status: Married  Other Topics Concern  . Not on file  Social History Narrative  . Not on file    Allergies:  Allergies  Allergen Reactions  . Asa [Aspirin] Nausea Only    abd pain  . Codeine Nausea Only  . Sulfa Antibiotics Hives    Metabolic Disorder Labs: Lab Results  Component Value Date   HGBA1C 6.5 09/04/2018   MPG 148.46 04/25/2018   No results found for: PROLACTIN Lab Results  Component Value Date   CHOL 234 (H) 09/04/2018   TRIG 232.0 (H) 09/04/2018   HDL 50.60 09/04/2018   CHOLHDL 5 09/04/2018   VLDL 46.4 (H) 09/04/2018   Lab Results  Component Value Date   TSH 2.84 09/04/2018    Therapeutic Level Labs: No results found for: LITHIUM No results found for:  VALPROATE No components found for:  CBMZ  Current Medications: Current Outpatient Medications  Medication Sig Dispense Refill  . albuterol (PROVENTIL HFA;VENTOLIN HFA) 108 (90 Base) MCG/ACT inhaler INHALE 2 PUFFS INTO THE LUNGS EVERY 6 HOURS AS NEEDED    . amphetamine-dextroamphetamine (ADDERALL) 10 MG tablet Take 1 tablet (10 mg total) by mouth 3 (three) times daily for 30 days. 90 tablet 0  . ANORO ELLIPTA 62.5-25 MCG/INH AEPB INHALE 1 PUFF INTO THE LUNGS DAILY 60 each 2  . atorvastatin (LIPITOR) 40 MG tablet Take 1 tablet (40 mg total) by mouth daily. 90 tablet 3  . buPROPion (WELLBUTRIN XL) 150 MG 24 hr tablet Take 1 tablet (150 mg total) by mouth daily for 30 days. 30 tablet 1  . HYDROcodone-acetaminophen (NORCO) 5-325 MG tablet Take 1 tablet by mouth at bedtime. 30 tablet 0  . [START ON 11/08/2018] HYDROcodone-acetaminophen (NORCO/VICODIN) 5-325 MG tablet Take 1 tablet by mouth at bedtime. 30 tablet 0  . metFORMIN (GLUCOPHAGE) 500 MG tablet Take 1 tablet (500 mg total) by mouth 2 (two) times daily with a meal. 180 tablet 0  . metolazone (ZAROXOLYN) 2.5 MG tablet Take 2 tablets (5 mg total) by mouth as directed. Take 5 mg on Monday and Thursday AS NEEDED ONLY for weight gain of 3 lbs or edema. 20 tablet 0  . montelukast (SINGULAIR) 10 MG tablet Take 10 mg by mouth daily.    . potassium chloride (K-DUR) 10 MEQ tablet     . torsemide (DEMADEX) 20 MG tablet Take 3 tablets (60 mg total) by mouth 2 (two) times daily. 60 tablet 0   No current facility-administered medications for this visit.      Psychiatric Specialty Exam: Review of Systems  Constitutional: Positive for malaise/fatigue.       Increased appetite   Neurological:       Restless legs     There were no vitals taken for this visit.There is no height or weight on file to calculate BMI.  General Appearance: NA  Eye Contact:  NA  Speech:  Clear and Coherent  Volume:  Normal  Mood:  Neutral  Affect:  NA  Thought Process:   Goal Directed  Orientation:  Full (Time, Place, and Person)  Thought Content: Logical   Suicidal Thoughts:  No  Homicidal Thoughts:  No  Memory:  Immediate;   Good Recent;   Good Remote;   Good  Judgement:  Fair  Insight:  Fair  Psychomotor Activity:  NA  Concentration:  Concentration: Good  Recall:  Good  Fund of Knowledge: Good  Language: Good  Akathisia:  NA  Handed:  Right  AIMS (if indicated): not  done  Assets:  Communication Skills Desire for Improvement Housing Resilience  ADL's:  Intact  Cognition: WNL  Sleep:  Fair   Screenings: GAD-7     Office Visit from 09/04/2018 in [facility]  Total GAD-7 Score  7    PHQ2-9     Office Visit from 09/04/2018 in [facility]  PHQ-2 Total Score  6  PHQ-9 Total Score  14       Assessment and Plan: 63 yo married female with long standing problems with anxiety and depression which worsened 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 She also has a hx of ADD. Her PCP wanted her to be followed by mental health and refused to prescribe Adderall which patient has been on for some time. No SI now or ever. No hx of mania,psychosis, no alcohol/drug abuse. She takes Lexapro 20 mg daily, Adderall 20 mg in AM and 10 mg at noon. Addition of Abilify (2 mg) to augment Lexapro did not help and she noticed increased appetite even after she stopped it.Marland Kitchen  Dx: Dysthymic disorder; ADD adult type  Plan: Taper Lexapro off and instead start Wellbutrin XL 150 mg. She was on it in distant past but does not remember if it helped. It should not increase appetite/contribute to weight gain. Continue Adderall unchanged. Next visit in 4 weeks.    Stephanie Acre, MD 10/10/2018, 2:29 PM

## 2018-10-11 ENCOUNTER — Telehealth: Payer: Self-pay | Admitting: Family Medicine

## 2018-10-11 NOTE — Telephone Encounter (Signed)
Copied from [place] [id number]. Topic: General - Inquiry >> Oct 11, 2018 12:57 PM [name] D wrote: Reason for CRM: Pt stated that the pharmacy would not fill her rx for HYDROcodone-acetaminophen (NORCO) 5-325 MG tablet for 30 days. They would only give her 7 days worth and if she took the 7 days that would null and void the rest of the month. They did not say why. Pt's tried to pick it up on 10/09/18 and 10/10/18 and was told this both times. Pt is wondering if Dr. Juleen China can help with this. She has not been able to pick up medication yet. Please advise. PO#[phone]  [id number] [address], [address] AT [facility] [address] [phone] (Phone) [phone] (Fax)

## 2018-10-12 NOTE — Telephone Encounter (Signed)
Called patient let her know that I have submitted auth and we should know something within 72 hrs she will call pharmacy tomorrow and see if has processed and let me know.   [place] Setting Key: [id number] - PA Case ID: [id number] Need help? Call us at [phone] Status Sent to [facility] HYDROcodone-Acetaminophen 5-325MG  tablets Form [facility] Electronic Prior Authorization Form (2017 NCPDP)

## 2018-10-17 NOTE — Telephone Encounter (Signed)
Patient has chronic pain. Check database for Rx in past 120 days.

## 2018-10-17 NOTE — Telephone Encounter (Signed)
FYI

## 2018-10-17 NOTE — Telephone Encounter (Signed)
The request for 30 Hydrocodone-Acetaminophen 5/325 mg for 30 days is denied.   Opiod therapy is limited to a 7 day supply and less than 50 MME per days since you are new to therapy. More pills are covered only if you have one of the following dx: 1) cancer 2) end of life pain 3) palliative care 4) sickle cell anemia 5) Dx of traumatic injury 6) post surgical procedure 7) Your prescriber attests that you have received received an opoid in the past 120 days

## 2018-10-31 ENCOUNTER — Ambulatory Visit: Payer: Self-pay | Admitting: Physical Therapy

## 2018-11-05 NOTE — Progress Notes (Signed)
[name] is a 63 y.o. female is here for follow up.  History of Present Illness:   HPI:   Chronic Pain. Legs, RLS. Norco 1 tab q hs helps a lot.   Hx of long-term steroid use for collagenous colitis.   CHF, primary pulmonary HTN, Smoker. ECHO 2019. EF 60-60%. Grade 1 diastolic dysfunction.  GAD. Followed by Psych.  DM2. Lab Results  Component Value Date   HGBA1C 6.5 09/04/2018   HGBA1C 6.8 (H) 04/25/2018   Lab Results  Component Value Date   CREATININE 1.09 09/04/2018   Disability. Unable to complete tasks due to pain, weakness, severe anxiety/depression.   Health Maintenance Due  Topic Date Due  . Hepatitis C Screening  1955-09-06  . PNEUMOCOCCAL POLYSACCHARIDE VACCINE AGE 30-64 HIGH RISK  11/08/1958  . FOOT EXAM  11/08/1966  . OPHTHALMOLOGY EXAM  11/08/1966  . URINE MICROALBUMIN  11/08/1966  . PAP SMEAR-Modifier  11/07/1977  . MAMMOGRAM  11/08/2006   Depression screen PHQ 2/9 09/04/2018  Decreased Interest 3  Down, Depressed, Hopeless 3  PHQ - 2 Score 6  Altered sleeping 3  Tired, decreased energy 3  Change in appetite 0  Feeling bad or failure about yourself  2  Trouble concentrating 0  Moving slowly or fidgety/restless 0  Suicidal thoughts 0  PHQ-9 Score 14  Difficult doing work/chores Somewhat difficult   PMHx, SurgHx, SocialHx, FamHx, Medications, and Allergies were reviewed in the Visit Navigator and updated as appropriate.   Patient Active Problem List   Diagnosis Date Noted  . Dysthymic disorder 10/10/2018  . Polycythemia 09/15/2018  . Vitamin D deficiency 09/09/2018  . Adult ADHD 07/03/2018  . GAD (generalized anxiety disorder) 07/03/2018  . Mild episode of recurrent major depressive disorder ([place]) 07/03/2018  . Centrilobular emphysema ([place]) 06/02/2018  . OSA (obstructive sleep apnea) 06/02/2018  . Pulmonary nodule 05/01/2018  . Pulmonary hypertension ([place])   . Morbid obesity ([place])   . Chronic respiratory failure with hypoxia ([place])  04/22/2018  . Chronic right-sided CHF (congestive heart failure) ([place]) 04/22/2018  . PAH (pulmonary artery hypertension) ([place]) 04/22/2018  . Benzodiazepine dependence, continuous () 02/22/2018  . Serrated adenoma of colon 08/01/2017  . Non-seasonal allergic rhinitis due to pollen 08/16/2016  . Type 2 diabetes mellitus with circulatory disorder, without long-term current use of insulin ([place]), Rx Metformin 07/19/2016  . Hyperlipidemia associated with type 2 diabetes mellitus () 07/19/2016  . ADHD (attention deficit hyperactivity disorder) 06/29/2012  . Major depression, recurrent, full remission ([place]) 09/20/2011  . Restless leg syndrome 01/07/2011  . Mild persistent asthma 01/07/2011  . Hiatal hernia 07/28/2006  . Collagenous colitis 07/28/2006   Social History   Tobacco Use  . Smoking status: Former Smoker    Packs/day: 0.50    Types: Cigarettes  . Smokeless tobacco: Never Used  Substance Use Topics  . Alcohol use: No  . Drug use: No   Current Medications and Allergies   .  albuterol (PROVENTIL HFA;VENTOLIN HFA) 108 (90 Base) MCG/ACT inhaler, INHALE 2 PUFFS INTO THE LUNGS EVERY 6 HOURS AS NEEDED, Disp: , Rfl:  .  amphetamine-dextroamphetamine (ADDERALL) 10 MG tablet, Take 1 tablet (10 mg total) by mouth 3 (three) times daily for 30 days., Disp: 90 tablet, Rfl: 0 .  ANORO ELLIPTA 62.5-25 MCG/INH AEPB, INHALE 1 PUFF INTO THE LUNGS DAILY, Disp: 60 each, Rfl: 2 .  atorvastatin (LIPITOR) 40 MG tablet, Take 1 tablet (40 mg total) by mouth daily., Disp: 90 tablet, Rfl: 3 .  buPROPion (WELLBUTRIN XL) 150 MG 24 hr tablet, Take 1 tablet (150 mg total) by mouth daily for 30 days., Disp: 30 tablet, Rfl: 1 .  metFORMIN (GLUCOPHAGE) 500 MG tablet, Take 1 tablet (500 mg total) by mouth 2 (two) times daily with a meal., Disp: 180 tablet, Rfl: 0 .  metolazone (ZAROXOLYN) 2.5 MG tablet, Take 2 tablets (5 mg total) by mouth as directed. Take 5 mg on Monday and Thursday AS NEEDED ONLY for weight  gain of 3 lbs or edema., Disp: 20 tablet, Rfl: 0 .  montelukast (SINGULAIR) 10 MG tablet, Take 10 mg by mouth daily., Disp: , Rfl:  .  potassium chloride (K-DUR) 10 MEQ tablet, Take 1 tablet (10 mEq total) by mouth once for 1 dose., Disp: 30 tablet, Rfl: 0 .  torsemide (DEMADEX) 20 MG tablet, Take 3 tablets (60 mg total) by mouth 2 (two) times daily., Disp: 60 tablet, Rfl: 0 .  [START ON 11/08/2018] HYDROcodone-acetaminophen (NORCO/VICODIN) 5-325 MG tablet, Take 1 tablet by mouth at bedtime. FOR CHRONIC LEG PAIN, Disp: 30 tablet, Rfl: 0   Allergies  Allergen Reactions  . Asa [Aspirin] Nausea Only    abd pain  . Codeine Nausea Only  . Sulfa Antibiotics Hives   Review of Systems   Pertinent items are noted in the HPI. Otherwise, a complete ROS is negative.  Vitals   Vitals:   11/06/18 1514  BP: 120/70  Pulse: (!) 106  Temp: 98.8 F (37.1 C)  TempSrc: Oral  SpO2: 97%  Weight: 229 lb (103.9 kg)  Height: 5\' 7"  (1.702 m)     Body mass index is 35.87 kg/m.  Physical Exam   Physical Exam Vitals signs and nursing note reviewed.  HENT:     Head: Normocephalic and atraumatic.  Eyes:     Pupils: Pupils are equal, round, and reactive to light.  Neck:     Musculoskeletal: Normal range of motion and neck supple.  Cardiovascular:     Rate and Rhythm: Normal rate and regular rhythm.     Heart sounds: Normal heart sounds.  Pulmonary:     Effort: Pulmonary effort is normal.  Abdominal:     Palpations: Abdomen is soft.  Musculoskeletal:     Right shoulder: She exhibits decreased range of motion.     Left shoulder: She exhibits decreased range of motion.     Lumbar back: She exhibits decreased range of motion.  Skin:    General: Skin is warm.  Psychiatric:        Behavior: Behavior normal.    Results for orders placed or performed in visit on 09/04/18  CBC with Differential/Platelet  Result Value Ref Range   WBC 10.5 4.0 - 10.5 K/uL   RBC 5.42 (H) 3.87 - 5.11 Mil/uL    Hemoglobin 17.4 (H) 12.0 - 15.0 g/dL   HCT 52.7 (H) 36.0 - 46.0 %   MCV 97.3 78.0 - 100.0 fl   MCHC 33.0 30.0 - 36.0 g/dL   RDW 14.0 11.5 - 15.5 %   Platelets 252.0 150.0 - 400.0 K/uL   Neutrophils Relative % 73.6 43.0 - 77.0 %   Lymphocytes Relative 15.1 12.0 - 46.0 %   Monocytes Relative 9.5 3.0 - 12.0 %   Eosinophils Relative 1.0 0.0 - 5.0 %   Basophils Relative 0.8 0.0 - 3.0 %   Neutro Abs 7.8 (H) 1.4 - 7.7 K/uL   Lymphs Abs 1.6 0.7 - 4.0 K/uL   Monocytes Absolute 1.0 0.1 - 1.0  K/uL   Eosinophils Absolute 0.1 0.0 - 0.7 K/uL   Basophils Absolute 0.1 0.0 - 0.1 K/uL  Comprehensive metabolic panel  Result Value Ref Range   Sodium 144 135 - 145 mEq/L   Potassium 4.0 3.5 - 5.1 mEq/L   Chloride 95 (L) 96 - 112 mEq/L   CO2 40 (H) 19 - 32 mEq/L   Glucose, Bld 128 (H) 70 - 99 mg/dL   BUN 21 6 - 23 mg/dL   Creatinine, Ser 1.09 0.40 - 1.20 mg/dL   Total Bilirubin 0.4 0.2 - 1.2 mg/dL   Alkaline Phosphatase 90 39 - 117 U/L   AST 23 0 - 37 U/L   ALT 20 0 - 35 U/L   Total Protein 6.7 6.0 - 8.3 g/dL   Albumin 4.1 3.5 - 5.2 g/dL   Calcium 10.1 8.4 - 10.5 mg/dL   GFR 50.89 (L) >60.00 mL/min  Hemoglobin A1c  Result Value Ref Range   Hgb A1c MFr Bld 6.5 4.6 - 6.5 %  Lipid panel  Result Value Ref Range   Cholesterol 234 (H) 0 - 200 mg/dL   Triglycerides 232.0 (H) 0.0 - 149.0 mg/dL   HDL 50.60 >39.00 mg/dL   VLDL 46.4 (H) 0.0 - 40.0 mg/dL   Total CHOL/HDL Ratio 5    NonHDL 183.44   Iron, TIBC and Ferritin Panel  Result Value Ref Range   Iron 57 45 - 160 mcg/dL   TIBC 441 250 - 450 mcg/dL (calc)   %SAT 13 (L) 16 - 45 % (calc)   Ferritin 17 16 - 288 ng/mL  TSH  Result Value Ref Range   TSH 2.84 0.35 - 4.50 uIU/mL  Vitamin B12  Result Value Ref Range   Vitamin B-12 307 211 - 911 pg/mL  VITAMIN D 25 Hydroxy (Vit-D Deficiency, Fractures)  Result Value Ref Range   VITD 27.06 (L) 30.00 - 100.00 ng/mL  Magnesium  Result Value Ref Range   Magnesium 1.8 1.5 - 2.5 mg/dL  Phosphorus    Result Value Ref Range   Phosphorus 4.3 2.3 - 4.6 mg/dL  Cortisol  Result Value Ref Range   Cortisol, Plasma 9.0 ug/dL  LDL cholesterol, direct  Result Value Ref Range   Direct LDL 149.0 mg/dL   Assessment and Plan   Yaa was seen today for leg pain.  Diagnoses and all orders for this visit:  Centrilobular emphysema ([place])  Restless leg syndrome -     HYDROcodone-acetaminophen (NORCO/VICODIN) 5-325 MG tablet; Take 1 tablet by mouth at bedtime. FOR CHRONIC LEG PAIN  Chronic right-sided CHF (congestive heart failure) ([facility]) -     potassium chloride (K-DUR) 10 MEQ tablet; Take 1 tablet (10 mEq total) by mouth once for 1 dose.  GAD (generalized anxiety disorder)  OSA (obstructive sleep apnea)  Type 2 diabetes mellitus with other circulatory complication, without long-term current use of insulin ([facility])  Collagenous colitis -     predniSONE (DELTASONE) 5 MG tablet; Take 1 tablet (5 mg total) by mouth daily with breakfast.   . Orders and follow up as documented in EpicCare, reviewed diet, exercise and weight control, cardiovascular risk and specific lipid/LDL goals reviewed, reviewed medications and side effects in detail.  . Reviewed expectations re: course of current medical issues. . Outlined signs and symptoms indicating need for more acute intervention. . Patient verbalized understanding and all questions were answered. . Patient received an After Visit Summary.  Briscoe Deutscher, DO [place], [place] [facility] 11/25/2018

## 2018-11-06 ENCOUNTER — Encounter: Payer: Self-pay | Admitting: Family Medicine

## 2018-11-06 ENCOUNTER — Other Ambulatory Visit: Payer: Self-pay

## 2018-11-06 ENCOUNTER — Ambulatory Visit: Payer: 59 | Admitting: Family Medicine

## 2018-11-06 ENCOUNTER — Encounter: Payer: Self-pay | Admitting: Physical Therapy

## 2018-11-06 ENCOUNTER — Ambulatory Visit: Payer: 59 | Admitting: Physical Therapy

## 2018-11-06 VITALS — BP 120/70 | HR 106 | Temp 98.8°F | Ht 67.0 in | Wt 229.0 lb

## 2018-11-06 DIAGNOSIS — F411 Generalized anxiety disorder: Secondary | ICD-10-CM

## 2018-11-06 DIAGNOSIS — R2689 Other abnormalities of gait and mobility: Secondary | ICD-10-CM

## 2018-11-06 DIAGNOSIS — K52831 Collagenous colitis: Secondary | ICD-10-CM

## 2018-11-06 DIAGNOSIS — E1159 Type 2 diabetes mellitus with other circulatory complications: Secondary | ICD-10-CM

## 2018-11-06 DIAGNOSIS — G4733 Obstructive sleep apnea (adult) (pediatric): Secondary | ICD-10-CM

## 2018-11-06 DIAGNOSIS — M6281 Muscle weakness (generalized): Secondary | ICD-10-CM | POA: Diagnosis not present

## 2018-11-06 DIAGNOSIS — I50812 Chronic right heart failure: Secondary | ICD-10-CM

## 2018-11-06 DIAGNOSIS — J432 Centrilobular emphysema: Secondary | ICD-10-CM | POA: Diagnosis not present

## 2018-11-06 DIAGNOSIS — G2581 Restless legs syndrome: Secondary | ICD-10-CM

## 2018-11-06 MED ORDER — POTASSIUM CHLORIDE ER 10 MEQ PO TBCR: 10.0000 meq | EXTENDED_RELEASE_TABLET | Freq: Once | ORAL | 0 refills | Status: DC

## 2018-11-06 MED ORDER — PREDNISONE 5 MG PO TABS: 5.0000 mg | ORAL_TABLET | Freq: Every day | ORAL | 2 refills | Status: DC

## 2018-11-06 MED ORDER — HYDROCODONE-ACETAMINOPHEN 5-325 MG PO TABS: 1.0000 | ORAL_TABLET | Freq: Every day | ORAL | 0 refills | Status: DC

## 2018-11-06 NOTE — Therapy (Signed)
[facility] [address] [place], [place], [address] Phone: [phone]   Fax:  [phone]  Physical Therapy Evaluation  Patient Details  Name: [name] MRN: [id number] Date of Birth: Oct 27, 1955 Referring Provider (PT): Briscoe Deutscher   Encounter Date: 11/06/2018  PT End of Session - 11/06/18 1626    Visit Number  1    Number of Visits  12    Date for PT Re-Evaluation  12/18/18    Authorization Type  UHC    PT Start Time  1400    PT Stop Time  5643    PT Time Calculation (min)  42 min    Activity Tolerance  Patient tolerated treatment well    Behavior During Therapy  [facility] for tasks assessed/performed       Past Medical History:  Diagnosis Date  . ADHD   . Chronic right-sided CHF (congestive heart failure) ([place])   . Depression   . GERD (gastroesophageal reflux disease)   . Migraine   . PAH (pulmonary artery hypertension) ([place])   . Restless leg syndrome     Past Surgical History:  Procedure Laterality Date  . CESAREAN SECTION    . CHOLECYSTECTOMY    . RIGHT HEART CATH N/A 04/25/2018   Procedure: RIGHT HEART CATH;  Surgeon: Jolaine Artist, MD;  Location: [facility];  Service: Cardiovascular;  Laterality: N/A;  . TONSILLECTOMY      There were no vitals filed for this visit.   Subjective Assessment - 11/06/18 1623    Subjective  Pt states increased diffiulty with mobility in last 8 months. She states difficulty getting up/down from chair, increased stiffness. No injury/fall to report. She admits to low activity level, and minimal exercise, as well as several comorbidities. Pt with restless leg, diabetes, CHF, and also states increased diffiuclty with lifting UEs overhead.    Limitations  Lifting;Standing;Walking;House hold activities    Currently in Pain?  No/denies         [facility] PT Assessment - 11/06/18 0001      Assessment   Medical Diagnosis  Difficulty walking    Referring Provider (PT)  Briscoe Deutscher    Prior Therapy  no      Precautions   Precautions  None      Balance Screen   Has the patient fallen in the past 6 months  No      Prior Function   Level of Independence  Independent      Cognition   Overall Cognitive Status  Within Functional Limits for tasks assessed      Posture/Postural Control   Posture Comments  States pain in low back when trying to sit up straight,  decreased lumbar lordosis, rounded shoulders.       ROM / Strength   AROM / PROM / Strength  AROM;Strength      AROM   Overall AROM Comments  Lumbar: mild limitation for flex/ext;  L hip flex and ER mild /moderate limitation, bil hip ext moderate limitation,  Knee: [facility]      Strength   Overall Strength Comments  Hip: 4-/5, knee: 4/5;        Palpation   Palpation comment  mod limitation for Bil HS length; Significant limitation with quad length bil; L hip hypomobile;       Special Tests   Other special tests  Neg LLTT,       Transfers   Five time sit to stand comments  18.94 sec    Comments  regular height chair      Ambulation/Gait   Gait Comments  low endurance, limited thoracic rotation, minimal arm swing, very limited hip extension,                 Objective measurements completed on examination: See above findings.      [facility] Adult PT Treatment/Exercise - 11/06/18 0001      Exercises   Exercises  Knee/Hip      Knee/Hip Exercises: Stretches   Active Hamstring Stretch  3 reps;30 seconds    Active Hamstring Stretch Limitations  seated    Piriformis Stretch  2 reps;30 seconds;Both    Piriformis Stretch Limitations  modified fig 4 stretch;     Other Knee/Hip Stretches  SKTC 30 sec x2 bil;       Knee/Hip Exercises: Seated   Long Arc Quad  10 reps;Both             PT Education - 11/06/18 1626    Education Details  PT POC, exam findings, HEP    Person(s) Educated  Patient    Methods  Verbal cues;Explanation;Demonstration;Handout    Comprehension  Verbalized  understanding;Need further instruction;Tactile cues required;Verbal cues required;Returned demonstration       PT Short Term Goals - 11/06/18 1628      PT SHORT TERM GOAL #1   Title  Pt to be independent wtih initial HEP    Time  2    Period  Weeks    Status  New    Target Date  11/20/18      PT SHORT TERM GOAL #2   Title  Pt to demo ability to stand from chair with proper mechanics, set up, and no use of UEs.    Time  2    Period  Weeks    Status  New    Target Date  11/20/18        PT Long Term Goals - 11/06/18 1629      PT LONG TERM GOAL #1   Title  Pt to be independent with long term HEP    Time  6    Period  Weeks    Status  New    Target Date  12/18/18      PT LONG TERM GOAL #2   Title  Pt to demo increased mobility of L hip and back, to be WNL, to allow pt to cross leg and tie shoes.    Time  6    Period  Weeks    Status  New    Target Date  12/18/18      PT LONG TERM GOAL #3   Title  Pt to demo decreased time for 5x sit to stand test, by at least 3 seconds.    Time  6    Period  Weeks    Status  New    Target Date  12/18/18      PT LONG TERM GOAL #4   Title  Pt to demo increased hip and knee strength to at least 4+/5 to improve endurance and ability with gait.    Time  6    Period  Weeks    Status  New    Target Date  12/18/18             Plan - 11/06/18 1632    Clinical Impression Statement  Pt presents with primary complaint of increased difficulty with mobility. Pt overall deconditioned.  She has stiffness in back and hips, as well as weakness in LEs muscles. She has very stiff gait mechanics, with decreased trunk, arm, and hip movement. She has poor endurance for standing/walking activity, and tires easily, due to weakness and CHF. Pt with decreased ability for full functional actiivites, and wil benefit from skilled PT to improve. pt to benefit from education on HEP to improve mobility and endurance.    Personal Factors and Comorbidities   Comorbidity 1;Comorbidity 2;Fitness    Comorbidities  CHF, diabetic, restless leg,    Examination-Activity Limitations  Bend;Squat;Stairs;Stand;Transfers;Lift;Locomotion Level;Reach Overhead    Examination-Participation Restrictions  Cleaning;Community Activity;Shop;Laundry;Meal Prep    Stability/Clinical Decision Making  Evolving/Moderate complexity    Clinical Decision Making  Moderate    Rehab Potential  Good    PT Frequency  2x / week    PT Duration  6 weeks    PT Treatment/Interventions  ADLs/Self Care Home Management;Cryotherapy;Electrical Stimulation;Iontophoresis 4mg /ml Dexamethasone;Moist Heat;Therapeutic activities;Functional mobility training;Stair training;Gait training;DME Instruction;Ultrasound;Traction;Therapeutic exercise;Balance training;Neuromuscular re-education;Patient/family education;Orthotic Fit/Training;Dry needling;Passive range of motion;Manual techniques;Taping;Spinal Manipulations;Joint Manipulations    Consulted and Agree with Plan of Care  Patient       Patient will benefit from skilled therapeutic intervention in order to improve the following deficits and impairments:  Abnormal gait, Decreased endurance, Hypomobility, Obesity, Decreased activity tolerance, Decreased strength, Impaired UE functional use, Increased muscle spasms, Difficulty walking, Decreased mobility, Decreased balance, Decreased range of motion, Improper body mechanics, Impaired flexibility  Visit Diagnosis: 1. Other abnormalities of gait and mobility   2. Muscle weakness (generalized)        Problem List Patient Active Problem List   Diagnosis Date Noted  . Dysthymic disorder 10/10/2018  . Polycythemia 09/15/2018  . Vitamin D deficiency 09/09/2018  . Adult ADHD 07/03/2018  . GAD (generalized anxiety disorder) 07/03/2018  . Mild episode of recurrent major depressive disorder ([place]) 07/03/2018  . Centrilobular emphysema ([place]) 06/02/2018  . OSA (obstructive sleep apnea) 06/02/2018  .  Pulmonary nodule 05/01/2018  . Pulmonary hypertension ([place])   . Morbid obesity ([place])   . Chronic respiratory failure with hypoxia ([place]) 04/22/2018  . Chronic right-sided CHF (congestive heart failure) ([place]) 04/22/2018  . PAH (pulmonary artery hypertension) ([place]) 04/22/2018  . Benzodiazepine dependence, continuous ([place]) 02/22/2018  . Serrated adenoma of colon 08/01/2017  . Non-seasonal allergic rhinitis due to pollen 08/16/2016  . Type 2 diabetes mellitus with circulatory disorder, without long-term current use of insulin ([place]), Rx Metformin 07/19/2016  . Hyperlipidemia associated with type 2 diabetes mellitus ([place]) 07/19/2016  . ADHD (attention deficit hyperactivity disorder) 06/29/2012  . Major depression, recurrent, full remission ([place]) 09/20/2011  . Restless leg syndrome 01/07/2011  . Mild persistent asthma 01/07/2011  . Hiatal hernia 07/28/2006  . Collagenous colitis 07/28/2006    Lyndee Hensen, PT, DPT 4:37 PM  11/06/18    [place], [place], [address] Phone: [phone]   Fax:  [phone]  Name: DANUTA HUSEMAN MRN: [id number] Date of Birth: [birth year]

## 2018-11-06 NOTE — Patient Instructions (Signed)
Health Maintenance Due  Topic Date Due  . Hepatitis C Screening  12/23/1955  . PNEUMOCOCCAL POLYSACCHARIDE VACCINE AGE 62-64 HIGH RISK  11/08/1958  . FOOT EXAM  11/08/1966  . OPHTHALMOLOGY EXAM  11/08/1966  . URINE MICROALBUMIN  11/08/1966  . PAP SMEAR-Modifier  11/07/1977  . MAMMOGRAM  11/08/2006   Pt will discuss during visit Depression screen [facility] 2/9 09/04/2018  Decreased Interest 3  Down, Depressed, Hopeless 3  PHQ - 2 Score 6  Altered sleeping 3  Tired, decreased energy 3  Change in appetite 0  Feeling bad or failure about yourself  2  Trouble concentrating 0  Moving slowly or fidgety/restless 0  Suicidal thoughts 0  PHQ-9 Score 14  Difficult doing work/chores Somewhat difficult

## 2018-11-06 NOTE — Patient Instructions (Signed)
Access Code: [email or web address]  URL: [email or web address]  Date: 11/06/2018  Prepared by: Lyndee Hensen   Exercises Hip Flexion Stretch - 3 reps - 30 hold - 2x daily Supine Figure 4 Piriformis Stretch - 3 reps - 30 hold - 2x daily Seated Hamstring Stretch - 3 reps - 30 hold - 2x daily Seated Knee Extension AROM - 10 reps - 1 sets - 2x daily

## 2018-11-07 NOTE — Telephone Encounter (Signed)
Patient calling to find out when her hydrocodone will be filled?

## 2018-11-08 NOTE — Telephone Encounter (Signed)
Ok to refill 

## 2018-11-08 NOTE — Telephone Encounter (Signed)
This has been written. She is having a difficult time with insurance/pharmacy. Please help her to get this straightened out. I did make sure to put CHRONIC PAIN on the script.

## 2018-11-08 NOTE — Telephone Encounter (Signed)
Called patient let her know I spoke to pharmacy and it will need another auth. We will work on that and let her know what the response for that is.

## 2018-11-08 NOTE — Telephone Encounter (Signed)
PMP website checked and rx was last filled 09/04/18.  See message  Below from [place] about the only "approved conditions" that warrant the coverage of more than a 7 day supply of medication.

## 2018-11-09 ENCOUNTER — Ambulatory Visit: Payer: 59 | Admitting: Physical Therapy

## 2018-11-09 ENCOUNTER — Encounter: Payer: Self-pay | Admitting: Physical Therapy

## 2018-11-09 ENCOUNTER — Other Ambulatory Visit: Payer: Self-pay

## 2018-11-09 VITALS — BP 150/84 | HR 109

## 2018-11-09 DIAGNOSIS — R2689 Other abnormalities of gait and mobility: Secondary | ICD-10-CM

## 2018-11-09 DIAGNOSIS — M6281 Muscle weakness (generalized): Secondary | ICD-10-CM

## 2018-11-09 NOTE — Telephone Encounter (Signed)
Called patient let her know that we still have not received auth for 30 day supply she will pick up 7 day tonight that her insurance will cover and we will adjust and send in new scrip when Josem Kaufmann has been received. I have called pharmacy and let them know ok to fill and patient is aware.

## 2018-11-09 NOTE — Therapy (Addendum)
[facility] [address] [place], [place], [address] Phone: [phone]   Fax:  [phone]  Physical Therapy Treatment  Patient Details  Name: [name] MRN: [id number] Date of Birth: 09/24/1955 Referring Provider (PT): Briscoe Deutscher   Encounter Date: 11/09/2018  PT End of Session - 11/09/18 1444    Visit Number  2    Number of Visits  12    Date for PT Re-Evaluation  12/18/18    Authorization Type  UHC    PT Start Time  7902    PT Stop Time  1510    PT Time Calculation (min)  50 min       Past Medical History:  Diagnosis Date  . ADHD   . Chronic right-sided CHF (congestive heart failure) ([place])   . Depression   . GERD (gastroesophageal reflux disease)   . Migraine   . PAH (pulmonary artery hypertension) ([place])   . Restless leg syndrome     Past Surgical History:  Procedure Laterality Date  . CESAREAN SECTION    . CHOLECYSTECTOMY    . RIGHT HEART CATH N/A 04/25/2018   Procedure: RIGHT HEART CATH;  Surgeon: Jolaine Artist, MD;  Location: [facility];  Service: Cardiovascular;  Laterality: N/A;  . TONSILLECTOMY      Vitals:   11/09/18 1505 11/09/18 1506 11/09/18 1507  BP: (!) 170/90 (!) 160/90 (!) 150/84  Pulse: (!) 109    SpO2: 90% (!) 89%     Subjective Assessment - 11/09/18 1535    Subjective  Pt states she is tired today. She did some of HEP, legs are a bit sore.    Currently in Pain?  No/denies                       [facility] Adult PT Treatment/Exercise - 11/09/18 1425      Transfers   Five time sit to stand comments   --    Comments  --      Ambulation/Gait   Gait Comments  --      Posture/Postural Control   Posture Comments  --      Exercises   Exercises  Knee/Hip      Knee/Hip Exercises: Stretches   Active Hamstring Stretch  3 reps;30 seconds    Active Hamstring Stretch Limitations  seated    Piriformis Stretch  30 seconds;Both;4 reps    Piriformis Stretch  Limitations  modified fig 4 stretch;     Other Knee/Hip Stretches  --      Knee/Hip Exercises: Standing   Heel Raises  15 reps    Hip Flexion  20 reps    Hip Flexion Limitations  March with UE support    Hip Abduction  10 reps;Both    Forward Step Up  10 reps;Both;Step Height: 6";Hand Hold: 1      Knee/Hip Exercises: Seated   Long Arc Quad  --    Sit to [employer]  5 reps;2 sets      Knee/Hip Exercises: Supine   Other Supine Knee/Hip Exercises  Supine AAROM, shoulder flexion with 2 hands x5 (for L shoulder pain and stiffness)                PT Short Term Goals - 11/06/18 1628      PT SHORT TERM GOAL #1   Title  Pt to be independent wtih initial HEP    Time  2    Period  Weeks    Status  New    Target Date  11/20/18      PT SHORT TERM GOAL #2   Title  Pt to demo ability to stand from chair with proper mechanics, set up, and no use of UEs.    Time  2    Period  Weeks    Status  New    Target Date  11/20/18        PT Long Term Goals - 11/06/18 1629      PT LONG TERM GOAL #1   Title  Pt to be independent with long term HEP    Time  6    Period  Weeks    Status  New    Target Date  12/18/18      PT LONG TERM GOAL #2   Title  Pt to demo increased mobility of L hip and back, to be WNL, to allow pt to cross leg and tie shoes.    Time  6    Period  Weeks    Status  New    Target Date  12/18/18      PT LONG TERM GOAL #3   Title  Pt to demo decreased time for 5x sit to stand test, by at least 3 seconds.    Time  6    Period  Weeks    Status  New    Target Date  12/18/18      PT LONG TERM GOAL #4   Title  Pt to demo increased hip and knee strength to at least 4+/5 to improve endurance and ability with gait.    Time  6    Period  Weeks    Status  New    Target Date  12/18/18            Plan - 11/09/18 1536    Clinical Impression Statement  Pt requires seated rest breaks during ther ex. She has improved ability for sit to stand today. After about 25  min, pt states that she was hot, she sat down to rest, stated slight dizziness, but no other symptoms. Pt given cold cloth on neck, water, and seated rest break. BP 170/90 at this time. Pt stated that symptoms were subsiding as she rested, other BPs taken were lower, final being at 150/84. She was educated on monitoring self in the next few days for other occurance of not feeling well. She agrees to call and get MD appt if it happens again. She states that she only ate a small bowl of cereal early this am. Pt denies any sympotms prior to leaving clinic, agrees she can drive herself home, pt walked out to waiting room for end of appt.    Personal Factors and Comorbidities  Comorbidity 1;Comorbidity 2;Fitness    Comorbidities  CHF, diabetic, restless leg,    Examination-Activity Limitations  Bend;Squat;Stairs;Stand;Transfers;Lift;Locomotion Level;Reach Overhead    Examination-Participation Restrictions  Cleaning;Community Activity;Shop;Laundry;Meal Prep    Stability/Clinical Decision Making  Evolving/Moderate complexity    Rehab Potential  Good    PT Frequency  2x / week    PT Duration  6 weeks    PT Treatment/Interventions  ADLs/Self Care Home Management;Cryotherapy;Electrical Stimulation;Iontophoresis 21m/ml Dexamethasone;Moist Heat;Therapeutic activities;Functional mobility training;Stair training;Gait training;DME Instruction;Ultrasound;Traction;Therapeutic exercise;Balance training;Neuromuscular re-education;Patient/family education;Orthotic Fit/Training;Dry needling;Passive range of motion;Manual techniques;Taping;Spinal Manipulations;Joint Manipulations    Consulted and Agree with Plan of Care  Patient       Patient will benefit from skilled therapeutic intervention in order  to improve the following deficits and impairments:  Abnormal gait, Decreased endurance, Hypomobility, Obesity, Decreased activity tolerance, Decreased strength, Impaired UE functional use, Increased muscle spasms, Difficulty  walking, Decreased mobility, Decreased balance, Decreased range of motion, Improper body mechanics, Impaired flexibility  Visit Diagnosis: 1. Other abnormalities of gait and mobility   2. Muscle weakness (generalized)        Problem List Patient Active Problem List   Diagnosis Date Noted  . Dysthymic disorder 10/10/2018  . Polycythemia 09/15/2018  . Vitamin D deficiency 09/09/2018  . Adult ADHD 07/03/2018  . GAD (generalized anxiety disorder) 07/03/2018  . Mild episode of recurrent major depressive disorder ([place]) 07/03/2018  . Centrilobular emphysema ([place]) 06/02/2018  . OSA (obstructive sleep apnea) 06/02/2018  . Pulmonary nodule 05/01/2018  . Pulmonary hypertension ([place])   . Morbid obesity ([place])   . Chronic respiratory failure with hypoxia ([place]) 04/22/2018  . Chronic right-sided CHF (congestive heart failure) ([place]) 04/22/2018  . PAH (pulmonary artery hypertension) ([place]) 04/22/2018  . Benzodiazepine dependence, continuous ([place]) 02/22/2018  . Serrated adenoma of colon 08/01/2017  . Non-seasonal allergic rhinitis due to pollen 08/16/2016  . Type 2 diabetes mellitus with circulatory disorder, without long-term current use of insulin ([place]), Rx Metformin 07/19/2016  . Hyperlipidemia associated with type 2 diabetes mellitus ([place]) 07/19/2016  . ADHD (attention deficit hyperactivity disorder) 06/29/2012  . Major depression, recurrent, full remission ([place]) 09/20/2011  . Restless leg syndrome 01/07/2011  . Mild persistent asthma 01/07/2011  . Hiatal hernia 07/28/2006  . Collagenous colitis 07/28/2006   Lyndee Hensen, PT, DPT 3:40 PM  11/09/18    [facility] [place], [place], [address] Phone: [phone]   Fax:  [phone]  Name: SEIRA CODY MRN: [id number] Date of Birth: March 14, 1956   PHYSICAL THERAPY DISCHARGE SUMMARY  Visits from Start of Care:2   Plan: Patient agrees to discharge.  Patient goals were not  met. Patient is being discharged due to not returning since the last visit.  ?????    Lyndee Hensen, PT, DPT 1:23 PM  02/13/19

## 2018-11-09 NOTE — Telephone Encounter (Signed)
auth restarted will f/u in a little bit to see if approved.  eggy Zazueta Key: [id number] - PA Case ID: [id number] Need help? Call us at [phone] Status Sent to [facility] HYDROcodone-Acetaminophen 5-325MG  tablets Form [employer] Electronic Prior Authorization Form (2017 [employer])

## 2018-11-13 ENCOUNTER — Encounter: Payer: 59 | Admitting: Physical Therapy

## 2018-11-13 ENCOUNTER — Ambulatory Visit: Payer: Self-pay | Admitting: Family Medicine

## 2018-11-14 ENCOUNTER — Ambulatory Visit (INDEPENDENT_AMBULATORY_CARE_PROVIDER_SITE_OTHER): Payer: 59 | Admitting: Psychiatry

## 2018-11-14 ENCOUNTER — Other Ambulatory Visit: Payer: Self-pay

## 2018-11-14 DIAGNOSIS — F900 Attention-deficit hyperactivity disorder, predominantly inattentive type: Secondary | ICD-10-CM | POA: Diagnosis not present

## 2018-11-14 DIAGNOSIS — F341 Dysthymic disorder: Secondary | ICD-10-CM | POA: Diagnosis not present

## 2018-11-14 MED ORDER — BUPROPION HCL ER (XL) 300 MG PO TB24: 300.0000 mg | ORAL_TABLET | Freq: Every day | ORAL | 0 refills | Status: DC

## 2018-11-14 MED ORDER — AMPHETAMINE-DEXTROAMPHETAMINE 10 MG PO TABS: 10.0000 mg | ORAL_TABLET | Freq: Three times a day (TID) | ORAL | 0 refills | Status: DC

## 2018-11-14 NOTE — Progress Notes (Signed)
Tina MD/PA/NP OP Progress Note  11/14/2018 9:08 AM [name]  MRN:  [id number] Interview was conducted by phone and I verified that I was speaking with the correct person using two identifiers. I discussed the limitations of evaluation and management by telemedicine and  the availability of in person appointments. Patient expressed understanding and agreed to proceed.  Chief Complaint:  Fatigue, some depression.  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 She also has a hx of ADD. Her PCP wanted her to be followed by mental health and refused to prescribe Adderall which patient has been on for some time. No SI now or ever. No hx of mania,psychosis, no alcohol/drug abuse. She takes Lexapro 20 mg daily, Adderall 20 mg in AM and 10 mg at noon. Addition of Abilify (2 mg) to augment Lexapro did not help and she noticed increased appetite even after she stopped it. In the oast she tried several SSRIs/SNRIs as well as Trintellix with questionable benefit. She was also on bupropion but could not recall if it was helpful and given her chronic fatigue we decided to try it again. She started on Wellbutrin XL 150 mg and tolerates it well. No clear improvement in mood or fatigue noticed..  Visit Diagnosis:    ICD-10-CM   1. Dysthymic disorder  F34.1   2. Attention deficit hyperactivity disorder (ADHD), predominantly inattentive type  F90.0     Past Psychiatric History: Please see intake H&P.  Past Medical History:  Past Medical History:  Diagnosis Date  . ADHD   . Chronic right-sided CHF (congestive heart failure) ([place])   . Depression   . GERD (gastroesophageal reflux disease)   . Migraine   . PAH (pulmonary artery hypertension) ([place])   . Restless leg syndrome     Past Surgical History:  Procedure  Laterality Date  . CESAREAN SECTION    . CHOLECYSTECTOMY    . RIGHT HEART CATH N/A 04/25/2018   Procedure: RIGHT HEART CATH;  Surgeon: Jolaine Artist, MD;  Location: [facility];  Service: Cardiovascular;  Laterality: N/A;  . TONSILLECTOMY      Family Psychiatric History: Reviewed.  Family History:  Family History  Problem Relation Age of Onset  . Alcohol abuse Sister   . Drug abuse Sister   . Mental illness Sister   . Early death Sister   . Pancreatic cancer Mother   . Emphysema Father     Social History:  Social History   Socioeconomic History  . Marital status: Married    Spouse name: Statistician  . Number of children: 1  . Years of education: Not on file  . Highest education level: Some college, no degree  Occupational History    Employer: [employer]  Social Needs  . Financial resource strain: Somewhat hard  . Food insecurity    Worry: Sometimes true    Inability: Sometimes true  . Transportation needs    Medical: No    Non-medical: No  Tobacco Use  . Smoking status: Former Smoker    Packs/day: 0.50    Types: Cigarettes  . Smokeless tobacco: Never Used  Substance and Sexual Activity  . Alcohol use: No  . Drug use: No  . Sexual activity: Not Currently  Lifestyle  . Physical activity    Days per week: 7 days  Minutes per session: 10 min  . Stress: To some extent  Relationships  . Social connections    Talks on phone: More than three times a week    Gets together: Once a week    Attends religious service: Never    Active member of club or organization: No    Attends meetings of clubs or organizations: Never    Relationship status: Married  Other Topics Concern  . Not on file  Social History Narrative  . Not on file    Allergies:  Allergies  Allergen Reactions  . Asa [Aspirin] Nausea Only    abd pain  . Codeine Nausea Only  . Sulfa Antibiotics Hives    Metabolic Disorder Labs: Lab Results  Component Value Date   HGBA1C 6.5  09/04/2018   MPG 148.46 04/25/2018   No results found for: PROLACTIN Lab Results  Component Value Date   CHOL 234 (H) 09/04/2018   TRIG 232.0 (H) 09/04/2018   HDL 50.60 09/04/2018   CHOLHDL 5 09/04/2018   VLDL 46.4 (H) 09/04/2018   Lab Results  Component Value Date   TSH 2.84 09/04/2018    Therapeutic Level Labs: No results found for: LITHIUM No results found for: VALPROATE No components found for:  CBMZ  Current Medications: Current Outpatient Medications  Medication Sig Dispense Refill  . albuterol (PROVENTIL HFA;VENTOLIN HFA) 108 (90 Base) MCG/ACT inhaler INHALE 2 PUFFS INTO THE LUNGS EVERY 6 HOURS AS NEEDED    . amphetamine-dextroamphetamine (ADDERALL) 10 MG tablet Take 1 tablet (10 mg total) by mouth 3 (three) times daily. 90 tablet 0  . ANORO ELLIPTA 62.5-25 MCG/INH AEPB INHALE 1 PUFF INTO THE LUNGS DAILY 60 each 2  . atorvastatin (LIPITOR) 40 MG tablet Take 1 tablet (40 mg total) by mouth daily. 90 tablet 3  . buPROPion (WELLBUTRIN XL) 300 MG 24 hr tablet Take 1 tablet (300 mg total) by mouth daily. 30 tablet 0  . HYDROcodone-acetaminophen (NORCO/VICODIN) 5-325 MG tablet Take 1 tablet by mouth at bedtime. FOR CHRONIC LEG PAIN 30 tablet 0  . metFORMIN (GLUCOPHAGE) 500 MG tablet Take 1 tablet (500 mg total) by mouth 2 (two) times daily with a meal. 180 tablet 0  . metolazone (ZAROXOLYN) 2.5 MG tablet Take 2 tablets (5 mg total) by mouth as directed. Take 5 mg on Monday and Thursday AS NEEDED ONLY for weight gain of 3 lbs or edema. 20 tablet 0  . montelukast (SINGULAIR) 10 MG tablet Take 10 mg by mouth daily.    . potassium chloride (K-DUR) 10 MEQ tablet Take 1 tablet (10 mEq total) by mouth once for 1 dose. 30 tablet 0  . predniSONE (DELTASONE) 5 MG tablet Take 1 tablet (5 mg total) by mouth daily with breakfast. 30 tablet 2  . torsemide (DEMADEX) 20 MG tablet Take 3 tablets (60 mg total) by mouth 2 (two) times daily. 60 tablet 0   No current facility-administered  medications for this visit.      Psychiatric Specialty Exam: Review of Systems  Constitutional: Positive for malaise/fatigue.  Psychiatric/Behavioral: Positive for depression. The patient is nervous/anxious.   All other systems reviewed and are negative.   There were no vitals taken for this visit.There is no height or weight on file to calculate BMI.  General Appearance: NA  Eye Contact:  NA  Speech:  Clear and Coherent and Normal Rate  Volume:  Normal  Mood:  Anxious and Depressed  Affect:  NA  Thought Process:  Goal Directed  Orientation:  Full (Time, Place, and Person)  Thought Content: Logical   Suicidal Thoughts:  No  Homicidal Thoughts:  No  Memory:  Immediate;   Good Recent;   Good Remote;   Good  Judgement:  Fair  Insight:  Fair  Psychomotor Activity:  NA  Concentration:  Concentration: Good  Recall:  Good  Fund of Knowledge: Good  Language: Good  Akathisia:  NA  Handed:  Right  AIMS (if indicated): not done  Assets:  Communication Skills Desire for Improvement Financial Resources/Insurance Housing Social Support Vocational/Educational  ADL's:  Intact  Cognition: WNL  Sleep:  Good   Screenings: GAD-7     Office Visit from 09/04/2018 in [facility]  Total GAD-7 Score  7    PHQ2-9     Office Visit from 09/04/2018 in [facility]  PHQ-2 Total Score  6  PHQ-9 Total Score  14       Assessment and Plan: 63 yo married female with long standing problems with anxiety and depression which worsened 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 She also has a hx of ADD. Her PCP wanted her to be followed by mental health and refused to prescribe Adderall which patient has been on for some time. No SI now or ever. No hx of mania,psychosis, no alcohol/drug abuse. She takes Lexapro 20 mg daily, Adderall 20 mg in AM and 10 mg  at noon. Addition of Abilify (2 mg) to augment Lexapro did not help and she noticed increased appetite even after she stopped it. In the oast she tried several SSRIs/SNRIs as well as Trintellix with questionable benefit. She was also on bupropion but could not recall if it was helpful and given her chronic fatigue we decided to try it again. She started on Wellbutrin XL 150 mg and tolerates it well. No clear improvement in mood or fatigue noticed.Marland Kitchen  Dx: Dysthymic disorder; ADD adult type  Plan: Increase Wellbutrin XL to 300 mg and continue Adderall unchanged.Next visit in 4 weeks. The plan was discussed with patient who had an opportunity to ask questions and these were all answered. I spend 25 minutes in phone consultation with the patient.     Stephanie Acre, MD 11/14/2018, 9:08 AM

## 2018-11-16 ENCOUNTER — Encounter: Payer: 59 | Admitting: Physical Therapy

## 2018-11-17 ENCOUNTER — Other Ambulatory Visit: Payer: Self-pay | Admitting: Family Medicine

## 2018-11-17 DIAGNOSIS — G2581 Restless legs syndrome: Secondary | ICD-10-CM

## 2018-11-17 NOTE — Telephone Encounter (Signed)
Rx request  Last fill 11/06/18 #30/0 Last OV 11/06/18

## 2018-11-20 NOTE — Telephone Encounter (Signed)
Requesting refill on Hydrocodone 5-325 mg, directions are one tablet at bedtime. Last OV 10/09/2018, Last Rx 11/08/2018, # 30, refill 0. Pt is scheduled to see you on 01/08/2019.

## 2018-11-20 NOTE — Telephone Encounter (Signed)
There has been an issue with getting the patient a 30 day fill at the pharmacy. Please check database to see if she is due.

## 2018-11-28 ENCOUNTER — Other Ambulatory Visit: Payer: Self-pay | Admitting: Family Medicine

## 2018-11-28 DIAGNOSIS — G2581 Restless legs syndrome: Secondary | ICD-10-CM

## 2018-11-28 NOTE — Telephone Encounter (Signed)
Refill request received again today. Please check controlled substance database and advise Dr. Juleen China of date of last refill please. I do not have access.

## 2018-11-28 NOTE — Telephone Encounter (Signed)
Last fill 11/08/2018  #30/0

## 2018-11-29 NOTE — Telephone Encounter (Signed)
See note

## 2018-11-29 NOTE — Telephone Encounter (Signed)
Left a message for pt to call office back.  °

## 2018-11-29 NOTE — Telephone Encounter (Signed)
Patient returned Brendell's call can be reached at Ph# [phone]

## 2018-11-29 NOTE — Telephone Encounter (Signed)
Call patient to see if she requested the refill.

## 2018-11-30 NOTE — Telephone Encounter (Signed)
I spoke with patient and she informed me that she has only been getting #7 tablets at a time from pharmacy.  Patient has been out of medication for 3 days now.  Patient explains that she does not understand why the pharmacy has only been giving her 7.  Last fill was 11/08/18 #30/0. Please advise.

## 2018-12-02 MED ORDER — HYDROCODONE-ACETAMINOPHEN 5-325 MG PO TABS: 1.0000 | ORAL_TABLET | Freq: Every day | ORAL | 0 refills | Status: DC

## 2018-12-02 NOTE — Telephone Encounter (Signed)
Rx sent. Please make priority to figure out how to fix this issue.

## 2018-12-07 NOTE — Telephone Encounter (Signed)
I spoke with patient and she informed me that she is now getting 30 days worth of the Rx.

## 2018-12-10 ENCOUNTER — Other Ambulatory Visit: Payer: Self-pay | Admitting: Family Medicine

## 2018-12-11 NOTE — Telephone Encounter (Signed)
See note

## 2018-12-11 NOTE — Telephone Encounter (Signed)
°  Relation to pt: self  Call back number: [phone]  Pharmacy: [facility], [address] AT [facility] [address] [place] [phone] (Phone) [phone] (Fax)     Reason for call:  Patient go out of town on Wednesday, checking on the status of ANORO ELLIPTA 62.5-25 MCG/INH AEPB request, informed patient please allow 48 hour turn around, please advise

## 2018-12-14 ENCOUNTER — Ambulatory Visit (HOSPITAL_COMMUNITY): Payer: 59 | Admitting: Psychiatry

## 2018-12-25 ENCOUNTER — Other Ambulatory Visit: Payer: Self-pay | Admitting: Family Medicine

## 2018-12-25 DIAGNOSIS — I50812 Chronic right heart failure: Secondary | ICD-10-CM

## 2018-12-26 ENCOUNTER — Other Ambulatory Visit: Payer: Self-pay | Admitting: Family Medicine

## 2018-12-26 DIAGNOSIS — E1159 Type 2 diabetes mellitus with other circulatory complications: Secondary | ICD-10-CM

## 2019-01-03 ENCOUNTER — Other Ambulatory Visit: Payer: Self-pay

## 2019-01-03 DIAGNOSIS — G2581 Restless legs syndrome: Secondary | ICD-10-CM

## 2019-01-03 MED ORDER — HYDROCODONE-ACETAMINOPHEN 5-325 MG PO TABS: 1.0000 | ORAL_TABLET | Freq: Every day | ORAL | 0 refills | Status: DC

## 2019-01-03 NOTE — Telephone Encounter (Signed)
Called patient to make sure that she did not have issues with getting filled. She has been able to get full 30 days. I have pulled up and patient is aware we will send in refill  that

## 2019-01-03 NOTE — Telephone Encounter (Signed)
  LAST APPOINTMENT DATE: 11/06/18  NEXT APPOINTMENT DATE: 01/08/2019  MEDICATION: HYDROcodone-acetaminophen (NORCO/VICODIN) 5-325 MG tablet  PHARMACY: [id number] - [place], [place] - [address] AT [facility] OF [address] [phone] (Phone) [phone] (Fax)    Let patient know to contact pharmacy at the end of the day to make sure medication is ready.    Please notify patient to allow 48-72 hours to process  Encourage patient to contact the pharmacy for refills or they can request refills through [place]:   LAST REFILL:   QTY:   REFILL DATE:     OTHER COMMENTS:    Okay for refill?   Please advise

## 2019-01-03 NOTE — Addendum Note (Signed)
Addended by: Francella Solian on: 01/03/2019 01:45 PM   Modules accepted: Orders

## 2019-01-03 NOTE — Telephone Encounter (Signed)
  Patient has follow up on 01/08/19 ok to refill?

## 2019-01-03 NOTE — Telephone Encounter (Signed)
Yes. Still issue with insurance paying for full month?

## 2019-01-08 ENCOUNTER — Ambulatory Visit (INDEPENDENT_AMBULATORY_CARE_PROVIDER_SITE_OTHER): Payer: Self-pay | Admitting: Family Medicine

## 2019-01-08 ENCOUNTER — Other Ambulatory Visit: Payer: Self-pay

## 2019-01-08 ENCOUNTER — Encounter: Payer: Self-pay | Admitting: Family Medicine

## 2019-01-08 VITALS — Ht 67.0 in | Wt 229.0 lb

## 2019-01-08 DIAGNOSIS — I50812 Chronic right heart failure: Secondary | ICD-10-CM

## 2019-01-08 DIAGNOSIS — F341 Dysthymic disorder: Secondary | ICD-10-CM

## 2019-01-08 DIAGNOSIS — G4733 Obstructive sleep apnea (adult) (pediatric): Secondary | ICD-10-CM

## 2019-01-08 DIAGNOSIS — M791 Myalgia, unspecified site: Secondary | ICD-10-CM

## 2019-01-08 DIAGNOSIS — E1169 Type 2 diabetes mellitus with other specified complication: Secondary | ICD-10-CM

## 2019-01-08 DIAGNOSIS — J329 Chronic sinusitis, unspecified: Secondary | ICD-10-CM

## 2019-01-08 DIAGNOSIS — I2721 Secondary pulmonary arterial hypertension: Secondary | ICD-10-CM

## 2019-01-08 DIAGNOSIS — J432 Centrilobular emphysema: Secondary | ICD-10-CM

## 2019-01-08 DIAGNOSIS — B9689 Other specified bacterial agents as the cause of diseases classified elsewhere: Secondary | ICD-10-CM

## 2019-01-08 DIAGNOSIS — R5381 Other malaise: Secondary | ICD-10-CM

## 2019-01-08 DIAGNOSIS — F900 Attention-deficit hyperactivity disorder, predominantly inattentive type: Secondary | ICD-10-CM

## 2019-01-08 DIAGNOSIS — R5383 Other fatigue: Secondary | ICD-10-CM

## 2019-01-08 DIAGNOSIS — E785 Hyperlipidemia, unspecified: Secondary | ICD-10-CM

## 2019-01-08 DIAGNOSIS — R0602 Shortness of breath: Secondary | ICD-10-CM

## 2019-01-08 DIAGNOSIS — J9611 Chronic respiratory failure with hypoxia: Secondary | ICD-10-CM

## 2019-01-08 DIAGNOSIS — G2581 Restless legs syndrome: Secondary | ICD-10-CM

## 2019-01-08 DIAGNOSIS — E1159 Type 2 diabetes mellitus with other circulatory complications: Secondary | ICD-10-CM

## 2019-01-08 MED ORDER — AZITHROMYCIN 250 MG PO TABS: ORAL_TABLET | ORAL | 0 refills | Status: DC

## 2019-01-08 NOTE — Progress Notes (Signed)
Virtual Visit via Video   Due to the COVID-19 pandemic, this visit was completed with telemedicine (audio/video) technology to reduce patient and provider exposure as well as to preserve personal protective equipment.   I connected with [name] by a video enabled telemedicine application and verified that I am speaking with the correct person using two identifiers. Location patient: Home Location provider: [facility], Office Persons participating in the virtual visit: Seidy P Mellina, [place], DO   I discussed the limitations of evaluation and management by telemedicine and the availability of in person appointments. The patient expressed understanding and agreed to proceed.  Care Team   Patient Care Team: Briscoe Deutscher, DO as PCP - General (Family Medicine)  Subjective:   HPI: Followed by psychiatry.  Medication adjustment recently, increasing Wellbutrin to 300 mg.  She cannot tell much of a difference yet.  Endorses increased fatigue.  Had a previous visit, I restarted her physiologic dose of prednisone at 2.5 mg daily.  She did have improvement in symptoms for quite some time but they are starting to decline again.  History of CHF, pulmonary hypertension, emphysema, sleep apnea.  She was discharged from the hospital several months ago with 5 L nasal cannula to be used at night.  She is using 2 L currently.  Waking with headaches daily.  Was told that his CPAP would be helpful but she was afraid she could not afford this.  Self checked pulse ox at night before bed is 94%.  When walking goes down to 91-92% consistently.  She is feeling some overall head and chest congestion.  She feels bilateral maxillary sinus pressure.  This is been worsening over the past week.  She has noticed some feelings of imbalance associated with it.  No COVID exposure.  Bilateral lower extremity edema.  Controlled with current diuretics.  Taking the extra potassium.  She does have a  pulmonologist but has not seen them in a while.  No follow-up is scheduled.  Working on disability.  She may be getting an attorney to help with this. Review of Systems  Constitutional: Negative for chills and fever.  HENT: Negative for hearing loss and tinnitus.   Eyes: Negative for blurred vision and double vision.  Respiratory: Negative for cough and wheezing.   Cardiovascular: Positive for leg swelling. Negative for chest pain and palpitations.       Swelling in feet Lt>RT has history of CHF   Gastrointestinal: Negative for nausea and vomiting.  Genitourinary: Negative for dysuria and urgency.  Musculoskeletal: Negative for myalgias and neck pain.  Psychiatric/Behavioral: Positive for depression. Negative for suicidal ideas.     Patient Active Problem List   Diagnosis Date Noted  . Dysthymic disorder 10/10/2018  . Polycythemia 09/15/2018  . Vitamin D deficiency 09/09/2018  . Adult ADHD 07/03/2018  . GAD (generalized anxiety disorder) 07/03/2018  . Mild episode of recurrent major depressive disorder ([place]) 07/03/2018  . Centrilobular emphysema ([place]) 06/02/2018  . OSA (obstructive sleep apnea) 06/02/2018  . Pulmonary nodule 05/01/2018  . Pulmonary hypertension ([place])   . Morbid obesity ([place])   . Chronic respiratory failure with hypoxia ([place]) 04/22/2018  . Chronic right-sided CHF (congestive heart failure) ([place]) 04/22/2018  . PAH (pulmonary artery hypertension) ([place]) 04/22/2018  . Benzodiazepine dependence, continuous ([place]) 02/22/2018  . Serrated adenoma of colon 08/01/2017  . Non-seasonal allergic rhinitis due to pollen 08/16/2016  . Type 2 diabetes mellitus with circulatory disorder, without long-term current use of insulin ([place]), Rx  Metformin 07/19/2016  . Hyperlipidemia associated with type 2 diabetes mellitus ([place]) 07/19/2016  . ADHD (attention deficit hyperactivity disorder) 06/29/2012  . Major depression, recurrent, full remission ([place]) 09/20/2011  . Restless leg syndrome  01/07/2011  . Mild persistent asthma 01/07/2011  . Hiatal hernia 07/28/2006  . Collagenous colitis 07/28/2006    Social History   Tobacco Use  . Smoking status: Former Smoker    Packs/day: 0.50    Types: Cigarettes  . Smokeless tobacco: Never Used  Substance Use Topics  . Alcohol use: No    Current Outpatient Medications:  .  albuterol (PROVENTIL HFA;VENTOLIN HFA) 108 (90 Base) MCG/ACT inhaler, INHALE 2 PUFFS INTO THE LUNGS EVERY 6 HOURS AS NEEDED, Disp: , Rfl:  .  amphetamine-dextroamphetamine (ADDERALL) 10 MG tablet, Take 1 tablet (10 mg total) by mouth 3 (three) times daily., Disp: 90 tablet, Rfl: 0 .  ANORO ELLIPTA 62.5-25 MCG/INH AEPB, INHALE 1 PUFF INTO THE LUNGS DAILY, Disp: 60 each, Rfl: 2 .  atorvastatin (LIPITOR) 40 MG tablet, Take 1 tablet (40 mg total) by mouth daily., Disp: 90 tablet, Rfl: 3 .  buPROPion (WELLBUTRIN XL) 300 MG 24 hr tablet, Take 1 tablet (300 mg total) by mouth daily., Disp: 30 tablet, Rfl: 0 .  HYDROcodone-acetaminophen (NORCO/VICODIN) 5-325 MG tablet, Take 1 tablet by mouth at bedtime. FOR CHRONIC LEG PAIN, Disp: 30 tablet, Rfl: 0 .  metFORMIN (GLUCOPHAGE) 500 MG tablet, TAKE 1 TABLET(500 MG) BY MOUTH TWICE DAILY WITH A MEAL, Disp: 180 tablet, Rfl: 0 .  metolazone (ZAROXOLYN) 2.5 MG tablet, Take 2 tablets (5 mg total) by mouth as directed. Take 5 mg on Monday and Thursday AS NEEDED ONLY for weight gain of 3 lbs or edema., Disp: 20 tablet, Rfl: 0 .  montelukast (SINGULAIR) 10 MG tablet, Take 10 mg by mouth daily., Disp: , Rfl:  .  potassium chloride (K-DUR) 10 MEQ tablet, Take 1 tablet (10 mEq total) by mouth once for 1 dose., Disp: 30 tablet, Rfl: 0 .  predniSONE (DELTASONE) 5 MG tablet, Take 1 tablet (5 mg total) by mouth daily with breakfast., Disp: 30 tablet, Rfl: 2 .  torsemide (DEMADEX) 20 MG tablet, Take 3 tablets (60 mg total) by mouth 2 (two) times daily., Disp: 60 tablet, Rfl: 0 .  azithromycin (ZITHROMAX) 250 MG tablet, 2 po on day one, then one  po daily until gone., Disp: 6 tablet, Rfl: 0 .  escitalopram (LEXAPRO) 20 MG tablet, , Disp: , Rfl:  .  potassium chloride (K-DUR) 10 MEQ tablet, TK 1 T PO QD FOR 1 DOSE, Disp: , Rfl:   Allergies  Allergen Reactions  . Asa [Aspirin] Nausea Only    abd pain  . Codeine Nausea Only  . Sulfa Antibiotics Hives    Objective:   VITALS: Per patient if applicable, see vitals. GENERAL: Alert, appears well and in no acute distress. HEENT: Atraumatic, conjunctiva clear, no obvious abnormalities on inspection of external nose and ears. NECK: Normal movements of the head and neck. CARDIOPULMONARY: No increased WOB. Speaking in clear sentences. I:E ratio WNL.  MS: Moves all visible extremities without noticeable abnormality. PSYCH: Pleasant and cooperative, well-groomed. Speech normal rate and rhythm. Affect is appropriate. Insight and judgement are appropriate. Attention is focused, linear, and appropriate.  NEURO: CN grossly intact. Oriented as arrived to appointment on time with no prompting. Moves both UE equally.  SKIN: No obvious lesions, wounds, erythema, or cyanosis noted on face or hands.  Depression screen [facility] 2/9 01/08/2019 09/04/2018  Decreased Interest 3 3  Down, Depressed, Hopeless 3 3  PHQ - 2 Score 6 6  Altered sleeping 3 3  Tired, decreased energy 3 3  Change in appetite 0 0  Feeling bad or failure about yourself  0 2  Trouble concentrating 0 0  Moving slowly or fidgety/restless 0 0  Suicidal thoughts 0 0  PHQ-9 Score 12 14  Difficult doing work/chores Very difficult Somewhat difficult    Assessment and Plan:   Danalee was seen today for follow-up.  Diagnoses and all orders for this visit:  Dysthymic disorder Comments: Followed by Psychiatry.  OSA (obstructive sleep apnea) Comments: Not using CPAP. Worried about expense. Using [facility] at night since hospital stay. Waking with HA.  Type 2 diabetes mellitus with other circulatory complication, without long-term current  use of insulin (HCC) -     Hemoglobin A1c  Hyperlipidemia associated with type 2 diabetes mellitus (HCC) -     Lipid panel  Attention deficit hyperactivity disorder (ADHD), predominantly inattentive type  Morbid obesity ([place])  Chronic right-sided CHF (congestive heart failure) ([place]) Comments: Edema. Taking diuretic daily. Labs due. Orders: -     CBC with Differential/Platelet -     Comprehensive metabolic panel -     Magnesium  Chronic respiratory failure with hypoxia (HCC)  PAH (pulmonary artery hypertension) (HCC)  Centrilobular emphysema (HCC)  Restless leg syndrome Comments: Stable with Norco q hs.  SOB (shortness of breath) on exertion Comments: Worsening. Noted deconditioning.   Malaise and fatigue Comments: Multifactorial.  Bacterial sinusitis -     azithromycin (ZITHROMAX) 250 MG tablet; 2 po on day one, then one po daily until gone.  Myalgia -     CK   . COVID-19 Education: The signs and symptoms of COVID-19 were discussed with the patient and how to seek care for testing if needed. The importance of social distancing was discussed today. . Reviewed expectations re: course of current medical issues. . Discussed self-management of symptoms. . Outlined signs and symptoms indicating need for more acute intervention. . Patient verbalized understanding and all questions were answered. Marland Kitchen Health Maintenance issues including appropriate healthy diet, exercise, and smoking avoidance were discussed with patient. . See orders for this visit as documented in the electronic medical record.  Briscoe Deutscher, DO  Records requested if needed. Time spent: 25 minutes, of which >50% was spent in obtaining information about her symptoms, reviewing her previous labs, evaluations, and treatments, counseling her about her condition (please see the discussed topics above), and developing a plan to further investigate it; she had a number of questions which I addressed.

## 2019-02-02 ENCOUNTER — Telehealth: Payer: Self-pay | Admitting: Family Medicine

## 2019-02-02 DIAGNOSIS — G2581 Restless legs syndrome: Secondary | ICD-10-CM

## 2019-02-02 NOTE — Telephone Encounter (Signed)
Requested medication (s) are due for refill today: yes  Requested medication (s) are on the active medication list: yes  Last refill:  01/03/2019  Future visit scheduled: no  Notes to clinic:  Refill cannot be delegated    Requested Prescriptions  Pending Prescriptions Disp Refills   HYDROcodone-acetaminophen (NORCO/VICODIN) 5-325 MG tablet 30 tablet 0    Sig: Take 1 tablet by mouth at bedtime. FOR CHRONIC LEG PAIN     Not Delegated - Analgesics:  Opioid Agonist Combinations Failed - 02/02/2019 12:49 PM      Failed - This refill cannot be delegated      Failed - Urine Drug Screen completed in last 360 days.      Passed - Valid encounter within last 6 months    Recent Outpatient Visits          3 weeks ago Dysthymic disorder   [place] [facility], [place], DO   2 months ago Centrilobular emphysema [facility])   [place] Wallace, [place], DO   3 months ago Type 2 diabetes mellitus with other circulatory complication, without long-term current use of insulin [facility])   [place] Wallace, [place], [place]   4 months ago [place] Wallace, [place], DO   5 months ago Chronic respiratory failure with hypoxia [facility])   [place] Wallace, [place], [place]

## 2019-02-02 NOTE — Telephone Encounter (Signed)
Medication: HYDROcodone-acetaminophen (NORCO/VICODIN) 5-325 MG tablet   Patient is requesting a refill.     Pharmacy:  [facility] [place], [address] AT [facility] [address] [facility] [address] [phone] (Phone) [phone] (Fax)

## 2019-02-05 MED ORDER — HYDROCODONE-ACETAMINOPHEN 5-325 MG PO TABS: 1.0000 | ORAL_TABLET | Freq: Every day | ORAL | 0 refills | Status: DC

## 2019-02-05 NOTE — Telephone Encounter (Signed)
Last OV 01/08/19 Last fill 01/03/19  #30/0

## 2019-02-05 NOTE — Telephone Encounter (Signed)
See request °

## 2019-03-05 ENCOUNTER — Telehealth (HOSPITAL_COMMUNITY): Payer: Self-pay

## 2019-03-05 NOTE — Telephone Encounter (Signed)
Patient is calling for a refill on her meds, she has not been in since August, but is waiting on new insurance. Please review and advise, thank you

## 2019-03-06 ENCOUNTER — Other Ambulatory Visit (HOSPITAL_COMMUNITY): Payer: Self-pay | Admitting: Psychiatry

## 2019-03-06 MED ORDER — BUPROPION HCL ER (XL) 300 MG PO TB24: 300.0000 mg | ORAL_TABLET | Freq: Every day | ORAL | 2 refills | Status: DC

## 2019-03-06 NOTE — Telephone Encounter (Signed)
I reordered Wellbutrin but cannot do this for Adderall (problems with my remote ID app).

## 2019-03-30 ENCOUNTER — Telehealth: Payer: Self-pay | Admitting: Family Medicine

## 2019-03-30 NOTE — Telephone Encounter (Signed)
See note  Copied from [place] [phone]. Topic: General - Other >> Mar 30, 2019  1:30 PM Nils Flack, Marland Kitchen wrote: Reason for CRM:  form is being faxed from for [place] (inhaler) from the company for assistance in paying for the meds.  Please fill out and send back as soon as possible.  Pt is without meds right now.  Pt will call to make to make transfer of care appt when she gets her new ins card. Please call let pt know ppw is received.  Cb is [phone]

## 2019-05-03 ENCOUNTER — Other Ambulatory Visit: Payer: Self-pay

## 2019-05-03 ENCOUNTER — Telehealth: Payer: Self-pay

## 2019-05-03 DIAGNOSIS — E1159 Type 2 diabetes mellitus with other circulatory complications: Secondary | ICD-10-CM

## 2019-05-03 MED ORDER — METFORMIN HCL 500 MG PO TABS: ORAL_TABLET | ORAL | 0 refills | Status: DC

## 2019-05-03 NOTE — Telephone Encounter (Signed)
Please call patient we have received refill request on Metformin. We have called in 30 day supply for her but she needs to make appointment for more refills with new provider.

## 2019-05-03 NOTE — Telephone Encounter (Signed)
Notified pt of refill and scheduled a TOC appt with Dr.Wolfe in March.

## 2019-05-28 ENCOUNTER — Other Ambulatory Visit (HOSPITAL_COMMUNITY): Payer: Self-pay | Admitting: *Deleted

## 2019-05-28 ENCOUNTER — Telehealth (HOSPITAL_COMMUNITY): Payer: Self-pay | Admitting: *Deleted

## 2019-05-28 ENCOUNTER — Other Ambulatory Visit (HOSPITAL_COMMUNITY): Payer: Self-pay | Admitting: Psychiatry

## 2019-05-28 MED ORDER — ALPRAZOLAM 0.5 MG PO TABS: 0.5000 mg | ORAL_TABLET | Freq: Two times a day (BID) | ORAL | 0 refills | Status: AC | PRN

## 2019-05-28 MED ORDER — BUPROPION HCL ER (XL) 300 MG PO TB24: 300.0000 mg | ORAL_TABLET | Freq: Every day | ORAL | 0 refills | Status: DC
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
## 2019-05-28 NOTE — Telephone Encounter (Signed)
Sounds perfect

## 2019-05-28 NOTE — Telephone Encounter (Signed)
Received refill request for Wellbutrin XL 300 mg qd. Pt was last seen on 11/14/18 with no upcoming appointments. Will do 30 days with a call to schedule, or leave VM, reminding pt to call for appointment.

## 2019-05-28 NOTE — Telephone Encounter (Signed)
I did send requested Rx to her pharmacy.

## 2019-06-15 ENCOUNTER — Other Ambulatory Visit: Payer: Self-pay | Admitting: Family Medicine

## 2019-06-15 MED ORDER — ANORO ELLIPTA 62.5-25 MCG/INH IN AEPB: INHALATION_SPRAY | RESPIRATORY_TRACT | 3 refills | Status: DC

## 2019-06-15 MED ORDER — ANORO ELLIPTA 62.5-25 MCG/INH IN AEPB: INHALATION_SPRAY | RESPIRATORY_TRACT | 2 refills | Status: DC

## 2019-06-19 ENCOUNTER — Telehealth: Payer: Self-pay | Admitting: Family Medicine

## 2019-06-19 MED ORDER — MONTELUKAST SODIUM 10 MG PO TABS: 10.0000 mg | ORAL_TABLET | Freq: Every day | ORAL | 0 refills | Status: DC

## 2019-06-19 NOTE — Telephone Encounter (Signed)
  LAST APPOINTMENT DATE: 05/03/2019   NEXT APPOINTMENT DATE:@3 /04/2020-TOC  MEDICATION: montelukast (SINGULAIR) 10 MG tablet  [id number] [place] [place] [address]  **Let patient know to contact pharmacy at the end of the day to make sure medication is ready. **  ** Please notify patient to allow 48-72 hours to process**  **Encourage patient to contact the pharmacy for refills or they can request refills through [facility]**  CLINICAL FILLS OUT ALL BELOW:   LAST REFILL:  QTY:  REFILL DATE:    OTHER COMMENTS:    Okay for refill?  Please advise

## 2019-06-25 ENCOUNTER — Ambulatory Visit (HOSPITAL_COMMUNITY): Payer: 59 | Admitting: Psychiatry

## 2019-06-28 ENCOUNTER — Ambulatory Visit (INDEPENDENT_AMBULATORY_CARE_PROVIDER_SITE_OTHER): Payer: Self-pay | Admitting: Psychiatry

## 2019-06-28 ENCOUNTER — Other Ambulatory Visit: Payer: Self-pay

## 2019-06-28 DIAGNOSIS — F900 Attention-deficit hyperactivity disorder, predominantly inattentive type: Secondary | ICD-10-CM

## 2019-06-28 DIAGNOSIS — F330 Major depressive disorder, recurrent, mild: Secondary | ICD-10-CM

## 2019-06-28 DIAGNOSIS — F341 Dysthymic disorder: Secondary | ICD-10-CM

## 2019-06-28 MED ORDER — ALPRAZOLAM 0.5 MG PO TABS: 0.5000 mg | ORAL_TABLET | Freq: Three times a day (TID) | ORAL | 2 refills | Status: DC | PRN

## 2019-06-28 MED ORDER — BUPROPION HCL ER (XL) 300 MG PO TB24: 300.0000 mg | ORAL_TABLET | Freq: Every day | ORAL | 2 refills | Status: DC
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
## 2019-07-13 ENCOUNTER — Other Ambulatory Visit: Payer: Self-pay | Admitting: Family Medicine

## 2019-07-20 ENCOUNTER — Encounter: Payer: Self-pay | Admitting: Family Medicine

## 2019-07-20 NOTE — Progress Notes (Deleted)
Patient: [name] MRN: [id number] DOB: 05-24-1955 PCP: Briscoe Deutscher, DO     Subjective:  No chief complaint on file.   HPI: The patient is a 63 y.o. female who presents today for ***  Review of Systems  Allergies Patient is allergic to asa [aspirin]; codeine; and sulfa antibiotics.  Past Medical History Patient  has a past medical history of ADHD, Chronic right-sided CHF (congestive heart failure) ([place]), Depression, GERD (gastroesophageal reflux disease), Migraine, PAH (pulmonary artery hypertension) ([place]), and Restless leg syndrome.  Surgical History Patient  has a past surgical history that includes Cholecystectomy; Tonsillectomy; RIGHT HEART CATH (N/A, 04/25/2018); and Cesarean section.  Family History Pateint's family history includes Alcohol abuse in her sister; Drug abuse in her sister; Early death in her sister; Emphysema in her father; Mental illness in her sister; Pancreatic cancer in her mother.  Social History Patient  reports that she has quit smoking. Her smoking use included cigarettes. She smoked 0.50 packs per day. She has never used smokeless tobacco. She reports that she does not drink alcohol or use drugs.    Objective: There were no vitals filed for this visit.  There is no height or weight on file to calculate BMI.  Physical Exam     Assessment/plan:      No follow-ups on file.     @[facility] @ 07/20/2019

## 2019-07-27 ENCOUNTER — Other Ambulatory Visit: Payer: Self-pay | Admitting: Family Medicine

## 2019-07-27 DIAGNOSIS — E1159 Type 2 diabetes mellitus with other circulatory complications: Secondary | ICD-10-CM

## 2019-08-09 ENCOUNTER — Encounter: Payer: Self-pay | Admitting: Family Medicine

## 2019-09-09 ENCOUNTER — Other Ambulatory Visit (HOSPITAL_COMMUNITY): Payer: Self-pay | Admitting: Psychiatry

## 2019-09-17 ENCOUNTER — Telehealth (INDEPENDENT_AMBULATORY_CARE_PROVIDER_SITE_OTHER): Payer: Self-pay | Admitting: Psychiatry

## 2019-09-17 ENCOUNTER — Other Ambulatory Visit: Payer: Self-pay

## 2019-09-17 DIAGNOSIS — F341 Dysthymic disorder: Secondary | ICD-10-CM

## 2019-09-17 DIAGNOSIS — F411 Generalized anxiety disorder: Secondary | ICD-10-CM

## 2019-09-17 DIAGNOSIS — F909 Attention-deficit hyperactivity disorder, unspecified type: Secondary | ICD-10-CM

## 2019-09-17 MED ORDER — ALPRAZOLAM 0.5 MG PO TABS: 0.5000 mg | ORAL_TABLET | Freq: Three times a day (TID) | ORAL | 2 refills | Status: DC | PRN
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
## 2019-09-27 ENCOUNTER — Encounter: Payer: Self-pay | Admitting: Family Medicine

## 2019-09-27 ENCOUNTER — Other Ambulatory Visit: Payer: Self-pay

## 2019-09-27 ENCOUNTER — Ambulatory Visit (INDEPENDENT_AMBULATORY_CARE_PROVIDER_SITE_OTHER): Payer: No Typology Code available for payment source | Admitting: Family Medicine

## 2019-09-27 ENCOUNTER — Ambulatory Visit (INDEPENDENT_AMBULATORY_CARE_PROVIDER_SITE_OTHER): Payer: No Typology Code available for payment source

## 2019-09-27 VITALS — BP 130/72 | HR 105 | Temp 98.2°F | Ht 67.0 in

## 2019-09-27 DIAGNOSIS — M79642 Pain in left hand: Secondary | ICD-10-CM

## 2019-09-27 DIAGNOSIS — E1159 Type 2 diabetes mellitus with other circulatory complications: Secondary | ICD-10-CM

## 2019-09-27 DIAGNOSIS — Z599 Problem related to housing and economic circumstances, unspecified: Secondary | ICD-10-CM

## 2019-09-27 DIAGNOSIS — M79641 Pain in right hand: Secondary | ICD-10-CM | POA: Diagnosis not present

## 2019-09-27 DIAGNOSIS — E785 Hyperlipidemia, unspecified: Secondary | ICD-10-CM | POA: Diagnosis not present

## 2019-09-27 DIAGNOSIS — E1169 Type 2 diabetes mellitus with other specified complication: Secondary | ICD-10-CM

## 2019-09-27 DIAGNOSIS — Z1159 Encounter for screening for other viral diseases: Secondary | ICD-10-CM

## 2019-09-27 DIAGNOSIS — Z598 Other problems related to housing and economic circumstances: Secondary | ICD-10-CM

## 2019-09-27 DIAGNOSIS — D751 Secondary polycythemia: Secondary | ICD-10-CM

## 2019-09-27 DIAGNOSIS — I2721 Secondary pulmonary arterial hypertension: Secondary | ICD-10-CM

## 2019-09-27 DIAGNOSIS — E559 Vitamin D deficiency, unspecified: Secondary | ICD-10-CM | POA: Diagnosis not present

## 2019-09-27 DIAGNOSIS — I50812 Chronic right heart failure: Secondary | ICD-10-CM

## 2019-09-27 DIAGNOSIS — M720 Palmar fascial fibromatosis [Dupuytren]: Secondary | ICD-10-CM

## 2019-09-27 LAB — COMPREHENSIVE METABOLIC PANEL
ALT: 19 U/L (ref 0–35)
AST: 18 U/L (ref 0–37)
Albumin: 4.7 g/dL (ref 3.5–5.2)
Alkaline Phosphatase: 90 U/L (ref 39–117)
BUN: 37 mg/dL — ABNORMAL HIGH (ref 6–23)
CO2: 41 mEq/L — ABNORMAL HIGH (ref 19–32)
Calcium: 10.5 mg/dL (ref 8.4–10.5)
Chloride: 84 mEq/L — ABNORMAL LOW (ref 96–112)
Creatinine, Ser: 0.89 mg/dL (ref 0.40–1.20)
GFR: 64.08 mL/min (ref >60.00)
Glucose, Bld: 140 mg/dL — ABNORMAL HIGH (ref 70–99)
Potassium: 2.9 mEq/L — ABNORMAL LOW (ref 3.5–5.1)
Sodium: 139 mEq/L (ref 135–145)
Total Bilirubin: 0.5 mg/dL (ref 0.2–1.2)
Total Protein: 7.3 g/dL (ref 6.0–8.3)

## 2019-09-27 LAB — VITAMIN D 25 HYDROXY (VIT D DEFICIENCY, FRACTURES): VITD: 30.08 ng/mL (ref 30.00–100.00)

## 2019-09-27 LAB — LIPID PANEL
Cholesterol: 207 mg/dL — ABNORMAL HIGH (ref 0–200)
HDL: 42.5 mg/dL (ref >39.00)
LDL Cholesterol: 134 mg/dL — ABNORMAL HIGH (ref 0–99)
NonHDL: 164.43
Total CHOL/HDL Ratio: 5
Triglycerides: 152 mg/dL — ABNORMAL HIGH (ref 0.0–149.0)
VLDL: 30.4 mg/dL (ref 0.0–40.0)

## 2019-09-27 LAB — TSH: TSH: 1.57 u[IU]/mL (ref 0.35–4.50)

## 2019-09-27 LAB — HEMOGLOBIN A1C: Hgb A1c MFr Bld: 6.7 % — ABNORMAL HIGH (ref 4.6–6.5)

## 2019-09-27 MED ORDER — TRAMADOL HCL 50 MG PO TABS: 50.0000 mg | ORAL_TABLET | Freq: Two times a day (BID) | ORAL | 1 refills | Status: AC | PRN

## 2019-09-27 NOTE — Progress Notes (Deleted)
Patient: [name] MRN: [id number] DOB: 1955-11-28 PCP: Orma Flaming, MD     Subjective:  Chief Complaint  Patient presents with  . Transitions Of Care  . Diabetes  . Hyperlipidemia  . vitamin d deficiency    HPI: The patient is a 64 y.o. female who presents today for ***  Review of Systems  Respiratory: Positive for shortness of breath. Negative for wheezing.   Cardiovascular: Negative for chest pain and palpitations.  Gastrointestinal: Negative for abdominal pain, nausea and vomiting.  Genitourinary: Negative for frequency, pelvic pain and urgency.  Neurological: Negative for dizziness, light-headedness and headaches.    Allergies Patient is allergic to asa [aspirin]; codeine; and sulfa antibiotics.  Past Medical History Patient  has a past medical history of ADHD, Chronic right-sided CHF (congestive heart failure) ([place]), Depression, GERD (gastroesophageal reflux disease), Migraine, PAH (pulmonary artery hypertension) ([place]), and Restless leg syndrome.  Surgical History Patient  has a past surgical history that includes Cholecystectomy; Tonsillectomy; RIGHT HEART CATH (N/A, 04/25/2018); and Cesarean section.  Family History Pateint's family history includes Alcohol abuse in her sister; Drug abuse in her sister; Early death in her sister; Emphysema in her father; Mental illness in her sister; Pancreatic cancer in her mother.  Social History Patient  reports that she has quit smoking. Her smoking use included cigarettes. She smoked 0.50 packs per day. She has never used smokeless tobacco. She reports that she does not drink alcohol or use drugs.    Objective: Vitals:   09/27/19 1326  Temp: 98.2 F (36.8 C)  Height: 5\' 7"  (1.702 m)    Body mass index is 35.87 kg/m.  Physical Exam     Assessment/plan:      No follow-ups on file.     @AWME @ 09/27/2019

## 2019-09-27 NOTE — Progress Notes (Signed)
Patient: [name] MRN: [id number] DOB: 09-Oct-1955 PCP: Orma Flaming, MD     Subjective:  Chief Complaint  Patient presents with  . Transitions Of Care  . Diabetes  . Hyperlipidemia  . vitamin d deficiency    HPI: The patient is a 64 y.o. female who presents today for transfer of care and follow up of chronic multiple medical issues. She has GAD, adult ADHD and depression and is followed by psychiatry for these issues. She has moderate PAH and only has seen pulmonary once. Has [place] oxygen at home, but is not on any today in office. Has dilated right heart failure, only seen cardiology once. On lasix.   1) type 2 diabetes Diabetes: Patient is here for follow up of type 2 diabetes. First diagnosed 2018  Currently on the following medications metformin 500mg  BID. Takes medications as prescribed. Last A1C was 6.5. Currently NOT exercising and following diabetic diet.  Denies any hypoglycemic events. Denies any vision changes, nausea, vomiting, abdominal pain, ulcers/paraesthesia in feet, polyuria, polydipsia or polyphagia. Denies any chest pain, shortness of breath. She has gained weight because all she does is sit around. She got laid off of her job and is worried about losing her house.   Hyperlipidemia Currently on 40mg  of lipitor daily. She has a past smoking history but stopped in 2014. She is sometimes around second hand smoke. Her father had a stroke. No heart history in family. She takes medication as prescribed.   The 10-year ASCVD risk score Mikey Bussing DC Jr., et al., 2013) is: 9.2%   Vitamin D deficiency -not taking daily replacement. She has them, but is not taking.   Right hand pain She states her right hand is so swollen and she can not close it at all. She states pain started about one month ago. She denies any trauma to this hand. She is left handed. Her left hand is starting to hurt as well. She also complains of shoulder pain and knee pain. Touching her hand makes it  worse. She can not use the hand at all. She has tried voltaren gel with no relief. biofreeze spray. She does have history of polycythemia.   PAH -moderate. Right heart cath in 2019:  Mixed PAH from combo of WHO group II and III and high output likely due to peripheral shunting in setting of lung disease and obesity. Followed up with pulm only once. Never completed pulm rehab. Does not like doctor that she saw. States she can not walk the distance in her office and states they didn't have wheelchairs. Not a candidate for pulmonary vasodilators at that time.   Review of Systems  Constitutional: Positive for fatigue. Negative for fever.  Respiratory: Positive for shortness of breath. Negative for chest tightness, wheezing and stridor.   Cardiovascular: Positive for leg swelling. Negative for chest pain and palpitations.  Gastrointestinal: Negative for abdominal pain, nausea and vomiting.  Musculoskeletal: Positive for arthralgias. Negative for back pain.  Skin: Negative for rash.  Neurological: Positive for weakness.  Psychiatric/Behavioral: Positive for dysphoric mood.    Allergies Patient is allergic to asa [aspirin]; codeine; and sulfa antibiotics.  Past Medical History Patient  has a past medical history of ADHD, Chronic right-sided CHF (congestive heart failure) ([place]), Depression, GERD (gastroesophageal reflux disease), Migraine, PAH (pulmonary artery hypertension) ([place]), and Restless leg syndrome.  Surgical History Patient  has a past surgical history that includes Cholecystectomy; Tonsillectomy; RIGHT HEART CATH (N/A, 04/25/2018); and Cesarean section.  Family History Pateint's family history  includes Alcohol abuse in her sister; Drug abuse in her sister; Early death in her sister; Emphysema in her father; Mental illness in her sister; Pancreatic cancer in her mother.  Social History Patient  reports that she has quit smoking. Her smoking use included cigarettes. She smoked 0.50  packs per day. She has never used smokeless tobacco. She reports that she does not drink alcohol or use drugs.    Objective: Vitals:   09/27/19 1326 09/27/19 1414  BP: 130/72   Pulse: (!) 105   Temp: 98.2 F (36.8 C)   TempSrc: Temporal   SpO2: (!) 85% 97%  Height: 5\' 7"  (1.702 m)     Body mass index is 35.87 kg/m.  Physical Exam Vitals reviewed.  Constitutional:      Appearance: Normal appearance. She is obese. She is not ill-appearing or toxic-appearing.  HENT:     Head: Normocephalic and atraumatic.  Cardiovascular:     Rate and Rhythm: Normal rate and regular rhythm.     Heart sounds: Normal heart sounds.  Pulmonary:     Effort: Pulmonary effort is normal.     Breath sounds: No wheezing.     Comments: Decreased breath sounds throughout  Abdominal:     General: Bowel sounds are normal.     Palpations: Abdomen is soft.  Skin:    Capillary Refill: Capillary refill takes less than 2 seconds.  Neurological:     General: No focal deficit present.     Mental Status: She is alert and oriented to person, place, and time.  Psychiatric:     Comments: Tearful during entire appointment        right hand xray: no acute findings. Possible OA in thumb joints.   Assessment/plan: 1. Type 2 diabetes mellitus with other circulatory complication, without long-term current use of insulin (HCC) Has not had her a1c checked in over a year. Checking routine labs today. She has gained weight, but a1c has been quite well controlled in the past. She does not exercise due to [facility]. She doesn't need any refills.  -needs eye/foot exam -ask about pneumonia vaccine at f/u.  - Comprehensive metabolic panel - Hemoglobin A1c - TSH - Microalbumin / creatinine urine ratio - CBC with Differential/Platelet  2. Hyperlipidemia associated with type 2 diabetes mellitus ([place])  - Lipid panel  3. Vitamin D deficiency  - VITAMIN D 25 Hydroxy (Vit-D Deficiency, Fractures)  4. Encounter for hepatitis  C screening test for low risk patient  - Hepatitis C antibody  5. Financial difficulty She is in tears today as she lost her job and is at risk of losing her house. She states she can not afford all of these doctor visits, but has not seen a doctor since before covid hit. She also needs a wheelchair but can not afford this or a ramp to put in her house. Will see if social work can help with this. Ill see if I can work on getting a WC as well for her with social work.  - Ambulatory referral to Social Work  6. PAH (pulmonary artery hypertension) ([place]) Must see pulmonologist. Discussed the importance of this. I'll do a new referral since she did not like other doctor. Also advised they call office and have wheelchair available for her as this can not be reason she does not go to physician. Also going to try and work on getting her a wheel chair she can keep in her car. Her oxygen was 85% when she got  here and I put oxygen on her. She tells me she wears oxygen at home from time to time. Discussed she may need to wear more often as her oxygen at 85% is not acceptable and can lead to serious problems. Also recommended she do pulm rehab in 2019, doesn't look like this was done.  - Ambulatory referral to Pulmonology  7. Bilateral hand pain Unsure if due to polycythemia/erythromelgia. She does have dupuytren's but this is not the cause of her pain. Checking xray, labs and giving tramadol prn. Will have close f/u.  - Rheumatoid factor - ANA, IFA Comprehensive Panel - DG Hand Complete Right; Future  8. Polycythemia Likely secondary to her chronic pulmonary disease. Check today/smear and make sure not contributing to peripheral pain.  - Pathologist smear review  9. Chronic right-sided CHF (congestive heart failure) ([place]) Needs referral to cardiology. Only on lasix. Do not have any notes. Last echo in 2019. Right heart acth in 2019. Recommended managing volume status closely. Has not seen cards in over a  year. Has not lost weight and looks like did not complete pulmonary rehab.  - Ambulatory referral to Cardiology  Total time of encounter: 50 minutes total time of encounter, including 45 minutes spent in face-to-face patient care. This time includes coordination of care and counseling regarding chronic illness, management, reviewing chart and multiple comorbidities. Remainder of non-face-to-face time involved reviewing chart documents/testing relevant to the patient encounter and documentation in the medical record.    This visit occurred during the SARS-CoV-2 public health emergency.  Safety protocols were in place, including screening questions prior to the visit, additional usage of staff PPE, and extensive cleaning of exam room while observing appropriate contact time as indicated for disinfecting solutions.    Return in about 3 months (around 12/28/2019) for diabetes. Orma Flaming, MD [place]  09/27/2019

## 2019-09-27 NOTE — Patient Instructions (Addendum)
-  you must go back and see the lung doctor. I did a new referral and requested a different provider. I would request a wheelchair be available to help you when you schedule appointment.   -cardiology referral placed as well within [facility], but feel free to continue to see cardiologist if you liked them in [place].   -lots of labs today. Will see if I can figure out why your hands are hurting so much. Will also xray the right hand.  -sent in tramadol for pain in the hands. Can take with tylenol.   -social work referral done to help with costs/meds/access to wheelchair. Let me know if you don't hear anything.   -please wear oxygen.. you were quite low today.

## 2019-09-28 ENCOUNTER — Other Ambulatory Visit: Payer: Self-pay | Admitting: Family Medicine

## 2019-09-28 ENCOUNTER — Telehealth: Payer: Self-pay | Admitting: Family Medicine

## 2019-09-28 DIAGNOSIS — E876 Hypokalemia: Secondary | ICD-10-CM

## 2019-09-28 LAB — MICROALBUMIN / CREATININE URINE RATIO
Creatinine,U: 95 mg/dL
Microalb Creat Ratio: 2.6 mg/g (ref 0.0–30.0)
Microalb, Ur: 2.5 mg/dL — ABNORMAL HIGH (ref 0.0–1.9)

## 2019-09-28 MED ORDER — POTASSIUM CHLORIDE CRYS ER 20 MEQ PO TBCR: 20.0000 meq | EXTENDED_RELEASE_TABLET | Freq: Every day | ORAL | 0 refills | Status: DC

## 2019-09-28 NOTE — Telephone Encounter (Signed)
Mr [name] calling to prior Auth for patients needs per last visit. Please Advise jk

## 2019-09-30 LAB — CBC WITH DIFFERENTIAL/PLATELET
Absolute Monocytes: 970 cells/uL — ABNORMAL HIGH (ref 200–950)
Basophils Absolute: 63 cells/uL (ref 0–200)
Basophils Relative: 0.5 %
Eosinophils Absolute: 50 cells/uL (ref 15–500)
Eosinophils Relative: 0.4 %
HCT: 44.4 % (ref 35.0–45.0)
Hemoglobin: 14.6 g/dL (ref 11.7–15.5)
Lymphs Abs: 2369 cells/uL (ref 850–3900)
MCH: 30.9 pg (ref 27.0–33.0)
MCHC: 32.9 g/dL (ref 32.0–36.0)
MCV: 94.1 fL (ref 80.0–100.0)
MPV: 11.7 fL (ref 7.5–12.5)
Monocytes Relative: 7.7 %
Neutro Abs: 9148 cells/uL — ABNORMAL HIGH (ref 1500–7800)
Neutrophils Relative %: 72.6 %
Platelets: 266 10*3/uL (ref 140–400)
RBC: 4.72 10*6/uL (ref 3.80–5.10)
RDW: 11.6 % (ref 11.0–15.0)
Total Lymphocyte: 18.8 %
WBC: 12.6 10*3/uL — ABNORMAL HIGH (ref 3.8–10.8)

## 2019-09-30 LAB — ANTI-NUCLEAR AB-TITER (ANA TITER)
ANA TITER: 1:80 {titer} — ABNORMAL HIGH
ANA Titer 1: 1:80 {titer} — ABNORMAL HIGH

## 2019-09-30 LAB — ANA, IFA COMPREHENSIVE PANEL
Anti Nuclear Antibody (ANA): POSITIVE — AB
ENA SM Ab Ser-aCnc: <1 AI
SM/RNP: <1 AI
SSA (Ro) (ENA) Antibody, IgG: <1 AI
SSB (La) (ENA) Antibody, IgG: <1 AI
Scleroderma (Scl-70) (ENA) Antibody, IgG: <1 AI
ds DNA Ab: <1 IU/mL

## 2019-09-30 LAB — HEPATITIS C ANTIBODY
Hepatitis C Ab: NONREACTIVE
SIGNAL TO CUT-OFF: 0 (ref <1.00)

## 2019-09-30 LAB — RHEUMATOID FACTOR: Rheumatoid fact SerPl-aCnc: <14 IU/mL (ref <14)

## 2019-09-30 LAB — PATHOLOGIST SMEAR REVIEW

## 2019-10-03 NOTE — Telephone Encounter (Signed)
Faxed last note to number Provided. [phone]

## 2019-10-04 ENCOUNTER — Other Ambulatory Visit: Payer: No Typology Code available for payment source

## 2019-10-04 NOTE — Telephone Encounter (Signed)
Lvm for pt to pick up inhaler samples. They will be located at the front desk.

## 2019-10-05 ENCOUNTER — Other Ambulatory Visit: Payer: Self-pay

## 2019-10-05 ENCOUNTER — Other Ambulatory Visit (INDEPENDENT_AMBULATORY_CARE_PROVIDER_SITE_OTHER): Payer: No Typology Code available for payment source

## 2019-10-05 DIAGNOSIS — E876 Hypokalemia: Secondary | ICD-10-CM

## 2019-10-05 LAB — BASIC METABOLIC PANEL
BUN: 15 mg/dL (ref 7–25)
CO2: 34 mmol/L — ABNORMAL HIGH (ref 20–32)
Calcium: 9.9 mg/dL (ref 8.6–10.4)
Chloride: 98 mmol/L (ref 98–110)
Creat: 0.88 mg/dL (ref 0.50–0.99)
Glucose, Bld: 118 mg/dL — ABNORMAL HIGH (ref 65–99)
Potassium: 5.2 mmol/L (ref 3.5–5.3)
Sodium: 145 mmol/L (ref 135–146)

## 2019-10-05 NOTE — Addendum Note (Signed)
Addended by: Christiana Fuchs on: 10/05/2019 08:51 AM   Modules accepted: Orders

## 2019-10-11 ENCOUNTER — Other Ambulatory Visit: Payer: Self-pay | Admitting: Family Medicine

## 2019-10-11 ENCOUNTER — Telehealth: Payer: Self-pay | Admitting: Family Medicine

## 2019-10-11 NOTE — Telephone Encounter (Signed)
Phone number went straight to voicemail

## 2019-10-11 NOTE — Telephone Encounter (Signed)
Please let her know that her potassium is normal, on high end... is she still taking the 54meq daily? I may cut her back some..  Thanks,  Dr.Malaia Buchta

## 2019-10-12 ENCOUNTER — Other Ambulatory Visit: Payer: Self-pay | Admitting: Family Medicine

## 2019-10-12 ENCOUNTER — Telehealth: Payer: Self-pay | Admitting: Pulmonary Disease

## 2019-10-12 DIAGNOSIS — I50812 Chronic right heart failure: Secondary | ICD-10-CM

## 2019-10-12 MED ORDER — POTASSIUM CHLORIDE ER 10 MEQ PO TBCR: 10.0000 meq | EXTENDED_RELEASE_TABLET | Freq: Every day | ORAL | 1 refills | Status: DC

## 2019-10-12 NOTE — Telephone Encounter (Signed)
LMOVM to return call to office 

## 2019-10-12 NOTE — Telephone Encounter (Signed)
Patient is willing to continue to see Dr. Elsworth Soho, she has changed her mind. She will call to schedule a video visit soon. No acute symptoms.

## 2019-10-12 NOTE — Telephone Encounter (Signed)
I spoke with pt to make her aware of new rx refill. Pt verbalized understanding. She says she has not been contacted by [facility]. I advised that she follow up at [facility] due to financial difficulty. Pt also voiced understanding.

## 2019-10-12 NOTE — Telephone Encounter (Signed)
I spoke with pt to give message below. Pt verbalized understanding. Pt also states that she is taking 20Meq daily.

## 2019-10-12 NOTE — Telephone Encounter (Signed)
Im going to drop her down to 60meq/day. Sent in new px for her.  Thanks! Aw

## 2019-10-14 NOTE — Progress Notes (Signed)
Cardiology Office Note:    Date:  10/15/2019   ID:  KENZLEY KE, DOB 21-Jul-1955, MRN [id number]  PCP:  Orma Flaming, MD  Cardiologist:  No primary care provider on file.  Electrophysiologist:  None   Referring MD: Orma Flaming, MD   Chief Complaint  Patient presents with  . Congestive Heart Failure    History of Present Illness:    [name] is a 64 y.o. female with a hx of pulmonary hypertension, right-sided heart failure, COPD, T2DM, hyperlipidemia who is referred by Dr. Rogers Blocker for evaluation of pulmonary hypertension/right heart failure.  TTE 04/23/2018 showed LVEF 60 to 22%, grade 1 diastolic dysfunction, mild RV dilatation, PASP 58 mmHg.  RHC on 04/25/2018 showed RA 13, RV 75/14, PA 75/33/47, PW 23,, PA sat 73%, CI 2.7.  Suspected mixed pulmonary hypertension with combination of group 2 (diastolic heart failure), group 3 (hypoxic lung disease) and high output due to lung disease/obesity.  She reports that she becomes short of breath with minimal exertion.  Denies any chest pain or palpitations.  She has been taking torsemide 60 mg twice daily and also takes metolazone 2.5 mg about once per week.  She has not been weighing herself as does not have a scale but takes metolazone when she notices swelling in her hand.  States that she has felt lightheaded with standing.    Past Medical History:  Diagnosis Date  . ADHD   . Chronic right-sided CHF (congestive heart failure) ([place])   . Depression   . GERD (gastroesophageal reflux disease)   . Migraine   . PAH (pulmonary artery hypertension) ([place])   . Restless leg syndrome     Past Surgical History:  Procedure Laterality Date  . CESAREAN SECTION    . CHOLECYSTECTOMY    . RIGHT HEART CATH N/A 04/25/2018   Procedure: RIGHT HEART CATH;  Surgeon: Jolaine Artist, MD;  Location: [facility];  Service: Cardiovascular;  Laterality: N/A;  . TONSILLECTOMY      Current Medications: Current Meds  Medication Sig  .  albuterol (PROVENTIL HFA;VENTOLIN HFA) 108 (90 Base) MCG/ACT inhaler INHALE 2 PUFFS INTO THE LUNGS EVERY 6 HOURS AS NEEDED  . ALPRAZolam (XANAX) 0.5 MG tablet Take 1 tablet (0.5 mg total) by mouth 3 (three) times daily as needed for anxiety.  Marland Kitchen atorvastatin (LIPITOR) 80 MG tablet Take 1 tablet (80 mg total) by mouth daily.  Marland Kitchen buPROPion (WELLBUTRIN XL) 300 MG 24 hr tablet TAKE 1 TABLET BY MOUTH EVERY DAY  . HYDROcodone-acetaminophen (NORCO/VICODIN) 5-325 MG tablet Take 1 tablet by mouth at bedtime. FOR CHRONIC LEG PAIN  . metFORMIN (GLUCOPHAGE) 500 MG tablet TAKE 1 TABLET(500 MG) BY MOUTH TWICE DAILY WITH A MEAL  . metolazone (ZAROXOLYN) 2.5 MG tablet Take 2 tablets (5 mg total) by mouth as directed. Take 5 mg on Monday and Thursday AS NEEDED ONLY for weight gain of 3 lbs or edema.  . montelukast (SINGULAIR) 10 MG tablet TAKE 1 TABLET (10 MG TOTAL) BY MOUTH DAILY. TAKE 10 MG BY MOUTH DAILY.  Marland Kitchen polyethylene glycol powder (GLYCOLAX/MIRALAX) 17 GM/SCOOP powder MIX 17 GRAMS WITH LIQUID AND TAKE  PO D PRN FOR MILD CONSTIPATION  . potassium chloride (KLOR-CON) 10 MEQ tablet Take 1 tablet (10 mEq total) by mouth daily.  Marland Kitchen torsemide (DEMADEX) 20 MG tablet Take 3 tablets (60 mg total) by mouth 2 (two) times daily.  Marland Kitchen umeclidinium-vilanterol (ANORO ELLIPTA) 62.5-25 MCG/INH AEPB INHALE 1 PUFF INTO THE LUNGS DAILY  . [  DISCONTINUED] atorvastatin (LIPITOR) 40 MG tablet Take 1 tablet (40 mg total) by mouth daily.     Allergies:   Asa [aspirin], Codeine, and Sulfa antibiotics   Social History   Socioeconomic History  . Marital status: Married    Spouse name: Statistician  . Number of children: 1  . Years of education: Not on file  . Highest education level: Some college, no degree  Occupational History    Employer: [employer]  Tobacco Use  . Smoking status: Former Smoker    Packs/day: 0.50    Types: Cigarettes  . Smokeless tobacco: Never Used  Substance and Sexual Activity  . Alcohol use: No  . Drug  use: No  . Sexual activity: Not Currently  Other Topics Concern  . Not on file  Social History Narrative  . Not on file   Social Determinants of Health   Financial Resource Strain:   . Difficulty of Paying Living Expenses:   Food Insecurity:   . Worried About Charity fundraiser in the Last Year:   . Arboriculturist in the Last Year:   Transportation Needs:   . Film/video editor (Medical):   Marland Kitchen Lack of Transportation (Non-Medical):   Physical Activity:   . Days of Exercise per Week:   . Minutes of Exercise per Session:   Stress:   . Feeling of Stress :   Social Connections:   . Frequency of Communication with Friends and Family:   . Frequency of Social Gatherings with Friends and Family:   . Attends Religious Services:   . Active Member of Clubs or Organizations:   . Attends Archivist Meetings:   Marland Kitchen Marital Status:      Family History: The patient's family history includes Alcohol abuse in her sister; Drug abuse in her sister; Early death in her sister; Emphysema in her father; Mental illness in her sister; Pancreatic cancer in her mother.  ROS:   Please see the history of present illness.     All other systems reviewed and are negative.  EKGs/Labs/Other Studies Reviewed:    The following studies were reviewed today:   EKG:  EKG is ordered today.  The ekg ordered today demonstrates sinus tachycardia, rate 106,  less than 1 mm ST depressions in II, III, aVF, V3-6  Recent Labs: 09/27/2019: ALT 19; Hemoglobin 14.6; Platelets 266; TSH 1.57 10/05/2019: BUN 15; Creat 0.88; Potassium 5.2; Sodium 145  Recent Lipid Panel    Component Value Date/Time   CHOL 207 (H) 09/27/2019 1426   TRIG 152.0 (H) 09/27/2019 1426   HDL 42.50 09/27/2019 1426   CHOLHDL 5 09/27/2019 1426   VLDL 30.4 09/27/2019 1426   LDLCALC 134 (H) 09/27/2019 1426   LDLDIRECT 149.0 09/04/2018 1011    Physical Exam:    VS:  BP 120/68   Pulse (!) 106   Temp (!) 97.1 F (36.2 C)   Ht 5'  7" (1.702 m)   SpO2 91%   BMI 35.87 kg/m     Wt Readings from Last 3 Encounters:  01/08/19 229 lb (103.9 kg)  11/06/18 229 lb (103.9 kg)  10/09/18 220 lb (99.8 kg)     GEN:  in no acute distress HEENT: Normal NECK: No JVD; No carotid bruits LYMPHATICS: No lymphadenopathy CARDIAC: tachycardic, regular, no murmurs, rubs, gallops RESPIRATORY:  Clear to auscultation without rales, wheezing or rhonchi  ABDOMEN: Soft, non-tender, non-distended MUSCULOSKELETAL:  No edema SKIN: Warm and dry NEUROLOGIC:  Alert and  oriented x 3 PSYCHIATRIC:  Normal affect   ASSESSMENT:    1. Pulmonary hypertension ([place])   2. Chronic right-sided CHF (congestive heart failure) ([place])   3. Medication management   4. Snoring   5. Hyperlipidemia, unspecified hyperlipidemia type    PLAN:    Pulmonary hypertension: RHC on 04/25/2018 showed RA 13, RV 75/14, PA 75/33/47, PW 23,, PA sat 73%, CI 2.7.  Suspected mixed pulmonary hypertension with combination of group 2 (diastolic heart failure), group 3 (hypoxic lung disease) and high output due to lung disease/obesity. -Taking torsemide 60 mg BID.  Appears euvolemic to dry on exam.  Will check BMP, magnesium.  Will provide patient with a scale and asked to weigh herself daily and call if gaining more than 3 pounds in a day or 5 pounds in a week.  Would hold off on metolazone unless gaining weight, as she appears dry on exam -Sleep study -Pulmonology referral  T2DM: On Metformin.  A1c 6.7  Hyperlipidemia: On atorvastatin 40 mg daily.  LDL 134. Increase atorvastatin to 80 mg daily.  Snoring: sleep study as above  RTC in 6-8 weeks  Medication Adjustments/Labs and Tests Ordered: Current medicines are reviewed at length with the patient today.  Concerns regarding medicines are outlined above.  Orders Placed This Encounter  Procedures  . Basic metabolic panel  . Magnesium  . EKG 12-Lead  . Split night study   Meds ordered this encounter  Medications  .  atorvastatin (LIPITOR) 80 MG tablet    Sig: Take 1 tablet (80 mg total) by mouth daily.    Dispense:  90 tablet    Refill:  3    Dose increase    Patient Instructions  Medication Instructions:  INCREASE atorvastatin (Lipitor) to 80 mg daily  *If you need a refill on your cardiac medications before your next appointment, please call your pharmacy*   Lab Work: BMET, Mag today  If you have labs (blood work) drawn today and your tests are completely normal, you will receive your results only by: Marland Kitchen MyChart Message (if you have MyChart) OR . A paper copy in the mail If you have any lab test that is abnormal or we need to change your treatment, we will call you to review the results.  Testing: Your physician has recommended that you have a sleep study. This test records several body functions during sleep, including: brain activity, eye movement, oxygen and carbon dioxide blood levels, heart rate and rhythm, breathing rate and rhythm, the flow of air through your mouth and nose, snoring, body muscle movements, and chest and belly movement.  Follow-Up: At [facility], you and your health needs are our priority.  As part of our continuing mission to provide you with exceptional heart care, we have created designated Provider Care Teams.  These Care Teams include your primary Cardiologist (physician) and Advanced Practice Providers (APPs -  Physician Assistants and Nurse Practitioners) who all work together to provide you with the care you need, when you need it.  We recommend signing up for the patient portal called "MyChart".  Sign up information is provided on this After Visit Summary.  MyChart is used to connect with patients for Virtual Visits (Telemedicine).  Patients are able to view lab/test results, encounter notes, upcoming appointments, etc.  Non-urgent messages can be sent to your provider as well.   To learn more about what you can do with MyChart, go to [email or web address].      Your next appointment:  6-8 week(s)  The format for your next appointment:   In Person  Provider:   Oswaldo Milian, MD   Other Instructions Weigh yourself daily, write it down.  Call the office if you gain 3 lbs overnight or 5 lbs in 1 week     Signed, Donato Heinz, MD  10/15/2019 5:48 PM    [place]

## 2019-10-15 ENCOUNTER — Other Ambulatory Visit: Payer: Self-pay

## 2019-10-15 ENCOUNTER — Ambulatory Visit (INDEPENDENT_AMBULATORY_CARE_PROVIDER_SITE_OTHER): Payer: No Typology Code available for payment source | Admitting: Cardiology

## 2019-10-15 VITALS — BP 120/68 | HR 106 | Temp 97.1°F | Ht 67.0 in

## 2019-10-15 DIAGNOSIS — R0683 Snoring: Secondary | ICD-10-CM

## 2019-10-15 DIAGNOSIS — I50812 Chronic right heart failure: Secondary | ICD-10-CM

## 2019-10-15 DIAGNOSIS — Z79899 Other long term (current) drug therapy: Secondary | ICD-10-CM | POA: Diagnosis not present

## 2019-10-15 DIAGNOSIS — I272 Pulmonary hypertension, unspecified: Secondary | ICD-10-CM | POA: Diagnosis not present

## 2019-10-15 DIAGNOSIS — E785 Hyperlipidemia, unspecified: Secondary | ICD-10-CM

## 2019-10-15 MED ORDER — ATORVASTATIN CALCIUM 80 MG PO TABS: 80.0000 mg | ORAL_TABLET | Freq: Every day | ORAL | 3 refills | Status: DC

## 2019-10-15 NOTE — Patient Instructions (Addendum)
Medication Instructions:  INCREASE atorvastatin (Lipitor) to 80 mg daily  *If you need a refill on your cardiac medications before your next appointment, please call your pharmacy*   Lab Work: BMET, Mag today  If you have labs (blood work) drawn today and your tests are completely normal, you will receive your results only by: Marland Kitchen MyChart Message (if you have MyChart) OR . A paper copy in the mail If you have any lab test that is abnormal or we need to change your treatment, we will call you to review the results.  Testing: Your physician has recommended that you have a sleep study. This test records several body functions during sleep, including: brain activity, eye movement, oxygen and carbon dioxide blood levels, heart rate and rhythm, breathing rate and rhythm, the flow of air through your mouth and nose, snoring, body muscle movements, and chest and belly movement.  Follow-Up: At [facility], you and your health needs are our priority.  As part of our continuing mission to provide you with exceptional heart care, we have created designated Provider Care Teams.  These Care Teams include your primary Cardiologist (physician) and Advanced Practice Providers (APPs -  Physician Assistants and Nurse Practitioners) who all work together to provide you with the care you need, when you need it.  We recommend signing up for the patient portal called "MyChart".  Sign up information is provided on this After Visit Summary.  MyChart is used to connect with patients for Virtual Visits (Telemedicine).  Patients are able to view lab/test results, encounter notes, upcoming appointments, etc.  Non-urgent messages can be sent to your provider as well.   To learn more about what you can do with MyChart, go to [email or web address].    Your next appointment:   6-8 week(s)  The format for your next appointment:   In Person  Provider:   Oswaldo Milian, MD   Other Instructions Weigh yourself  daily, write it down.  Call the office if you gain 3 lbs overnight or 5 lbs in 1 week

## 2019-10-16 ENCOUNTER — Telehealth: Payer: Self-pay | Admitting: Licensed Clinical Social Worker

## 2019-10-16 ENCOUNTER — Other Ambulatory Visit: Payer: Self-pay | Admitting: *Deleted

## 2019-10-16 LAB — BASIC METABOLIC PANEL
BUN/Creatinine Ratio: 20 (ref 12–28)
BUN: 16 mg/dL (ref 8–27)
CO2: 29 mmol/L (ref 20–29)
Calcium: 10.1 mg/dL (ref 8.7–10.3)
Chloride: 92 mmol/L — ABNORMAL LOW (ref 96–106)
Creatinine, Ser: 0.79 mg/dL (ref 0.57–1.00)
GFR calc Af Amer: 93 mL/min/{1.73_m2} (ref >59)
GFR calc non Af Amer: 80 mL/min/{1.73_m2} (ref >59)
Glucose: 102 mg/dL — ABNORMAL HIGH (ref 65–99)
Potassium: 3.8 mmol/L (ref 3.5–5.2)
Sodium: 140 mmol/L (ref 134–144)

## 2019-10-16 LAB — MAGNESIUM: Magnesium: 1.5 mg/dL — ABNORMAL LOW (ref 1.6–2.3)

## 2019-10-16 MED ORDER — MAGNESIUM OXIDE 400 MG PO CAPS: 400.0000 mg | ORAL_CAPSULE | Freq: Every day | ORAL | 0 refills | Status: AC

## 2019-10-16 NOTE — Telephone Encounter (Signed)
CSW referred to assist patient with obtaining a scale. CSW contacted patient to inform scale will be delivered to home. Patient grateful for support and assistance. CSW available as needed. Jackie Torie Priebe, LCSW, CCSW-MCS [phone]  

## 2019-10-21 ENCOUNTER — Other Ambulatory Visit: Payer: Self-pay | Admitting: Family Medicine

## 2019-10-21 DIAGNOSIS — E1159 Type 2 diabetes mellitus with other circulatory complications: Secondary | ICD-10-CM

## 2019-10-25 ENCOUNTER — Other Ambulatory Visit: Payer: Self-pay | Admitting: Family Medicine

## 2019-10-26 ENCOUNTER — Telehealth: Payer: Self-pay | Admitting: *Deleted

## 2019-10-26 NOTE — Telephone Encounter (Signed)
-----   Message from Silverio Lay, RN sent at 10/15/2019  4:12 PM EDT ----- Regarding: split night Sleep study ordered per Dr. Gardiner Rhyme.  Thanks!

## 2019-10-28 ENCOUNTER — Other Ambulatory Visit: Payer: Self-pay | Admitting: Family Medicine

## 2019-10-29 NOTE — Telephone Encounter (Signed)
Last refill: 10/02/19 #30, 1 Last OV: 09/27/19 dx. TOC

## 2019-10-31 ENCOUNTER — Telehealth: Payer: Self-pay

## 2019-10-31 NOTE — Telephone Encounter (Signed)
Pt is calling in stating refill was denied and is asking for an update.

## 2019-11-01 ENCOUNTER — Telehealth: Payer: Self-pay

## 2019-11-01 NOTE — Telephone Encounter (Signed)
Error

## 2019-11-01 NOTE — Telephone Encounter (Signed)
Patient has been seen one time for [facility] that was over a 60 minute appointment due to complexity of issues and poor follow up by patient. Her hand was hurting her and work up was essentially normal. Possibly low potassium which I replaced and gave her a short supply of tramadol. If hand is still hurting she needs to follow up. Tramadol was never prescribed long term. If patient doesn't agree she can find another provider to take over care. We can continue to take care of her for next 30 days.  Orma Flaming, MD [place]

## 2019-11-01 NOTE — Telephone Encounter (Signed)
I called pt to give message below. Pt burst into uncontrollable crying saying that she needs another Dr. I assured her that she can take Tylenol for pain, and that the Tramadol was just as needed. She disconnected the call.

## 2019-11-01 NOTE — Telephone Encounter (Signed)
The tramadol was not meant to be a long term solution. She has not followed up either.  Dr. Rogers Blocker

## 2019-11-01 NOTE — Telephone Encounter (Signed)
Pt has called crying and demanding that someone fix her hand. I recommended that she schedule an appointment to come in to the office. She says that all we do is send her to other Dr.'s. I explained that she has already had an xray that was normal, and that if we need to refer her to another Provider to further assist her than that is what we gave to do. She refused the appointment and says that she may need another Dr. The call was disconnected by pt.   Please Advise.

## 2019-11-30 ENCOUNTER — Other Ambulatory Visit: Payer: Self-pay | Admitting: Cardiology

## 2019-11-30 MED ORDER — TORSEMIDE 20 MG PO TABS: 60.0000 mg | ORAL_TABLET | Freq: Two times a day (BID) | ORAL | 11 refills | Status: DC

## 2019-11-30 NOTE — Telephone Encounter (Signed)
*  STAT* If patient is at the pharmacy, call can be transferred to refill team.   1. Which medications need to be refilled? (please list name of each medication and dose if known) torsemide (DEMADEX) 20 MG tablet  2. Which pharmacy/location (including street and city if local pharmacy) is medication to be sent to? [id number] [place], [place] [address]  3. Do they need a 30 day or 90 day supply? 90 day  Patient is out of medication

## 2019-12-03 NOTE — Progress Notes (Signed)
Virtual Visit via Telephone Note   This visit type was conducted due to national recommendations for restrictions regarding the COVID-19 Pandemic (e.g. social distancing) in an effort to limit this patient's exposure and mitigate transmission in our community.  Due to her co-morbid illnesses, this patient is at least at moderate risk for complications without adequate follow up.  This format is felt to be most appropriate for this patient at this time.  The patient did not have access to video technology/had technical difficulties with video requiring transitioning to audio format only (telephone).  All issues noted in this document were discussed and addressed.  No physical exam could be performed with this format.  Please refer to the patient's chart for her  consent to telehealth for [facility].   Date:  12/04/2019   ID:  [name], DOB 05-11-1955, MRN [id number]  Patient Location: Home Provider Location: Office/Clinic  PCP:  Orma Flaming, MD  Cardiologist:  Donato Heinz, MD  Electrophysiologist:  None   Evaluation Performed:  Follow-Up Visit  Chief Complaint:  Dyspnea  History of Present Illness:    [name] is a 64 y.o. female with a hx of pulmonary hypertension, right-sided heart failure, COPD, T2DM, hyperlipidemia who presents for follow-up.  She was referred by Dr. Rogers Blocker for evaluation of pulmonary hypertension/right heart failure, initially seen on 10/15/2019.  TTE 04/23/2018 showed LVEF 60 to 92%, grade 1 diastolic dysfunction, mild RV dilatation, PASP 58 mmHg.  RHC on 04/25/2018 showed RA 13, RV 75/14, PA 75/33/47, PW 23, PA sat 73%, CI 2.7.  Suspected mixed pulmonary hypertension with combination of group 2 (diastolic heart failure), group 3 (hypoxic lung disease) and high output due to lung disease/obesity.  She had been taking torsemide 60 mg twice daily and also takes metolazone 2.5 mg about once per week.    At initial clinic visit on 10/15/2019, she  appeared dry on exam and it was recommended that she hold off on metolazone.  She was provided a scale and asked to monitor weight daily with instructions to take metolazone if gaining more than 3 pounds in a day or 5 pounds in a week.  Since last clinic visit, she reports she has been taking torsemide 60 mg twice daily.  She reports that she has been weighing herself daily and weight was up 4 pounds last week.  She took 2.5 mg of metolazone and weight came down.  States that she continues to have shortness of breath with minimal exertion.  She denies any chest pain, syncope, or palpitations.  Reports lightheadedness has improved.   Wt Readings from Last 3 Encounters:  12/04/19 (!) 244 lb 12.8 oz (111 kg)  01/08/19 229 lb (103.9 kg)  11/06/18 229 lb (103.9 kg)      Past Medical History:  Diagnosis Date  . ADHD   . Chronic right-sided CHF (congestive heart failure) ([place])   . Depression   . GERD (gastroesophageal reflux disease)   . Migraine   . PAH (pulmonary artery hypertension) ([place])   . Restless leg syndrome    Past Surgical History:  Procedure Laterality Date  . CESAREAN SECTION    . CHOLECYSTECTOMY    . RIGHT HEART CATH N/A 04/25/2018   Procedure: RIGHT HEART CATH;  Surgeon: Jolaine Artist, MD;  Location: [place] [facility];  Service: Cardiovascular;  Laterality: N/A;  . TONSILLECTOMY       Current Meds  Medication Sig  . albuterol (PROVENTIL HFA;VENTOLIN HFA) 108 (  90 Base) MCG/ACT inhaler INHALE 2 PUFFS INTO THE LUNGS EVERY 6 HOURS AS NEEDED  . ALPRAZolam (XANAX) 0.5 MG tablet Take 1 tablet (0.5 mg total) by mouth 3 (three) times daily as needed for anxiety.  Marland Kitchen atorvastatin (LIPITOR) 80 MG tablet Take 1 tablet (80 mg total) by mouth daily.  Marland Kitchen buPROPion (WELLBUTRIN XL) 300 MG 24 hr tablet TAKE 1 TABLET BY MOUTH EVERY DAY  . Magnesium Oxide 400 MG CAPS Take 1 capsule (400 mg total) by mouth daily.  . metFORMIN (GLUCOPHAGE) 500 MG tablet TAKE 1 TABLET(500 MG) BY MOUTH  TWICE DAILY WITH A MEAL  . metolazone (ZAROXOLYN) 2.5 MG tablet Take 2 tablets (5 mg total) by mouth as directed. Take 5 mg on Monday and Thursday AS NEEDED ONLY for weight gain of 3 lbs or edema.  . montelukast (SINGULAIR) 10 MG tablet TAKE 1 TABLET (10 MG TOTAL) BY MOUTH DAILY. TAKE 10 MG BY MOUTH DAILY.  Marland Kitchen polyethylene glycol powder (GLYCOLAX/MIRALAX) 17 GM/SCOOP powder MIX 17 GRAMS WITH LIQUID AND TAKE  PO D PRN FOR MILD CONSTIPATION  . potassium chloride (KLOR-CON) 10 MEQ tablet Take 1 tablet (10 mEq total) by mouth daily.  Marland Kitchen torsemide (DEMADEX) 20 MG tablet Take 3 tablets (60 mg total) by mouth 2 (two) times daily.     Allergies:   Asa [aspirin], Codeine, and Sulfa antibiotics   Social History   Tobacco Use  . Smoking status: Former Smoker    Packs/day: 0.50    Types: Cigarettes  . Smokeless tobacco: Never Used  Vaping Use  . Vaping Use: Never used  Substance Use Topics  . Alcohol use: No  . Drug use: No     Family Hx: The patient's family history includes Alcohol abuse in her sister; Drug abuse in her sister; Early death in her sister; Emphysema in her father; Mental illness in her sister; Pancreatic cancer in her mother.  ROS:   Please see the history of present illness.     All other systems reviewed and are negative.   Prior CV studies:   The following studies were reviewed today:   Labs/Other Tests and Data Reviewed:    EKG:  No ECG reviewed.  Recent Labs: 09/27/2019: ALT 19; Hemoglobin 14.6; Platelets 266; TSH 1.57 10/15/2019: BUN 16; Creatinine, Ser 0.79; Magnesium 1.5; Potassium 3.8; Sodium 140   Recent Lipid Panel Lab Results  Component Value Date/Time   CHOL 207 (H) 09/27/2019 02:26 PM   TRIG 152.0 (H) 09/27/2019 02:26 PM   HDL 42.50 09/27/2019 02:26 PM   CHOLHDL 5 09/27/2019 02:26 PM   LDLCALC 134 (H) 09/27/2019 02:26 PM   LDLDIRECT 149.0 09/04/2018 10:11 AM    Wt Readings from Last 3 Encounters:  12/04/19 (!) 244 lb 12.8 oz (111 kg)  01/08/19  229 lb (103.9 kg)  11/06/18 229 lb (103.9 kg)     Objective:    Vital Signs:  Pulse 98   Ht 5\' 7"  (1.702 m)   Wt (!) 244 lb 12.8 oz (111 kg)   SpO2 98%   BMI 38.34 kg/m    VITAL SIGNS:  reviewed  Gen: tearful during conversation.  No respiratory distress  ASSESSMENT & PLAN:    Pulmonary hypertension: RHC on 04/25/2018 showed RA 13, RV 75/14, PA 75/33/47, PW 23,, PA sat 73%, CI 2.7.  Suspected mixed pulmonary hypertension with combination of group 2 (diastolic heart failure), group 3 (hypoxic lung disease) and high output due to lung disease/obesity. -Taking torsemide 60 mg BID.  Asked  to weigh herself daily and can take metolazone 2.5 mg if gaining more than 3 pounds in a day or 5 pounds in a week.  Will check BMP, magnesium -Sleep study -Pulmonology referral  T2DM: On Metformin.  A1c 6.7  Hyperlipidemia: LDL 134 on atorvastatin 40 mg daily, was increased to atorvastatin to 80 mg daily in June 2021  Snoring: sleep study as above  Patient reports that she has been having financial difficulties, will refer to her social worker to discuss available resources  RTC in  1 month    Time:   Today, I have spent 15 minutes with the patient with telehealth technology discussing the above problems.     Medication Adjustments/Labs and Tests Ordered: Current medicines are reviewed at length with the patient today.  Concerns regarding medicines are outlined above.   Tests Ordered: Orders Placed This Encounter  Procedures  . Basic metabolic panel  . Magnesium    Medication Changes: No orders of the defined types were placed in this encounter.   Follow Up:  In Person in 1 month(s)  Signed, Donato Heinz, MD  12/04/2019 12:14 PM    [facility]

## 2019-12-04 ENCOUNTER — Telehealth: Payer: Self-pay | Admitting: Hospice

## 2019-12-04 ENCOUNTER — Telehealth (INDEPENDENT_AMBULATORY_CARE_PROVIDER_SITE_OTHER): Payer: No Typology Code available for payment source | Admitting: Cardiology

## 2019-12-04 ENCOUNTER — Encounter: Payer: Self-pay | Admitting: Cardiology

## 2019-12-04 ENCOUNTER — Telehealth: Payer: Self-pay | Admitting: *Deleted

## 2019-12-04 VITALS — HR 98 | Ht 67.0 in | Wt 244.8 lb

## 2019-12-04 DIAGNOSIS — E785 Hyperlipidemia, unspecified: Secondary | ICD-10-CM | POA: Diagnosis not present

## 2019-12-04 DIAGNOSIS — I272 Pulmonary hypertension, unspecified: Secondary | ICD-10-CM | POA: Diagnosis not present

## 2019-12-04 DIAGNOSIS — I50812 Chronic right heart failure: Secondary | ICD-10-CM

## 2019-12-04 DIAGNOSIS — R0683 Snoring: Secondary | ICD-10-CM

## 2019-12-04 DIAGNOSIS — Z79899 Other long term (current) drug therapy: Secondary | ICD-10-CM | POA: Diagnosis not present

## 2019-12-04 NOTE — Patient Instructions (Signed)
Medication Instructions:  No Changes *If you need a refill on your cardiac medications before your next appointment, please call your pharmacy*   Lab Work: None Ordered If you have labs (blood work) drawn today and your tests are completely normal, you will receive your results only by: Marland Kitchen MyChart Message (if you have MyChart) OR . A paper copy in the mail If you have any lab test that is abnormal or we need to change your treatment, we will call you to review the results.   Testing/Procedures: None Ordered   Follow-Up: At [facility], you and your health needs are our priority.  As part of our continuing mission to provide you with exceptional heart care, we have created designated Provider Care Teams.  These Care Teams include your primary Cardiologist (physician) and Advanced Practice Providers (APPs -  Physician Assistants and Nurse Practitioners) who all work together to provide you with the care you need, when you need it.  We recommend signing up for the patient portal called "MyChart".  Sign up information is provided on this After Visit Summary.  MyChart is used to connect with patients for Virtual Visits (Telemedicine).  Patients are able to view lab/test results, encounter notes, upcoming appointments, etc.  Non-urgent messages can be sent to your provider as well.   To learn more about what you can do with MyChart, go to [email or web address].    Your next appointment:   1 month(s)  The format for your next appointment:   In Person  Provider:   Oswaldo Milian, MD   Other Instructions

## 2019-12-04 NOTE — Telephone Encounter (Signed)
Left message for patient to call back (needs 17m f/u and labs completed). VV this morning with Dr. Gardiner Rhyme  Medication Instructions:  No Changes *If you need a refill on your cardiac medications before your next appointment, please call your pharmacy*   Lab Work: BMET, Mag needed  If you have labs (blood work) drawn today and your tests are completely normal, you will receive your results only by:  [place] (if you have MyChart) OR  A paper copy in the mail If you have any lab test that is abnormal or we need to change your treatment, we will call you to review the results.   Testing/Procedures: None Ordered   Follow-Up: At [facility], you and your health needs are our priority. As part of our continuing mission to provide you with exceptional heart care, we have created designated Provider Care Teams. These Care Teams include your primary Cardiologist (physician) and Advanced Practice Providers (APPs -  Physician Assistants and Nurse Practitioners) who all work together to provide you with the care you need, when you need it.  We recommend signing up for the patient portal called "MyChart".  Sign up information is provided on this After Visit Summary.  MyChart is used to connect with patients for Virtual Visits (Telemedicine).  Patients are able to view lab/test results, encounter notes, upcoming appointments, etc.  Non-urgent messages can be sent to your provider as well.   To learn more about what you can do with MyChart, go to [email or web address].    Your next appointment:   1 month(s)  The format for your next appointment:   In Person  Provider:   Oswaldo Milian, MD

## 2019-12-04 NOTE — Telephone Encounter (Signed)
Spoke with patient regarding Palliative services and she was in agreement with scheduling visit.  I have scheduled an In-person Consult for 12/19/19 @ 3 PM.

## 2019-12-11 ENCOUNTER — Other Ambulatory Visit (HOSPITAL_COMMUNITY): Payer: Self-pay | Admitting: Psychiatry

## 2019-12-11 ENCOUNTER — Telehealth (HOSPITAL_COMMUNITY): Payer: Self-pay | Admitting: *Deleted

## 2019-12-11 MED ORDER — AMPHETAMINE-DEXTROAMPHETAMINE 10 MG PO TABS: 10.0000 mg | ORAL_TABLET | Freq: Three times a day (TID) | ORAL | 0 refills | Status: DC

## 2019-12-11 NOTE — Telephone Encounter (Signed)
Pt called requesting refill of Adderall 10mg  (total 30mg ) last ordered 11/14/19. Pt has an upcoming appointment on 12/18/19. Please review.

## 2019-12-11 NOTE — Telephone Encounter (Signed)
Done

## 2019-12-18 ENCOUNTER — Telehealth (INDEPENDENT_AMBULATORY_CARE_PROVIDER_SITE_OTHER): Payer: Self-pay | Admitting: Psychiatry

## 2019-12-18 ENCOUNTER — Telehealth: Payer: Self-pay

## 2019-12-18 ENCOUNTER — Other Ambulatory Visit: Payer: Self-pay

## 2019-12-18 DIAGNOSIS — F330 Major depressive disorder, recurrent, mild: Secondary | ICD-10-CM

## 2019-12-18 DIAGNOSIS — F411 Generalized anxiety disorder: Secondary | ICD-10-CM

## 2019-12-18 DIAGNOSIS — Z789 Other specified health status: Secondary | ICD-10-CM

## 2019-12-18 DIAGNOSIS — F909 Attention-deficit hyperactivity disorder, unspecified type: Secondary | ICD-10-CM

## 2019-12-18 MED ORDER — AMPHETAMINE-DEXTROAMPHETAMINE 10 MG PO TABS
10.0000 mg | ORAL_TABLET | Freq: Three times a day (TID) | ORAL | 0 refills | Status: DC
Start: 2020-01-11 — End: 2020-06-12

## 2019-12-18 MED ORDER — AMPHETAMINE-DEXTROAMPHETAMINE 10 MG PO TABS
10.0000 mg | ORAL_TABLET | Freq: Three times a day (TID) | ORAL | 0 refills | Status: DC
Start: 2020-02-10 — End: 2020-01-11

## 2019-12-18 MED ORDER — ALPRAZOLAM 0.5 MG PO TABS: 0.5000 mg | ORAL_TABLET | Freq: Three times a day (TID) | ORAL | 2 refills | Status: DC | PRN

## 2019-12-18 NOTE — Progress Notes (Signed)
[facility] MD/PA/NP OP Progress Note  12/18/2019 11:05 AM URI COVEY  MRN:  [id number] Interview was conducted by phone and I verified that I was speaking with the correct person using two identifiers. I discussed the limitations of evaluation and management by telemedicine and  the availability of in person appointments. Patient expressed understanding and agreed to proceed. Patient location - home; physician - home office.  Chief Complaint: Anxiety, pain.  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 She also has a hx of ADD. Her PCP wanted her to be followed by mental health and refused to prescribe Adderall which patient has been on for some time. No SI now or ever. No hx of mania,psychosis, no alcohol/drug abuse.Shewas onLexapro 20 mg daily, Adderall 20 mg in AM and 10 mg at noon. Addition ofAbilify (2 mg) to augment Lexaprodid not help and she noticed increased appetite even after she stopped it.In thepast she tried several SSRIs/SNRIs as well as Trintellix with questionable benefit. She was also on bupropion but could not recall if it was helpful and given her chronic fatigue we decided to try it again. She started on Wellbutrin XL 150 mg andthen increased it to 300 mg. Shetolerates it welland her mood has improved some. She lost her insurance in middle of last year. She had to stop taking Adderall as it is not covered by her new insurance but now it has been restarted. We have restarted Wellbutrin and alprazolam and she finds both medications helpful. Multiple medical problems (CHF, DM, OSA, emphysema, right hand and shoulder pain) as well as financial stress contribute to chronic low grade depression.  Visit Diagnosis:    ICD-10-CM   1. GAD (generalized anxiety disorder)  F41.1   2. Mild episode  of recurrent major depressive disorder (HCC)  F33.0   3. Adult ADHD  F90.9     Past Psychiatric History: Please see intake H&P.  Past Medical History:  Past Medical History:  Diagnosis Date  . ADHD   . Chronic right-sided CHF (congestive heart failure) ([place])   . Depression   . GERD (gastroesophageal reflux disease)   . Migraine   . PAH (pulmonary artery hypertension) ([place])   . Restless leg syndrome     Past Surgical History:  Procedure Laterality Date  . CESAREAN SECTION    . CHOLECYSTECTOMY    . RIGHT HEART CATH N/A 04/25/2018   Procedure: RIGHT HEART CATH;  Surgeon: Jolaine Artist, MD;  Location: [facility];  Service: Cardiovascular;  Laterality: N/A;  . TONSILLECTOMY      Family Psychiatric History: Reeviewed.  Family History:  Family History  Problem Relation Age of Onset  . Alcohol abuse Sister   . Drug abuse Sister   . Mental illness Sister   . Early death Sister   . Pancreatic cancer Mother   . Emphysema Father     Social History:  Social History   Socioeconomic History  . Marital status: Married    Spouse name: Statistician  . Number of children: 1  . Years of education: Not on file  . Highest education level: Some college, no degree  Occupational History    Employer: [employer]  Tobacco Use  . Smoking status: Former Smoker    Packs/day: 0.50    Types: Cigarettes  . Smokeless tobacco: Never Used  Vaping  Use  . Vaping Use: Never used  Substance and Sexual Activity  . Alcohol use: No  . Drug use: No  . Sexual activity: Not Currently  Other Topics Concern  . Not on file  Social History Narrative  . Not on file   Social Determinants of Health   Financial Resource Strain:   . Difficulty of Paying Living Expenses:   Food Insecurity:   . Worried About Charity fundraiser in the Last Year:   . Arboriculturist in the Last Year:   Transportation Needs:   . Film/video editor (Medical):   Marland Kitchen Lack of Transportation (Non-Medical):    Physical Activity:   . Days of Exercise per Week:   . Minutes of Exercise per Session:   Stress:   . Feeling of Stress :   Social Connections:   . Frequency of Communication with Friends and Family:   . Frequency of Social Gatherings with Friends and Family:   . Attends Religious Services:   . Active Member of Clubs or Organizations:   . Attends Archivist Meetings:   Marland Kitchen Marital Status:     Allergies:  Allergies  Allergen Reactions  . Asa [Aspirin] Nausea Only    abd pain  . Codeine Nausea Only  . Sulfa Antibiotics Hives    Metabolic Disorder Labs: Lab Results  Component Value Date   HGBA1C 6.7 (H) 09/27/2019   MPG 148.46 04/25/2018   No results found for: PROLACTIN Lab Results  Component Value Date   CHOL 207 (H) 09/27/2019   TRIG 152.0 (H) 09/27/2019   HDL 42.50 09/27/2019   CHOLHDL 5 09/27/2019   VLDL 30.4 09/27/2019   LDLCALC 134 (H) 09/27/2019   Lab Results  Component Value Date   TSH 1.57 09/27/2019   TSH 2.84 09/04/2018    Therapeutic Level Labs: No results found for: LITHIUM No results found for: VALPROATE No components found for:  CBMZ  Current Medications: Current Outpatient Medications  Medication Sig Dispense Refill  . albuterol (PROVENTIL HFA;VENTOLIN HFA) 108 (90 Base) MCG/ACT inhaler INHALE 2 PUFFS INTO THE LUNGS EVERY 6 HOURS AS NEEDED    . [START ON 12/23/2019] ALPRAZolam (XANAX) 0.5 MG tablet Take 1 tablet (0.5 mg total) by mouth 3 (three) times daily as needed for anxiety. 90 tablet 2  . amphetamine-dextroamphetamine (ADDERALL) 10 MG tablet Take 1 tablet (10 mg total) by mouth 3 (three) times daily. 90 tablet 0  . [START ON 01/11/2020] amphetamine-dextroamphetamine (ADDERALL) 10 MG tablet Take 1 tablet (10 mg total) by mouth 3 (three) times daily. 90 tablet 0  . [START ON 02/10/2020] amphetamine-dextroamphetamine (ADDERALL) 10 MG tablet Take 1 tablet (10 mg total) by mouth 3 (three) times daily. 90 tablet 0  . atorvastatin (LIPITOR)  80 MG tablet Take 1 tablet (80 mg total) by mouth daily. 90 tablet 3  . buPROPion (WELLBUTRIN XL) 300 MG 24 hr tablet TAKE 1 TABLET BY MOUTH EVERY DAY 90 tablet 1  . HYDROcodone-acetaminophen (NORCO/VICODIN) 5-325 MG tablet Take 1 tablet by mouth at bedtime. FOR CHRONIC LEG PAIN (Patient not taking: Reported on 12/04/2019) 30 tablet 0  . Magnesium Oxide 400 MG CAPS Take 1 capsule (400 mg total) by mouth daily. 30 capsule 0  . metFORMIN (GLUCOPHAGE) 500 MG tablet TAKE 1 TABLET(500 MG) BY MOUTH TWICE DAILY WITH A MEAL 180 tablet 0  . metolazone (ZAROXOLYN) 2.5 MG tablet Take 2 tablets (5 mg total) by mouth as directed. Take 5 mg on Monday  and Thursday AS NEEDED ONLY for weight gain of 3 lbs or edema. 20 tablet 0  . montelukast (SINGULAIR) 10 MG tablet TAKE 1 TABLET (10 MG TOTAL) BY MOUTH DAILY. TAKE 10 MG BY MOUTH DAILY. 90 tablet 0  . polyethylene glycol powder (GLYCOLAX/MIRALAX) 17 GM/SCOOP powder MIX 17 GRAMS WITH LIQUID AND TAKE  PO D PRN FOR MILD CONSTIPATION    . potassium chloride (KLOR-CON) 10 MEQ tablet Take 1 tablet (10 mEq total) by mouth daily. 90 tablet 1  . torsemide (DEMADEX) 20 MG tablet Take 3 tablets (60 mg total) by mouth 2 (two) times daily. 180 tablet 11  . umeclidinium-vilanterol (ANORO ELLIPTA) 62.5-25 MCG/INH AEPB INHALE 1 PUFF INTO THE LUNGS DAILY (Patient not taking: Reported on 12/04/2019) 180 each 3   No current facility-administered medications for this visit.     Psychiatric Specialty Exam: Review of Systems  Musculoskeletal: Positive for arthralgias.  Psychiatric/Behavioral: The patient is nervous/anxious.   All other systems reviewed and are negative.   There were no vitals taken for this visit.There is no height or weight on file to calculate BMI.  General Appearance: NA  Eye Contact:  NA  Speech:  Clear and Coherent and Normal Rate  Volume:  Normal  Mood:  Anxious and Depressed  Affect:  NA  Thought Process:  Goal Directed  Orientation:  Full (Time, Place,  and Person)  Thought Content: Rumination   Suicidal Thoughts:  No  Homicidal Thoughts:  No  Memory:  Immediate;   Good Recent;   Good Remote;   Good  Judgement:  Good  Insight:  Fair  Psychomotor Activity:  NA  Concentration:  Concentration: Fair  Recall:  Good  Fund of Knowledge: Good  Language: Good  Akathisia:  Negative  Handed:  Right  AIMS (if indicated): not done  Assets:  Communication Skills Desire for Improvement Financial Resources/Insurance Housing Resilience  ADL's:  Intact  Cognition: WNL  Sleep:  Fair   Screenings: GAD-7     Office Visit from 09/04/2018 in [place]  Total GAD-7 Score 7    PHQ2-9     Office Visit from 01/08/2019 in [facility] Visit from 09/04/2018 in [facility]  PHQ-2 Total Score 6 6  PHQ-9 Total Score 12 14       Assessment and Plan: 64yo married female with long standing problems with anxiety and depression which worsened 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 She also has a hx of ADD. Her PCP wanted her to be followed by mental health and refused to prescribe Adderall which patient has been on for some time. No SI now or ever. No hx of mania,psychosis, no alcohol/drug abuse.Shewas onLexapro 20 mg daily, Adderall 20 mg in AM and 10 mg at noon. Addition ofAbilify (2 mg) to augment Lexaprodid not help and she noticed increased appetite even after she stopped it.In thepast she tried several SSRIs/SNRIs as well as Trintellix with questionable benefit. She was also on bupropion but could not recall if it was helpful and given her chronic fatigue we decided to try it again. She started on Wellbutrin XL 150 mg andthen increased it to 300 mg. Shetolerates it welland her mood has improved some. She lost her insurance in middle of last year. She had to stop taking  Adderall as it is not covered by her new insurance but now it has been restarted. We  have restarted Wellbutrin and alprazolam and she finds both medications helpful. Multiple medical problems (CHF, DM, OSA, emphysema, right hand and shoulder pain) as well as financial stress contribute to chronic low grade depression.  Dx: Dysthymic disorder; ADD adult type  Plan:ContinueWellbutrin XL to 300 mg, alprazolam 0.5 mg prn anxiety and Adderall20 mg in AM and 10 mg at noon.Next visit in3 months.The plan was discussed with patient who had an opportunity to ask questions and these were all answered. I spend69minutes in phone consultation with the patient.    Stephanie Acre, MD 12/18/2019, 11:05 AM

## 2019-12-18 NOTE — Telephone Encounter (Signed)
Called patient to discuss concerns with financial needs that were brought up in a recent visit with Dr. Gardiner Rhyme on 7/27. Left patient a message to return call to Care Guide at [phone].

## 2019-12-19 ENCOUNTER — Other Ambulatory Visit: Payer: Self-pay

## 2019-12-19 ENCOUNTER — Other Ambulatory Visit: Payer: No Typology Code available for payment source | Admitting: Hospice

## 2019-12-19 DIAGNOSIS — J9611 Chronic respiratory failure with hypoxia: Secondary | ICD-10-CM

## 2019-12-19 DIAGNOSIS — Z515 Encounter for palliative care: Secondary | ICD-10-CM

## 2019-12-19 NOTE — Progress Notes (Signed)
[facility] Palliative Care Consult Note Telephone: [phone]  Fax: [phone]  PATIENT NAME: [name] DOB: 01/26/1956 MRN: [id number]  PRIMARY CARE PROVIDER:   Orma Flaming, MD  REFERRING PROVIDER: Orma Flaming, MD  RESPONSIBLE PARTY:  [id number] [phone] Contact: Spouse- Art Sallee Lange [phone]    RECOMMENDATIONS/PLAN:   Advance Care Planning/Goals of Care: Visit at the request of Orma Flaming, MD  for palliative consult. Visit consisted of building trust and discussions on Palliative Medicine as specialized medical care for people living with serious illness, aimed at facilitating better quality of life through symptoms relief, assisting with advance care plan and establishing goals of care.  Patient verbalized understanding of palliative care and that [name], NP will be following up with her. CODE Status: Patient affirmed she is a full code and wants everything done to keep her alive.  Goals of care: Goals of care include to maximize quality of life and symptom management. Visit consisted of counseling and education dealing with the complex and emotionally intense issues of symptom management and palliative care in the setting of serious and potentially life-threatening illness.  Patient was teary most of the visit.  She said she was laid off recently due to COVID-19 after walking with same employer for over 35 years.  Therapeutic listening and ample emotional support provided.  Palliative care team will continue to support patient, patient's family, and medical team.   Follow up: Palliative care will continue to follow patient for goals of care clarification and symptom management. [name] NP will schedule follow up visit. Symptom management: Ongoing shortness of breath managed with Oxygen 6L/Min every night and during the day as needed, Albuterol inhaler, [place]. Patient not on CPAP,denies sleep apnea, saying she has  restlesss leg syndrome. Weakness related to congestive heart failure. Patient is currently on Torsemide,  Potassium. Amphetamine for ADHD - patient is followed by Psychiatrist; takes Alprazolam, Bupropion for depression/anxiety.  Phone visit with Psychiatrist yesterday with no changes to plan of care. She has pending Rheumatologist appointment from PCP. Patient denies pain/discomfort.  I spent  One hour and 16 minutes providing this consultation; time iincludes time spent with patient/family, chart review, provider coordination,  and documentation. More than 50% of the time in this consultation was spent on coordinating communication  HISTORY OF PRESENT ILLNESS:  [name] is a 64 y.o. year old female with multiple medical problems including Diastolic CHF, ADHD, Depression. Palliative Care was asked to help address goals of care.   CODE STATUS: Full  PPS: 60% HOSPICE ELIGIBILITY/DIAGNOSIS: TBD  PAST MEDICAL HISTORY:  Past Medical History:  Diagnosis Date  . ADHD   . Chronic right-sided CHF (congestive heart failure) ([place])   . Depression   . GERD (gastroesophageal reflux disease)   . Migraine   . PAH (pulmonary artery hypertension) ([place])   . Restless leg syndrome     SOCIAL HX:  Social History   Tobacco Use  . Smoking status: Former Smoker    Packs/day: 0.50    Types: Cigarettes  . Smokeless tobacco: Never Used  Substance Use Topics  . Alcohol use: No    ALLERGIES:  Allergies  Allergen Reactions  . Asa [Aspirin] Nausea Only    abd pain  . Codeine Nausea Only  . Sulfa Antibiotics Hives     PERTINENT MEDICATIONS:  Outpatient Encounter Medications as of 12/19/2019  Medication Sig  . albuterol (PROVENTIL HFA;VENTOLIN HFA) 108 (90 Base) MCG/ACT inhaler INHALE 2 PUFFS  INTO THE LUNGS EVERY 6 HOURS AS NEEDED  . [START ON 12/23/2019] ALPRAZolam (XANAX) 0.5 MG tablet Take 1 tablet (0.5 mg total) by mouth 3 (three) times daily as needed for anxiety.  Marland Kitchen  amphetamine-dextroamphetamine (ADDERALL) 10 MG tablet Take 1 tablet (10 mg total) by mouth 3 (three) times daily.  Derrill Memo ON 01/11/2020] amphetamine-dextroamphetamine (ADDERALL) 10 MG tablet Take 1 tablet (10 mg total) by mouth 3 (three) times daily.  Derrill Memo ON 02/10/2020] amphetamine-dextroamphetamine (ADDERALL) 10 MG tablet Take 1 tablet (10 mg total) by mouth 3 (three) times daily.  Marland Kitchen atorvastatin (LIPITOR) 80 MG tablet Take 1 tablet (80 mg total) by mouth daily.  Marland Kitchen buPROPion (WELLBUTRIN XL) 300 MG 24 hr tablet TAKE 1 TABLET BY MOUTH EVERY DAY  . HYDROcodone-acetaminophen (NORCO/VICODIN) 5-325 MG tablet Take 1 tablet by mouth at bedtime. FOR CHRONIC LEG PAIN (Patient not taking: Reported on 12/04/2019)  . Magnesium Oxide 400 MG CAPS Take 1 capsule (400 mg total) by mouth daily.  . metFORMIN (GLUCOPHAGE) 500 MG tablet TAKE 1 TABLET(500 MG) BY MOUTH TWICE DAILY WITH A MEAL  . metolazone (ZAROXOLYN) 2.5 MG tablet Take 2 tablets (5 mg total) by mouth as directed. Take 5 mg on Monday and Thursday AS NEEDED ONLY for weight gain of 3 lbs or edema.  . montelukast (SINGULAIR) 10 MG tablet TAKE 1 TABLET (10 MG TOTAL) BY MOUTH DAILY. TAKE 10 MG BY MOUTH DAILY.  Marland Kitchen polyethylene glycol powder (GLYCOLAX/MIRALAX) 17 GM/SCOOP powder MIX 17 GRAMS WITH LIQUID AND TAKE  PO D PRN FOR MILD CONSTIPATION  . potassium chloride (KLOR-CON) 10 MEQ tablet Take 1 tablet (10 mEq total) by mouth daily.  Marland Kitchen torsemide (DEMADEX) 20 MG tablet Take 3 tablets (60 mg total) by mouth 2 (two) times daily.  Marland Kitchen umeclidinium-vilanterol (ANORO ELLIPTA) 62.5-25 MCG/INH AEPB INHALE 1 PUFF INTO THE LUNGS DAILY (Patient not taking: Reported on 12/04/2019)   No facility-administered encounter medications on file as of 12/19/2019.    PHYSICAL EXAM/ROS  General: NAD, teary during visit. Spouse said that is her baseline when she tells her story Cardiovascular: regular rate and rhythm; denies chest pain Pulmonary: no coughing, no shortness of  breath; on oxygen supplementation 6L/Min Abdomen: soft, nontender, + bowel sounds GU: no suprapubic tenderness Extremities: no edema, no joint deformities Skin: no rashes to exposed skin Neurological: Weakness but otherwise nonfocal  Teodoro Spray, NP

## 2019-12-21 ENCOUNTER — Other Ambulatory Visit: Payer: Self-pay | Admitting: Family Medicine

## 2020-01-10 ENCOUNTER — Ambulatory Visit: Payer: No Typology Code available for payment source | Admitting: Cardiology

## 2020-01-11 ENCOUNTER — Encounter: Payer: Self-pay | Admitting: Cardiology

## 2020-01-11 ENCOUNTER — Telehealth (INDEPENDENT_AMBULATORY_CARE_PROVIDER_SITE_OTHER): Payer: No Typology Code available for payment source | Admitting: Cardiology

## 2020-01-11 VITALS — HR 89 | Ht 67.0 in | Wt 244.0 lb

## 2020-01-11 DIAGNOSIS — E669 Obesity, unspecified: Secondary | ICD-10-CM

## 2020-01-11 DIAGNOSIS — E119 Type 2 diabetes mellitus without complications: Secondary | ICD-10-CM

## 2020-01-11 DIAGNOSIS — I50812 Chronic right heart failure: Secondary | ICD-10-CM

## 2020-01-11 DIAGNOSIS — I2721 Secondary pulmonary arterial hypertension: Secondary | ICD-10-CM | POA: Diagnosis not present

## 2020-01-11 DIAGNOSIS — E1169 Type 2 diabetes mellitus with other specified complication: Secondary | ICD-10-CM

## 2020-01-11 DIAGNOSIS — G4733 Obstructive sleep apnea (adult) (pediatric): Secondary | ICD-10-CM

## 2020-01-11 DIAGNOSIS — J9611 Chronic respiratory failure with hypoxia: Secondary | ICD-10-CM | POA: Diagnosis not present

## 2020-01-11 DIAGNOSIS — E785 Hyperlipidemia, unspecified: Secondary | ICD-10-CM

## 2020-01-11 NOTE — Patient Instructions (Signed)
Medication Instructions:  Your physician recommends that you continue on your current medications as directed. Please refer to the Current Medication list given to you today.  *If you need a refill on your cardiac medications before your next appointment, please call your pharmacy*   Lab Work: none If you have labs (blood work) drawn today and your tests are completely normal, you will receive your results only by: Marland Kitchen MyChart Message (if you have MyChart) OR . A paper copy in the mail If you have any lab test that is abnormal or we need to change your treatment, we will call you to review the results.   Testing/Procedures: none   Follow-Up: At [facility], you and your health needs are our priority.  As part of our continuing mission to provide you with exceptional heart care, we have created designated Provider Care Teams.  These Care Teams include your primary Cardiologist (physician) and Advanced Practice Providers (APPs -  Physician Assistants and Nurse Practitioners) who all work together to provide you with the care you need, when you need it.  We recommend signing up for the patient portal called "MyChart".  Sign up information is provided on this After Visit Summary.  MyChart is used to connect with patients for Virtual Visits (Telemedicine).  Patients are able to view lab/test results, encounter notes, upcoming appointments, etc.  Non-urgent messages can be sent to your provider as well.   To learn more about what you can do with MyChart, go to [email or web address].    Your next appointment:   December 7,2021 at 2:00  The format for your next appointment:   In Person  Provider:   Oswaldo Milian, MD   Other Instructions Please follow a low sodium diet   Low-Sodium Eating Plan Sodium, which is an element that makes up salt, helps you maintain a healthy balance of fluids in your body. Too much sodium can increase your blood pressure and cause fluid and waste to  be held in your body. Your health care provider or dietitian may recommend following this plan if you have high blood pressure (hypertension), kidney disease, liver disease, or heart failure. Eating less sodium can help lower your blood pressure, reduce swelling, and protect your heart, liver, and kidneys. What are tips for following this plan? General guidelines  Most people on this plan should limit their sodium intake to 1,500-2,000 mg (milligrams) of sodium each day. Reading food labels   The Nutrition Facts label lists the amount of sodium in one serving of the food. If you eat more than one serving, you must multiply the listed amount of sodium by the number of servings.  Choose foods with less than 140 mg of sodium per serving.  Avoid foods with 300 mg of sodium or more per serving. Shopping  Look for lower-sodium products, often labeled as "low-sodium" or "no salt added."  Always check the sodium content even if foods are labeled as "unsalted" or "no salt added".  Buy fresh foods. ? Avoid canned foods and premade or frozen meals. ? Avoid canned, cured, or processed meats  Buy breads that have less than 80 mg of sodium per slice. Cooking  Eat more home-cooked food and less restaurant, buffet, and fast food.  Avoid adding salt when cooking. Use salt-free seasonings or herbs instead of table salt or sea salt. Check with your health care provider or pharmacist before using salt substitutes.  Cook with plant-based oils, such as canola, sunflower, or olive oil. Meal planning  When eating at a restaurant, ask that your food be prepared with less salt or no salt, if possible.  Avoid foods that contain MSG (monosodium glutamate). MSG is sometimes added to [place] food, bouillon, and some canned foods. What foods are recommended? The items listed may not be a complete list. Talk with your dietitian about what dietary choices are best for you. Grains Low-sodium cereals, including  oats, puffed wheat and rice, and shredded wheat. Low-sodium crackers. Unsalted rice. Unsalted pasta. Low-sodium bread. Whole-grain breads and whole-grain pasta. Vegetables Fresh or frozen vegetables. "No salt added" canned vegetables. "No salt added" tomato sauce and paste. Low-sodium or reduced-sodium tomato and vegetable juice. Fruits Fresh, frozen, or canned fruit. Fruit juice. Meats and other protein foods Fresh or frozen (no salt added) meat, poultry, seafood, and fish. Low-sodium canned tuna and salmon. Unsalted nuts. Dried peas, beans, and lentils without added salt. Unsalted canned beans. Eggs. Unsalted nut butters. Dairy Milk. Soy milk. Cheese that is naturally low in sodium, such as ricotta cheese, fresh mozzarella, or Swiss cheese Low-sodium or reduced-sodium cheese. Cream cheese. Yogurt. Fats and oils Unsalted butter. Unsalted margarine with no trans fat. Vegetable oils such as canola or olive oils. Seasonings and other foods Fresh and dried herbs and spices. Salt-free seasonings. Low-sodium mustard and ketchup. Sodium-free salad dressing. Sodium-free light mayonnaise. Fresh or refrigerated horseradish. Lemon juice. Vinegar. Homemade, reduced-sodium, or low-sodium soups. Unsalted popcorn and pretzels. Low-salt or salt-free chips. What foods are not recommended? The items listed may not be a complete list. Talk with your dietitian about what dietary choices are best for you. Grains Instant hot cereals. Bread stuffing, pancake, and biscuit mixes. Croutons. Seasoned rice or pasta mixes. Noodle soup cups. Boxed or frozen macaroni and cheese. Regular salted crackers. Self-rising flour. Vegetables Sauerkraut, pickled vegetables, and relishes. Olives. [place] fries. Onion rings. Regular canned vegetables (not low-sodium or reduced-sodium). Regular canned tomato sauce and paste (not low-sodium or reduced-sodium). Regular tomato and vegetable juice (not low-sodium or reduced-sodium). Frozen  vegetables in sauces. Meats and other protein foods Meat or fish that is salted, canned, smoked, spiced, or pickled. Bacon, ham, sausage, hotdogs, corned beef, chipped beef, packaged lunch meats, salt pork, jerky, pickled herring, anchovies, regular canned tuna, sardines, salted nuts. Dairy Processed cheese and cheese spreads. Cheese curds. Blue cheese. Feta cheese. String cheese. Regular cottage cheese. Buttermilk. Canned milk. Fats and oils Salted butter. Regular margarine. Ghee. Bacon fat. Seasonings and other foods Onion salt, garlic salt, seasoned salt, table salt, and sea salt. Canned and packaged gravies. Worcestershire sauce. Tartar sauce. Barbecue sauce. Teriyaki sauce. Soy sauce, including reduced-sodium. Steak sauce. Fish sauce. Oyster sauce. Cocktail sauce. Horseradish that you find on the shelf. Regular ketchup and mustard. Meat flavorings and tenderizers. Bouillon cubes. Hot sauce and Tabasco sauce. Premade or packaged marinades. Premade or packaged taco seasonings. Relishes. Regular salad dressings. Salsa. Potato and tortilla chips. Corn chips and puffs. Salted popcorn and pretzels. Canned or dried soups. Pizza. Frozen entrees and pot pies. Summary  Eating less sodium can help lower your blood pressure, reduce swelling, and protect your heart, liver, and kidneys.  Most people on this plan should limit their sodium intake to 1,500-2,000 mg (milligrams) of sodium each day.  Canned, boxed, and frozen foods are high in sodium. Restaurant foods, fast foods, and pizza are also very high in sodium. You also get sodium by adding salt to food.  Try to cook at home, eat more fresh fruits and vegetables, and eat less fast food, canned,  processed, or prepared foods. This information is not intended to replace advice given to you by your health care provider. Make sure you discuss any questions you have with your health care provider. Document Revised: 04/08/2017 Document Reviewed:  04/19/2016 Elsevier Patient Education  2020 [employer].

## 2020-01-11 NOTE — Progress Notes (Signed)
Virtual Visit via Telephone Note   This visit type was conducted due to national recommendations for restrictions regarding the COVID-19 Pandemic (e.g. social distancing) in an effort to limit this patient's exposure and mitigate transmission in our community.  Due to her co-morbid illnesses, this patient is at least at moderate risk for complications without adequate follow up.  This format is felt to be most appropriate for this patient at this time.  The patient did not have access to video technology/had technical difficulties with video requiring transitioning to audio format only (telephone).  All issues noted in this document were discussed and addressed.  No physical exam could be performed with this format.  Please refer to the patient's chart for her  consent to telehealth for [facility].    Date:  01/11/2020   ID:  [name], DOB 12/31/1955, MRN [id number] The patient was identified using 2 identifiers.  Patient Location: Home Provider Location: Home Office  PCP:  Orma Flaming, MD  Cardiologist:  Donato Heinz, MD  Electrophysiologist:  None   Evaluation Performed:  Follow-Up Visit  Chief Complaint:  No new complaints  History of Present Illness:    [name] is a pleasant 64 y.o. female with a history of chronic right heart failure and pulmonary hypertension.  She was seen by Dr. Nechama Guard December 04, 2019.  At that time her office weight was 244 pounds.  She was stable from a heart failure standpoint.  She takes her diuretics as prescribed and metolazone as needed.  She has had a prior right heart catheterization and echocardiogram in 2019.  She uses oxygen at night.  We had recommended follow-up with Dr. Elsworth Soho her pulmonologist but she tells me that she has not been able to arrange this yet.  Since we saw her last her weight is remained the same.  She is only had to take the metolazone once.  She does have chronic shortness of breath.  She tells me currently  her allergies are bothering her.  She has had trouble affording some of her medications secondary to financial problems.  The patient does not have symptoms concerning for COVID-19 infection (fever, chills, cough, or new shortness of breath).    Past Medical History:  Diagnosis Date  . ADHD   . Chronic right-sided CHF (congestive heart failure) ([place])   . Depression   . GERD (gastroesophageal reflux disease)   . Migraine   . PAH (pulmonary artery hypertension) ([place])   . Restless leg syndrome    Past Surgical History:  Procedure Laterality Date  . CESAREAN SECTION    . CHOLECYSTECTOMY    . RIGHT HEART CATH N/A 04/25/2018   Procedure: RIGHT HEART CATH;  Surgeon: Jolaine Artist, MD;  Location: [place] [facility];  Service: Cardiovascular;  Laterality: N/A;  . TONSILLECTOMY       Current Meds  Medication Sig  . albuterol (PROVENTIL HFA;VENTOLIN HFA) 108 (90 Base) MCG/ACT inhaler INHALE 2 PUFFS INTO THE LUNGS EVERY 6 HOURS AS NEEDED  . ALPRAZolam (XANAX) 0.5 MG tablet Take 1 tablet (0.5 mg total) by mouth 3 (three) times daily as needed for anxiety.  Marland Kitchen amphetamine-dextroamphetamine (ADDERALL) 10 MG tablet Take 1 tablet (10 mg total) by mouth 3 (three) times daily.  Marland Kitchen atorvastatin (LIPITOR) 80 MG tablet Take 1 tablet (80 mg total) by mouth daily.  Marland Kitchen buPROPion (WELLBUTRIN XL) 300 MG 24 hr tablet TAKE 1 TABLET BY MOUTH EVERY DAY  . HYDROcodone-acetaminophen (NORCO/VICODIN) 5-325  MG tablet Take 1 tablet by mouth at bedtime. FOR CHRONIC LEG PAIN  . Magnesium Oxide 400 MG CAPS Take 1 capsule (400 mg total) by mouth daily.  . metFORMIN (GLUCOPHAGE) 500 MG tablet TAKE 1 TABLET(500 MG) BY MOUTH TWICE DAILY WITH A MEAL  . metolazone (ZAROXOLYN) 2.5 MG tablet Take 2 tablets (5 mg total) by mouth as directed. Take 5 mg on Monday and Thursday AS NEEDED ONLY for weight gain of 3 lbs or edema.  . montelukast (SINGULAIR) 10 MG tablet TAKE 1 TABLET (10 MG TOTAL) BY MOUTH DAILY. TAKE 10 MG BY MOUTH  DAILY.  Marland Kitchen polyethylene glycol powder (GLYCOLAX/MIRALAX) 17 GM/SCOOP powder MIX 17 GRAMS WITH LIQUID AND TAKE  PO D PRN FOR MILD CONSTIPATION  . potassium chloride (KLOR-CON) 10 MEQ tablet Take 1 tablet (10 mEq total) by mouth daily.  Marland Kitchen torsemide (DEMADEX) 20 MG tablet Take 3 tablets (60 mg total) by mouth 2 (two) times daily.     Allergies:   Asa [aspirin], Codeine, and Sulfa antibiotics   Social History   Tobacco Use  . Smoking status: Former Smoker    Packs/day: 0.50    Types: Cigarettes  . Smokeless tobacco: Never Used  Vaping Use  . Vaping Use: Never used  Substance Use Topics  . Alcohol use: No  . Drug use: No     Family Hx: The patient's family history includes Alcohol abuse in her sister; Drug abuse in her sister; Early death in her sister; Emphysema in her father; Mental illness in her sister; Pancreatic cancer in her mother.  ROS:   Please see the history of present illness.    All other systems reviewed and are negative.   Prior CV studies:   The following studies were reviewed today: Echo 04/23/2018- Study Conclusions   - Left ventricle: The cavity size was normal. Wall thickness was  increased in a pattern of mild LVH. Systolic function was normal.  The estimated ejection fraction was in the range of 60% to 65%.  Wall motion was normal; there were no regional wall motion  abnormalities. Doppler parameters are consistent with abnormal  left ventricular relaxation (grade 1 diastolic dysfunction).  - Ventricular septum: The contour showed diastolic flattening.  - Mitral valve: Moderately calcified annulus. Valve area by  continuity equation (using LVOT flow): 2.16 cm^2.  - Right ventricle: The cavity size was mildly dilated. Wall  thickness was normal.  - Pulmonary arteries: Systolic pressure was moderately increased.  PA peak pressure: 58 mm Hg (S).   Rt Heart cath 04/25/2018- Assessment: 1. Moderate PAH 2. Mildly elevated PCWP 3. High  output by Thermo and normal by Fick with PVR range 2.7-4.1 WU 4. No evidence of intracardiac shunting  Plan/Discussion:  She has mixed PAH suspect combination of WHO group II (diatolic HF), III (hypoxic lung disease) and high output likely due to peripheral shunting in setting of lung disease/obesity.   Not currently a candidate for selective pulmonary vasodilators. Need to manage volume status closely.   Recommend weight loss and referral to Pulmonary rehab. Continue O2 support.  F/u sleep study and PFTs. Can f/u in [facility] clinic as needed.     Labs/Other Tests and Data Reviewed:    EKG:  An ECG dated 10/15/2019 was personally reviewed today and demonstrated:  NSR, NSST changes  Recent Labs: 09/27/2019: ALT 19; Hemoglobin 14.6; Platelets 266; TSH 1.57 10/15/2019: BUN 16; Creatinine, Ser 0.79; Magnesium 1.5; Potassium 3.8; Sodium 140   Recent Lipid Panel Lab Results  Component Value Date/Time   CHOL 207 (H) 09/27/2019 02:26 PM   TRIG 152.0 (H) 09/27/2019 02:26 PM   HDL 42.50 09/27/2019 02:26 PM   CHOLHDL 5 09/27/2019 02:26 PM   LDLCALC 134 (H) 09/27/2019 02:26 PM   LDLDIRECT 149.0 09/04/2018 10:11 AM    Wt Readings from Last 3 Encounters:  01/11/20 244 lb (110.7 kg)  12/04/19 (!) 244 lb 12.8 oz (111 kg)  01/08/19 229 lb (103.9 kg)     Objective:    Vital Signs:  Pulse 89   Ht 5\' 7"  (1.702 m)   Wt 244 lb (110.7 kg)   SpO2 96%   BMI 38.22 kg/m    VITAL SIGNS:  reviewed  ASSESSMENT & PLAN:    Chronic Rt heart failure- Stable based on her history- weight unchanged.  Moderate pulmonary HTN- Mixed pulmonary HTN with diastolic dysfunction, COPD, and obesity.  Chronic respiratory failure- On O2 at night  NIDDM- PCP follows-on Glucophage  HLD- Statin rx increased June 2021 for LDL 134  Obesity- BMI 38- weight unchanged from 7/27 OV  Plan:  No change in current Rx.  She has been advised to limit her sodium intake.   COVID-19 Education: The signs and symptoms  of COVID-19 were discussed with the patient and how to seek care for testing (follow up with PCP or arrange E-visit).  The importance of social distancing was discussed today.  Time:   Today, I have spent 15 minutes with the patient with telehealth technology discussing the above problems.     Medication Adjustments/Labs and Tests Ordered: Current medicines are reviewed at length with the patient today.  Concerns regarding medicines are outlined above.   Tests Ordered: No orders of the defined types were placed in this encounter.   Medication Changes: No orders of the defined types were placed in this encounter.   Follow Up:  In Person Dr Gardiner Rhyme in 3 months  Signed, Kerin Ransom, Hershal Coria  01/11/2020 10:26 AM    [place]

## 2020-01-25 ENCOUNTER — Telehealth: Payer: Self-pay | Admitting: Pulmonary Disease

## 2020-01-25 NOTE — Telephone Encounter (Signed)
Dr Elsworth Soho are you okay with the pt switching to another doctor here for her [place]?

## 2020-01-27 NOTE — Telephone Encounter (Signed)
OK with me.

## 2020-01-28 NOTE — Telephone Encounter (Signed)
Spoke with the pt  I advised can schedule appt with Dr Silas Flood  She did not wish to schedule right now and states will have to call back later

## 2020-02-04 ENCOUNTER — Telehealth: Payer: Self-pay

## 2020-02-04 NOTE — Telephone Encounter (Signed)
Phone call placed to patient to offer to schedule a follow up visit with Enid Derry NP. Scheduled for 02/15/20 between 1:00pm and 1:30pm.

## 2020-02-09 ENCOUNTER — Other Ambulatory Visit: Payer: Self-pay | Admitting: Family Medicine

## 2020-02-15 ENCOUNTER — Other Ambulatory Visit: Payer: No Typology Code available for payment source | Admitting: Internal Medicine

## 2020-02-15 ENCOUNTER — Other Ambulatory Visit: Payer: Self-pay

## 2020-02-15 DIAGNOSIS — F411 Generalized anxiety disorder: Secondary | ICD-10-CM

## 2020-02-15 DIAGNOSIS — Z515 Encounter for palliative care: Secondary | ICD-10-CM

## 2020-02-15 DIAGNOSIS — M2550 Pain in unspecified joint: Secondary | ICD-10-CM

## 2020-02-15 DIAGNOSIS — J9611 Chronic respiratory failure with hypoxia: Secondary | ICD-10-CM

## 2020-02-15 NOTE — Progress Notes (Signed)
[facility] Consult Note Telephone: [phone]  Fax: [phone]  PATIENT NAME: [name] DOB: [birth year] MRN: [id number]  PRIMARY CARE PROVIDER:   Orma Flaming, MD  REFERRING PROVIDER:  Orma Flaming, [place] [place] [address] [place],  [place] [address]  RESPONSIBLE PARTY:   Self  RECOMMENDATIONS and PLAN:  Palliative care encounter  Z51.5  1.  Advance Care Planning:  Previous designations of full code and full scope of treatment.  Her primary goal is to improve chronic pain and manage respiratory/cardiac health.  Palliative care will follow-up with patient in aprox 6 weeks.    2.  Multiple joint pain:   She would like to possibly have evaluation of multiple joint pain/alterations in mobility as recommened by PCP. Agree with referral to Rheumatology. Schedule Tylenol 1000mg  morning and evening.  Continue Tramadol prn.  Consider home visiting providers.  3.  Chronic respiratory failure with hypoxia: Increase use of oxygen with exertion to decrease episodes of hypoxia.  (Maintain O2 sats at 90% or greater).  Follow-up with pulmonologist as instructed.   4.  Generalized anxiety disorder:  Continue f/u with psychiatrist.  Refill and restart rx of Wellbutrin XL 300mg  daily as previously prescribed. (Pt. Was unaware that she was out of this RX.)                                                                                                                                                                                                                                                                                           I spent 45 minutes providing this consultation,  from 1300 to 1345. More than 50% of the time in this consultation was spent coordinating communication.   HISTORY OF PRESENT ILLNESS:  Follow-up with [name] is a 64 y.o. year old female with multiple medical problems including pulmonary hypertesion, heart failure, T2DM.  She complains of extreme fatigue with ambulation and ongoing pain and stiffness of multiple joints inclusive of her R hand and feet.  Reports that she normally does not use oxygen when she is mobile in or away from home. She  periodically uses Tramadol and Tylenol for treatment of chronic pain (Reports little improvement with use of same). Current pain level is 8/10. Combination of chronic pain and shortness of breath limits pt's mobility and ability to leave home foe appointments. Palliative Care was asked to help address goals of care.   CODE STATUS: FULL CODE  PPS:40% weak HOSPICE ELIGIBILITY/DIAGNOSIS: TBD  PAST MEDICAL HISTORY:  Past Medical History:  Diagnosis Date  . ADHD   . Chronic right-sided CHF (congestive heart failure) ([place])   . Depression   . GERD (gastroesophageal reflux disease)   . Migraine   . PAH (pulmonary artery hypertension) ([place])   . Restless leg syndrome     SOCIAL HX:  Social History   Tobacco Use  . Smoking status: Former Smoker    Packs/day: 0.50    Types: Cigarettes  . Smokeless tobacco: Never Used  Substance Use Topics  . Alcohol use: No    ALLERGIES:  Allergies  Allergen Reactions  . Asa [Aspirin] Nausea Only    abd pain  . Codeine Nausea Only  . Sulfa Antibiotics Hives     PERTINENT MEDICATIONS:  Outpatient Encounter Medications as of 02/15/2020  Medication Sig  . albuterol (PROVENTIL HFA;VENTOLIN HFA) 108 (90 Base) MCG/ACT inhaler INHALE 2 PUFFS INTO THE LUNGS EVERY 6 HOURS AS NEEDED  . ALPRAZolam (XANAX) 0.5 MG tablet Take 1 tablet (0.5 mg total) by mouth 3 (three) times daily as needed for anxiety.  Marland Kitchen amphetamine-dextroamphetamine (ADDERALL) 10 MG tablet Take 1 tablet (10 mg total) by mouth 3 (three) times daily.  Marland Kitchen atorvastatin (LIPITOR) 80 MG tablet Take 1 tablet (80 mg total) by mouth daily.  Marland Kitchen buPROPion (WELLBUTRIN XL) 300 MG 24 hr tablet TAKE 1 TABLET BY MOUTH EVERY DAY  . HYDROcodone-acetaminophen (NORCO/VICODIN) 5-325 MG tablet  Take 1 tablet by mouth at bedtime. FOR CHRONIC LEG PAIN  . Magnesium Oxide 400 MG CAPS Take 1 capsule (400 mg total) by mouth daily.  . metFORMIN (GLUCOPHAGE) 500 MG tablet TAKE 1 TABLET(500 MG) BY MOUTH TWICE DAILY WITH A MEAL  . metolazone (ZAROXOLYN) 2.5 MG tablet Take 2 tablets (5 mg total) by mouth as directed. Take 5 mg on Monday and Thursday AS NEEDED ONLY for weight gain of 3 lbs or edema.  . montelukast (SINGULAIR) 10 MG tablet TAKE 1 TABLET (10 MG TOTAL) BY MOUTH DAILY.  Marland Kitchen polyethylene glycol powder (GLYCOLAX/MIRALAX) 17 GM/SCOOP powder MIX 17 GRAMS WITH LIQUID AND TAKE  PO D PRN FOR MILD CONSTIPATION  . potassium chloride (KLOR-CON) 10 MEQ tablet Take 1 tablet (10 mEq total) by mouth daily.  Marland Kitchen torsemide (DEMADEX) 20 MG tablet Take 3 tablets (60 mg total) by mouth 2 (two) times daily.  Marland Kitchen umeclidinium-vilanterol (ANORO ELLIPTA) 62.5-25 MCG/INH AEPB INHALE 1 PUFF INTO THE LUNGS DAILY (Patient not taking: Reported on 12/04/2019)   No facility-administered encounter medications on file as of 02/15/2020.    PHYSICAL EXAM:   General: NAD, chronically ill appearing,obese female Cardiovascular: regular rate and rhythm Pulmonary: clear in all fields.  O2 sat 85% immediately after ambulation and increased to 92% with rest   (on room air) Abdomen: soft, nontender, + bowel sounds Extremities: edema  And increased warmth R hand PIP joints, trace edema BLE Skin: exposed skin is intact Neurological: A&O x3.  Weakness but otherwise nonfocal Psych:  Intermittently tearful. Redirects easily  [name], NP-C

## 2020-03-05 ENCOUNTER — Other Ambulatory Visit: Payer: Self-pay | Admitting: Family Medicine

## 2020-03-09 ENCOUNTER — Other Ambulatory Visit: Payer: Self-pay | Admitting: Family Medicine

## 2020-03-09 DIAGNOSIS — E1159 Type 2 diabetes mellitus with other circulatory complications: Secondary | ICD-10-CM

## 2020-03-10 NOTE — Telephone Encounter (Signed)
[name] patient

## 2020-03-14 ENCOUNTER — Telehealth (INDEPENDENT_AMBULATORY_CARE_PROVIDER_SITE_OTHER): Payer: Self-pay | Admitting: Psychiatry

## 2020-03-14 ENCOUNTER — Other Ambulatory Visit: Payer: Self-pay

## 2020-03-14 DIAGNOSIS — F909 Attention-deficit hyperactivity disorder, unspecified type: Secondary | ICD-10-CM

## 2020-03-14 DIAGNOSIS — F341 Dysthymic disorder: Secondary | ICD-10-CM

## 2020-03-14 MED ORDER — AMPHETAMINE-DEXTROAMPHETAMINE 10 MG PO TABS: ORAL_TABLET | ORAL | 0 refills | Status: DC

## 2020-03-14 MED ORDER — TRAZODONE HCL 50 MG PO TABS: 50.0000 mg | ORAL_TABLET | Freq: Every evening | ORAL | 2 refills | Status: DC | PRN

## 2020-03-14 MED ORDER — ALPRAZOLAM 0.5 MG PO TABS
0.5000 mg | ORAL_TABLET | Freq: Three times a day (TID) | ORAL | 2 refills | Status: DC | PRN
Start: 2020-03-22 — End: 2020-06-12

## 2020-03-14 MED ORDER — BUPROPION HCL ER (XL) 300 MG PO TB24
300.0000 mg | ORAL_TABLET | Freq: Every day | ORAL | 1 refills | Status: DC
Start: 2020-03-14 — End: 2020-06-12

## 2020-03-14 NOTE — Progress Notes (Signed)
[facility] MD/PA/NP OP Progress Note  03/14/2020 9:17 AM [name]  MRN:  [id number] Interview was conducted by phone and I verified that I was speaking with the correct person using two identifiers. I discussed the limitations of evaluation and management by telemedicine and  the availability of in person appointments. Patient expressed understanding and agreed to proceed. Patient location - home; physician - home office.  Chief Complaint: Chronic pain, insomnia, fatigue.  HPI: 64yo married female with long standing problems with anxiety and depression. She also has a hx of ADD. Her PCP wanted her to be followed by mental health and refused to prescribe Adderall which patient has been on for some time. She is chronically depressed and anxious due to multiple medical issues with  Chronic pain (multiple sites) being dominant. No SI now or ever. No hx of mania,psychosis, no alcohol/drug abuse.Shewas onLexapro 20 mg daily, Adderall 20 mg in AM and 10 mg at noon. Addition ofAbilify (2 mg) to augment Lexaprodid not help and she noticed increased appetite even after she stopped it.In thepast she tried several SSRIs/SNRIs as well as Trintellix with questionable benefit. She was also on bupropion but could not recall if it was helpful and given her chronic fatigue we decided to try it again. She started on Wellbutrin XL 150 mg andthen increased it to 300 mg. Shetolerates it welland her mood has improved some. She lost her insurance in middle of last year.She had to stop taking Adderall as it is not covered by her new insurancebut now it has been restarted. We have restarted Wellbutrin and alprazolamand she finds both medications helpful.Multiple medical problems (CHF, DM, OSA, emphysema, right hand and shoulder pain) as well as financial stress contribute to chronic low grade depression. She cannot sleep well and feels tired despite taking Adderall.    Visit Diagnosis:    ICD-10-CM   1. Adult  ADHD  F90.9   2. Dysthymic disorder  F34.1     Past Psychiatric History: Please see intake H&P.  Past Medical History:  Past Medical History:  Diagnosis Date  . ADHD   . Chronic right-sided CHF (congestive heart failure) ([facility])   . Depression   . GERD (gastroesophageal reflux disease)   . Migraine   . PAH (pulmonary artery hypertension) ([facility])   . Restless leg syndrome     Past Surgical History:  Procedure Laterality Date  . CESAREAN SECTION    . CHOLECYSTECTOMY    . RIGHT HEART CATH N/A 04/25/2018   Procedure: RIGHT HEART CATH;  Surgeon: Jolaine Artist, MD;  Location: [facility];  Service: Cardiovascular;  Laterality: N/A;  . TONSILLECTOMY      Family Psychiatric History: Reviewed.  Family History:  Family History  Problem Relation Age of Onset  . Alcohol abuse Sister   . Drug abuse Sister   . Mental illness Sister   . Early death Sister   . Pancreatic cancer Mother   . Emphysema Father     Social History:  Social History   Socioeconomic History  . Marital status: Married    Spouse name: Statistician  . Number of children: 1  . Years of education: Not on file  . Highest education level: Some college, no degree  Occupational History    Employer: [employer]  Tobacco Use  . Smoking status: Former Smoker    Packs/day: 0.50    Types: Cigarettes  . Smokeless tobacco: Never Used  Vaping Use  . Vaping Use: Never used  Substance and Sexual Activity  . Alcohol use: No  . Drug use: No  . Sexual activity: Not Currently  Other Topics Concern  . Not on file  Social History Narrative  . Not on file   Social Determinants of Health   Financial Resource Strain:   . Difficulty of Paying Living Expenses: Not on file  Food Insecurity:   . Worried About Charity fundraiser in the Last Year: Not on file  . Ran Out of Food in the Last Year: Not on file  Transportation Needs:   . Lack of Transportation (Medical): Not on file  . Lack of Transportation  (Non-Medical): Not on file  Physical Activity:   . Days of Exercise per Week: Not on file  . Minutes of Exercise per Session: Not on file  Stress:   . Feeling of Stress : Not on file  Social Connections:   . Frequency of Communication with Friends and Family: Not on file  . Frequency of Social Gatherings with Friends and Family: Not on file  . Attends Religious Services: Not on file  . Active Member of Clubs or Organizations: Not on file  . Attends Archivist Meetings: Not on file  . Marital Status: Not on file    Allergies:  Allergies  Allergen Reactions  . Asa [Aspirin] Nausea Only    abd pain  . Codeine Nausea Only  . Sulfa Antibiotics Hives    Metabolic Disorder Labs: Lab Results  Component Value Date   HGBA1C 6.7 (H) 09/27/2019   MPG 148.46 04/25/2018   No results found for: PROLACTIN Lab Results  Component Value Date   CHOL 207 (H) 09/27/2019   TRIG 152.0 (H) 09/27/2019   HDL 42.50 09/27/2019   CHOLHDL 5 09/27/2019   VLDL 30.4 09/27/2019   LDLCALC 134 (H) 09/27/2019   Lab Results  Component Value Date   TSH 1.57 09/27/2019   TSH 2.84 09/04/2018    Therapeutic Level Labs: No results found for: LITHIUM No results found for: VALPROATE No components found for:  CBMZ  Current Medications: Current Outpatient Medications  Medication Sig Dispense Refill  . albuterol (PROVENTIL HFA;VENTOLIN HFA) 108 (90 Base) MCG/ACT inhaler INHALE 2 PUFFS INTO THE LUNGS EVERY 6 HOURS AS NEEDED    . [START ON 03/22/2020] ALPRAZolam (XANAX) 0.5 MG tablet Take 1 tablet (0.5 mg total) by mouth 3 (three) times daily as needed for anxiety. 90 tablet 2  . amphetamine-dextroamphetamine (ADDERALL) 10 MG tablet Take 1 tablet (10 mg total) by mouth 3 (three) times daily. 90 tablet 0  . amphetamine-dextroamphetamine (ADDERALL) 10 MG tablet Take two tablets in AM and one at noon. 90 tablet 0  . [START ON 04/13/2020] amphetamine-dextroamphetamine (ADDERALL) 10 MG tablet Take  2  tablets in AM and one at noon 90 tablet 0  . [START ON 05/13/2020] amphetamine-dextroamphetamine (ADDERALL) 10 MG tablet Take  2 tablets in AM and one at noon 90 tablet 0  . atorvastatin (LIPITOR) 80 MG tablet Take 1 tablet (80 mg total) by mouth daily. 90 tablet 3  . buPROPion (WELLBUTRIN XL) 300 MG 24 hr tablet Take 1 tablet (300 mg total) by mouth daily. 90 tablet 1  . HYDROcodone-acetaminophen (NORCO/VICODIN) 5-325 MG tablet Take 1 tablet by mouth at bedtime. FOR CHRONIC LEG PAIN 30 tablet 0  . Magnesium Oxide 400 MG CAPS Take 1 capsule (400 mg total) by mouth daily. 30 capsule 0  . metFORMIN (GLUCOPHAGE) 500 MG tablet TAKE 1 TABLET(500 MG) BY  MOUTH TWICE DAILY WITH A MEAL 180 tablet 0  . metolazone (ZAROXOLYN) 2.5 MG tablet Take 2 tablets (5 mg total) by mouth as directed. Take 5 mg on Monday and Thursday AS NEEDED ONLY for weight gain of 3 lbs or edema. 20 tablet 0  . montelukast (SINGULAIR) 10 MG tablet TAKE 1 TABLET BY MOUTH EVERY DAY 90 tablet 1  . polyethylene glycol powder (GLYCOLAX/MIRALAX) 17 GM/SCOOP powder MIX 17 GRAMS WITH LIQUID AND TAKE  PO D PRN FOR MILD CONSTIPATION    . potassium chloride (KLOR-CON) 10 MEQ tablet Take 1 tablet (10 mEq total) by mouth daily. 90 tablet 1  . torsemide (DEMADEX) 20 MG tablet Take 3 tablets (60 mg total) by mouth 2 (two) times daily. 180 tablet 11  . traZODone (DESYREL) 50 MG tablet Take 1 tablet (50 mg total) by mouth at bedtime and may repeat dose one time if needed. 60 tablet 2  . umeclidinium-vilanterol (ANORO ELLIPTA) 62.5-25 MCG/INH AEPB INHALE 1 PUFF INTO THE LUNGS DAILY (Patient not taking: Reported on 12/04/2019) 180 each 3   No current facility-administered medications for this visit.      Psychiatric Specialty Exam: Review of Systems  Constitutional: Positive for fatigue.  Respiratory: Positive for chest tightness and shortness of breath.   Musculoskeletal: Positive for arthralgias, back pain and myalgias.  Psychiatric/Behavioral:  Positive for sleep disturbance. The patient is nervous/anxious.   All other systems reviewed and are negative.   There were no vitals taken for this visit.There is no height or weight on file to calculate BMI.  General Appearance: NA  Eye Contact:  NA  Speech:  Clear and Coherent and Slow  Volume:  Normal  Mood:  Anxious and Depressed  Affect:  NA  Thought Process:  Goal Directed  Orientation:  Full (Time, Place, and Person)  Thought Content: Rumination   Suicidal Thoughts:  No  Homicidal Thoughts:  No  Memory:  Immediate;   Good Recent;   Good Remote;   Good  Judgement:  Good  Insight:  Fair  Psychomotor Activity:  NA  Concentration:  Concentration: Good  Recall:  Good  Fund of Knowledge: Good  Language: Good  Akathisia:  Negative  Handed:  Right  AIMS (if indicated): not done  Assets:  Communication Skills Desire for Improvement Housing  ADL's:  Intact  Cognition: WNL  Sleep:  Poor   Screenings: GAD-7     Office Visit from 09/04/2018 in [facility]  Total GAD-7 Score 7    PHQ2-9     Office Visit from 01/08/2019 in [facility] Visit from 09/04/2018 in [facility]  PHQ-2 Total Score 6 6  PHQ-9 Total Score 12 14       Assessment and Plan: 64yo married female with long standing problems with anxiety and depression. She also has a hx of ADD. Her PCP wanted her to be followed by mental health and refused to prescribe Adderall which patient has been on for some time. She is chronically depressed and anxious due to multiple medical issues with  Chronic pain (multiple sites) being dominant. No SI now or ever. No hx of mania,psychosis, no alcohol/drug abuse.Shewas onLexapro 20 mg daily, Adderall 20 mg in AM and 10 mg at noon. Addition ofAbilify (2 mg) to augment Lexaprodid not help and she noticed increased appetite even after she stopped it.In thepast she tried several SSRIs/SNRIs as well as  Trintellix with questionable benefit. She was also on bupropion but  could not recall if it was helpful and given her chronic fatigue we decided to try it again. She started on Wellbutrin XL 150 mg andthen increased it to 300 mg. Shetolerates it welland her mood has improved some. She lost her insurance in middle of last year.She had to stop taking Adderall as it is not covered by her new insurancebut now it has been restarted. We have restarted Wellbutrin and alprazolamand she finds both medications helpful.Multiple medical problems (CHF, DM, OSA, emphysema, right hand and shoulder pain) as well as financial stress contribute to chronic low grade depression. She cannot sleep well and feels tired despite taking Adderall.  Dx: Dysthymic disorder; ADD adult type  Plan:ContinueWellbutrin XL to 300 mg, alprazolam 0.5 mg prn anxiety and Adderall20 mg in AM and 10 mg at noon.We will try trazodone 50-100 mg for insomnia but if it is  Triggered by pain issues it is not likely to be very helpful. Next visit in3 months.The plan was discussed with patient who had an opportunity to ask questions and these were all answered. I spend73minutes in phone consultation with the patient.    Stephanie Acre, MD 03/14/2020, 9:17 AM

## 2020-04-05 ENCOUNTER — Other Ambulatory Visit (HOSPITAL_COMMUNITY): Payer: Self-pay | Admitting: Psychiatry

## 2020-04-13 ENCOUNTER — Other Ambulatory Visit: Payer: Self-pay | Admitting: Family Medicine

## 2020-04-13 DIAGNOSIS — I50812 Chronic right heart failure: Secondary | ICD-10-CM

## 2020-04-15 ENCOUNTER — Ambulatory Visit: Payer: No Typology Code available for payment source | Admitting: Cardiology

## 2020-05-06 ENCOUNTER — Other Ambulatory Visit: Payer: Self-pay | Admitting: Family Medicine

## 2020-05-06 DIAGNOSIS — E1159 Type 2 diabetes mellitus with other circulatory complications: Secondary | ICD-10-CM

## 2020-06-12 ENCOUNTER — Telehealth (HOSPITAL_COMMUNITY): Payer: Self-pay | Admitting: *Deleted

## 2020-06-12 ENCOUNTER — Telehealth (INDEPENDENT_AMBULATORY_CARE_PROVIDER_SITE_OTHER): Payer: Self-pay | Admitting: Psychiatry

## 2020-06-12 ENCOUNTER — Other Ambulatory Visit: Payer: Self-pay

## 2020-06-12 ENCOUNTER — Other Ambulatory Visit (HOSPITAL_COMMUNITY): Payer: Self-pay | Admitting: Psychiatry

## 2020-06-12 DIAGNOSIS — F909 Attention-deficit hyperactivity disorder, unspecified type: Secondary | ICD-10-CM

## 2020-06-12 DIAGNOSIS — F341 Dysthymic disorder: Secondary | ICD-10-CM

## 2020-06-12 DIAGNOSIS — F411 Generalized anxiety disorder: Secondary | ICD-10-CM

## 2020-06-12 MED ORDER — BUPROPION HCL ER (XL) 300 MG PO TB24: 300.0000 mg | ORAL_TABLET | Freq: Every day | ORAL | 1 refills | Status: DC

## 2020-06-12 MED ORDER — TRAZODONE HCL 50 MG PO TABS
ORAL_TABLET | ORAL | 1 refills | Status: DC
Start: 2020-06-12 — End: 2021-07-29

## 2020-06-12 MED ORDER — ALPRAZOLAM 0.5 MG PO TABS: 0.5000 mg | ORAL_TABLET | Freq: Two times a day (BID) | ORAL | 2 refills | Status: AC | PRN

## 2020-06-12 MED ORDER — AMPHETAMINE-DEXTROAMPHETAMINE 10 MG PO TABS: ORAL_TABLET | ORAL | 0 refills | Status: DC

## 2020-06-12 MED ORDER — AMPHETAMINE-DEXTROAMPHETAMINE 10 MG PO TABS: ORAL_TABLET | ORAL | 0 refills | Status: AC

## 2020-06-12 MED ORDER — AMPHETAMINE-DEXTROAMPHETAMINE 10 MG PO TABS
ORAL_TABLET | ORAL | 0 refills | Status: DC
Start: 2020-06-12 — End: 2021-07-29

## 2020-06-12 NOTE — Telephone Encounter (Signed)
Done - my brain is clearly not functioning well enough.

## 2020-06-12 NOTE — Progress Notes (Signed)
[facility] MD/PA/NP OP Progress Note  06/12/2020 11:50 AM LALE LAGRASSA  MRN:  [id number] Interview was conducted by phone and I verified that I was speaking with the correct person using two identifiers. I discussed the limitations of evaluation and management by telemedicine and  the availability of in person appointments. Patient expressed understanding and agreed to proceed. Participants in the visit: patient (location - home); physician (location - home office).  Chief Complaint: Anxiety, fatigue.  HPI: 65yo married female with long standing problems with anxiety and depression. She also has a hx of ADD. Her PCP wanted her to be followed by mental health and refused to prescribe Adderall which patient has been on for some time. She is chronically depressed and anxious due to multiple medical issues with  Chronic pain (multiple sites) being dominant. No SI now or ever. No hx of mania,psychosis, no alcohol/drug abuse.Shewas onLexapro 20 mg daily, Adderall 20 mg in AM and 10 mg at noon. Addition ofAbilify (2 mg) to augment Lexaprodid not help and she noticed increased appetite even after she stopped it.In thepast she tried several SSRIs/SNRIs as well as Trintellix with questionable benefit. She was also on bupropion but could not recall if it was helpful and given her chronic fatigue we decided to try it again. She started on Wellbutrin XL 150 mg andthen increased it to 300 mg. Shetolerates it welland her mood has improved some. She lost her insurance in middle of last year.She had to stop taking Adderall as it is not covered by her new insurancebut has since been restarted. We have restarted Wellbutrin and alprazolamand she finds both medications helpful.Multiple medical problems (CHF, DM, OSA, emphysema, right hand and shoulder pain) as well as financial stress contribute to chronic low grade depression. She cannot sleep well and feels tired despite taking Adderall. WE have added trazodone 50 mg  and while it helps with sleep she feels residual sedation in AM.   Visit Diagnosis:    ICD-10-CM   1. Adult ADHD  F90.9   2. GAD (generalized anxiety disorder)  F41.1   3. Dysthymic disorder  F34.1     Past Psychiatric History: Please see intake H&P.  Past Medical History:  Past Medical History:  Diagnosis Date  . ADHD   . Chronic right-sided CHF (congestive heart failure) ([facility])   . Depression   . GERD (gastroesophageal reflux disease)   . Migraine   . PAH (pulmonary artery hypertension) ([facility])   . Restless leg syndrome     Past Surgical History:  Procedure Laterality Date  . CESAREAN SECTION    . CHOLECYSTECTOMY    . RIGHT HEART CATH N/A 04/25/2018   Procedure: RIGHT HEART CATH;  Surgeon: Dolores Patty, MD;  Location: [facility];  Service: Cardiovascular;  Laterality: N/A;  . TONSILLECTOMY      Family Psychiatric History: Reviewed.  Family History:  Family History  Problem Relation Age of Onset  . Alcohol abuse Sister   . Drug abuse Sister   . Mental illness Sister   . Early death Sister   . Pancreatic cancer Mother   . Emphysema Father     Social History:  Social History   Socioeconomic History  . Marital status: Married    Spouse name: Leisure centre manager  . Number of children: 1  . Years of education: Not on file  . Highest education level: Some college, no degree  Occupational History    Employer: [employer]  Tobacco Use  . Smoking  status: Former Smoker    Packs/day: 0.50    Types: Cigarettes  . Smokeless tobacco: Never Used  Vaping Use  . Vaping Use: Never used  Substance and Sexual Activity  . Alcohol use: No  . Drug use: No  . Sexual activity: Not Currently  Other Topics Concern  . Not on file  Social History Narrative  . Not on file   Social Determinants of Health   Financial Resource Strain: Not on file  Food Insecurity: Not on file  Transportation Needs: Not on file  Physical Activity: Not on file  Stress: Not on file   Social Connections: Not on file    Allergies:  Allergies  Allergen Reactions  . Asa [Aspirin] Nausea Only    abd pain  . Codeine Nausea Only  . Sulfa Antibiotics Hives    Metabolic Disorder Labs: Lab Results  Component Value Date   HGBA1C 6.7 (H) 09/27/2019   MPG 148.46 04/25/2018   No results found for: PROLACTIN Lab Results  Component Value Date   CHOL 207 (H) 09/27/2019   TRIG 152.0 (H) 09/27/2019   HDL 42.50 09/27/2019   CHOLHDL 5 09/27/2019   VLDL 30.4 09/27/2019   LDLCALC 134 (H) 09/27/2019   Lab Results  Component Value Date   TSH 1.57 09/27/2019   TSH 2.84 09/04/2018    Therapeutic Level Labs: No results found for: LITHIUM No results found for: VALPROATE No components found for:  CBMZ  Current Medications: Current Outpatient Medications  Medication Sig Dispense Refill  . ALPRAZolam (XANAX) 0.5 MG tablet Take 1 tablet (0.5 mg total) by mouth 2 (two) times daily as needed for anxiety. 60 tablet 2  . amphetamine-dextroamphetamine (ADDERALL) 10 MG tablet Take two tablets in am and one at noon 90 tablet 0  . [START ON 07/12/2020] amphetamine-dextroamphetamine (ADDERALL) 10 MG tablet Take two tablets in AM and one at noon 30 tablet 0  . [START ON 08/11/2020] amphetamine-dextroamphetamine (ADDERALL) 10 MG tablet Take two tablets in AM and one at noon 30 tablet 0  . albuterol (PROVENTIL HFA;VENTOLIN HFA) 108 (90 Base) MCG/ACT inhaler INHALE 2 PUFFS INTO THE LUNGS EVERY 6 HOURS AS NEEDED    . atorvastatin (LIPITOR) 80 MG tablet Take 1 tablet (80 mg total) by mouth daily. 90 tablet 3  . buPROPion (WELLBUTRIN XL) 300 MG 24 hr tablet Take 1 tablet (300 mg total) by mouth daily. 90 tablet 1  . HYDROcodone-acetaminophen (NORCO/VICODIN) 5-325 MG tablet Take 1 tablet by mouth at bedtime. FOR CHRONIC LEG PAIN 30 tablet 0  . Magnesium Oxide 400 MG CAPS Take 1 capsule (400 mg total) by mouth daily. 30 capsule 0  . metFORMIN (GLUCOPHAGE) 500 MG tablet TAKE 1 TABLET(500 MG) BY  MOUTH TWICE DAILY WITH A MEAL 180 tablet 0  . metolazone (ZAROXOLYN) 2.5 MG tablet Take 2 tablets (5 mg total) by mouth as directed. Take 5 mg on Monday and Thursday AS NEEDED ONLY for weight gain of 3 lbs or edema. 20 tablet 0  . montelukast (SINGULAIR) 10 MG tablet TAKE 1 TABLET BY MOUTH EVERY DAY 90 tablet 1  . polyethylene glycol powder (GLYCOLAX/MIRALAX) 17 GM/SCOOP powder MIX 17 GRAMS WITH LIQUID AND TAKE  PO D PRN FOR MILD CONSTIPATION    . potassium chloride (KLOR-CON) 10 MEQ tablet TAKE 1 TABLET BY MOUTH EVERY DAY 90 tablet 1  . torsemide (DEMADEX) 20 MG tablet Take 3 tablets (60 mg total) by mouth 2 (two) times daily. 180 tablet 11  . traZODone (  DESYREL) 50 MG tablet Take 1/2 to one tablet at bedtime as needed for sleep. 90 tablet 1  . umeclidinium-vilanterol (ANORO ELLIPTA) 62.5-25 MCG/INH AEPB INHALE 1 PUFF INTO THE LUNGS DAILY (Patient not taking: Reported on 12/04/2019) 180 each 3   No current facility-administered medications for this visit.      Psychiatric Specialty Exam: Review of Systems  Constitutional: Positive for fatigue.  Musculoskeletal: Positive for back pain.  Neurological: Positive for headaches.  Psychiatric/Behavioral: Positive for sleep disturbance. The patient is nervous/anxious.     There were no vitals taken for this visit.There is no height or weight on file to calculate BMI.  General Appearance: NA  Eye Contact:  NA  Speech:  Clear and Coherent  Volume:  Normal  Mood:  Anxious and Depressed  Affect:  NA  Thought Process:  Goal Directed  Orientation:  Full (Time, Place, and Person)  Thought Content: Rumination   Suicidal Thoughts:  No  Homicidal Thoughts:  No  Memory:  Immediate;   Good Recent;   Good Remote;   Good  Judgement:  Good  Insight:  Fair  Psychomotor Activity:  NA  Concentration:  Concentration: Good  Recall:  Good  Fund of Knowledge: Good  Language: Good  Akathisia:  Negative  Handed:  Right  AIMS (if indicated): not done   Assets:  Communication Skills Desire for Improvement Housing Resilience Social Support  ADL's:  Intact  Cognition: WNL  Sleep:  Fair   Screenings: GAD-7   Flowsheet Row Office Visit from 09/04/2018 in [facility]  Total GAD-7 Score 7    PHQ2-9   [facility] Visit from 01/08/2019 in [facility] Visit from 09/04/2018 in [facility]  PHQ-2 Total Score 6 6  PHQ-9 Total Score 12 14       Assessment and Plan: 65yo married female with long standing problems with anxiety and depression. She also has a hx of ADD. Her PCP wanted her to be followed by mental health and refused to prescribe Adderall which patient has been on for some time. She is chronically depressed and anxious due to multiple medical issues with  Chronic pain (multiple sites) being dominant. No SI now or ever. No hx of mania,psychosis, no alcohol/drug abuse.Shewas onLexapro 20 mg daily, Adderall 20 mg in AM and 10 mg at noon. Addition ofAbilify (2 mg) to augment Lexaprodid not help and she noticed increased appetite even after she stopped it.In thepast she tried several SSRIs/SNRIs as well as Trintellix with questionable benefit. She was also on bupropion but could not recall if it was helpful and given her chronic fatigue we decided to try it again. She started on Wellbutrin XL 150 mg andthen increased it to 300 mg. Shetolerates it welland her mood has improved some. She lost her insurance in middle of last year.She had to stop taking Adderall as it is not covered by her new insurancebut has since been restarted. We have restarted Wellbutrin and alprazolamand she finds both medications helpful.Multiple medical problems (CHF, DM, OSA, emphysema, right hand and shoulder pain) as well as financial stress contribute to chronic low grade depression. She cannot sleep well and feels tired despite taking Adderall. We have added trazodone 50 mg  and while it helps with sleep she feels residual sedation in AM.  Dx: Dysthymic disorder/GAD; ADD adult type  Plan:ContinueWellbutrin XL to 300 mg,alprazolam 0.5 mg prn anxiety andAdderall20 mg in AM and 10 mg at noon.We will continue trazodone  but she will try taking 25 mg not later than 10 PM. Next visit in3 months with a new provider.The plan was discussed with patient who had an opportunity to ask questions and these were all answered. I spend13minutes in phone consultation with the patient.   Stephanie Acre, MD 06/12/2020, 11:50 AM

## 2020-06-12 NOTE — Telephone Encounter (Signed)
[facility] pharmacy called regarding Adderall Rx's. Seems scripts for March and April only sent in for #30. Pt takes 3 qd. Please resend with #90. Thanks.

## 2020-07-09 ENCOUNTER — Other Ambulatory Visit: Payer: Self-pay | Admitting: Family Medicine

## 2020-07-09 DIAGNOSIS — E1159 Type 2 diabetes mellitus with other circulatory complications: Secondary | ICD-10-CM

## 2020-07-10 ENCOUNTER — Telehealth: Payer: Self-pay

## 2020-07-10 NOTE — Telephone Encounter (Signed)
..   LAST APPOINTMENT DATE: 5.20.21  NEXT APPOINTMENT DATE:@Visit  date not found  MEDICATION:umeclidinium-vilanterol (ANORO ELLIPTA) 62.5-25 MCG/INH AEPB    PHARMACY: [facility] Fax [phone]

## 2020-07-10 NOTE — Telephone Encounter (Signed)
Sorry for the error below. We were contacted by the company [facility] stating the pt was requesting a refill. However, it seems she did not. Thanks!

## 2020-07-10 NOTE — Telephone Encounter (Signed)
Pt says that she has 2 refills remaining. She says that she did not request this medication. Pt was told to follow up with Pulmonologist.

## 2020-08-13 ENCOUNTER — Other Ambulatory Visit: Payer: Self-pay

## 2020-08-13 ENCOUNTER — Telehealth: Payer: Self-pay

## 2020-08-13 MED ORDER — ANORO ELLIPTA 62.5-25 MCG/INH IN AEPB: INHALATION_SPRAY | RESPIRATORY_TRACT | 0 refills | Status: DC

## 2020-08-13 NOTE — Telephone Encounter (Signed)
Lvm for the pt to call the office back. 

## 2020-08-13 NOTE — Telephone Encounter (Signed)
We do not px this, from pulmnology and I haven't seen this patient in a long time. Can forward to her pulmonologist.  Dr. Rogers Blocker

## 2020-08-13 NOTE — Telephone Encounter (Signed)
Pharmacy called and requested we fax a copy of the prescription to [phone] for medication umeclidinium-vilanterol (ANORO ELLIPTA) 62.5-25 MCG/INH AEPB

## 2020-08-13 NOTE — Telephone Encounter (Signed)
I spoke with the [name] to give her the message below. She says that she did not requests this medication, she says that she already has it. [name] did say that she followed up in Pulmonology, but was not satisfied with the visit. She states that he did not listen to her chest.  She also says that he wants her to be retested for Sleep apnea, but she refused because she says she already knows she has it. She states that a Pallitive care nurse was coming out to her home, but quit because of Covid. She says that she cannot go anywhere, because she cannot breathe. She has an oxygen tank (rolling), but her hands hurt too bad to carry it.  I asked if there was anyone else that could help her get back and forth to her appointments, because she is over due on Diabetic care. She says that her husband can't breathe either.

## 2020-08-14 ENCOUNTER — Telehealth: Payer: Self-pay

## 2020-08-14 NOTE — Telephone Encounter (Signed)
Phone call placed to patient to check in and offer a FU visit with Palliative NP. Vm left with call back information

## 2020-08-20 ENCOUNTER — Telehealth: Payer: Self-pay

## 2020-08-20 IMAGING — CT CT CHEST W/ CM
2 of 3 series · 15 of 35 positions shown, 18 images · IV contrast (Omni 300)
Comparison: Chest x-ray dated April 22, 2018.

CLINICAL DATA: Acute respiratory failure and hypoxia.

EXAM:
CT CHEST WITH CONTRAST
TECHNIQUE: Multidetector CT imaging of the chest was performed during
intravenous contrast administration.
CONTRAST:  100mL OMNIPAQUE IOHEXOL 300 MG/ML  SOLN

[Series 7: chest with 1mm st · axial · 0.81mm/px · z∈[-602,-305]mm · 12 of 416 slices shown, 15 images]
[im 22/416  mediastinal]
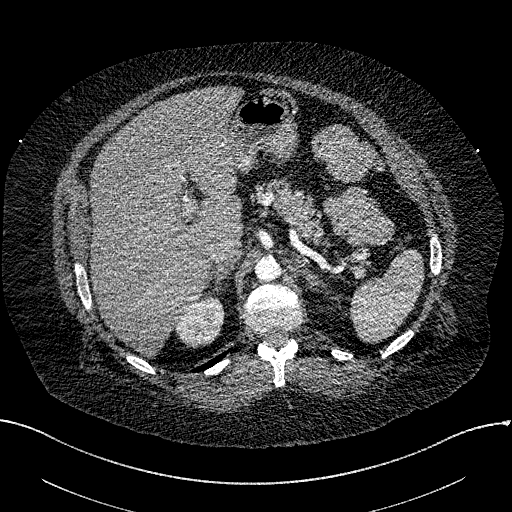
[im 22/416  lung]
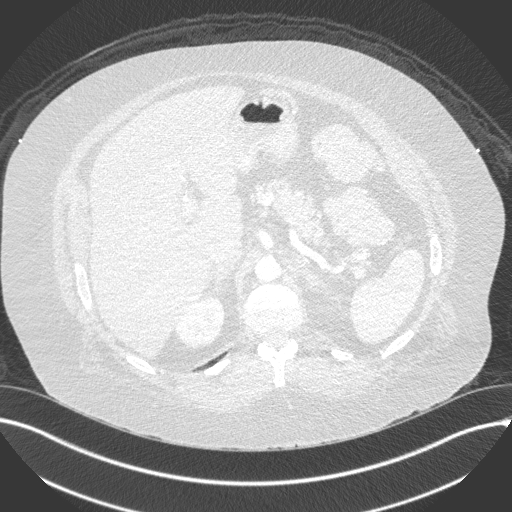
[im 66/416  lung]
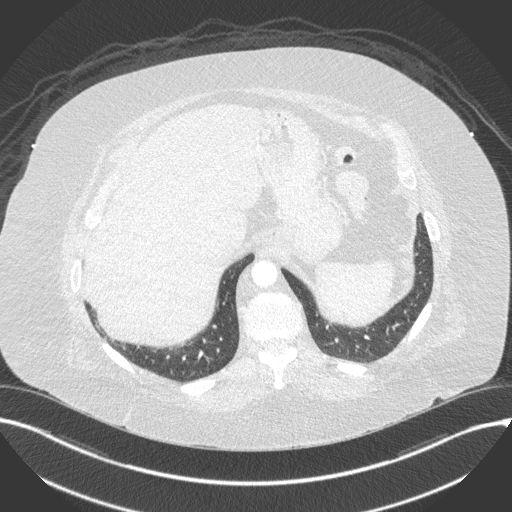
[im 88/416  lung]
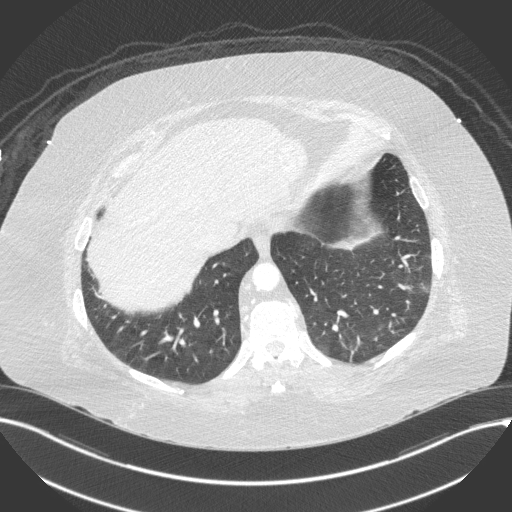
[im 132/416  lung]
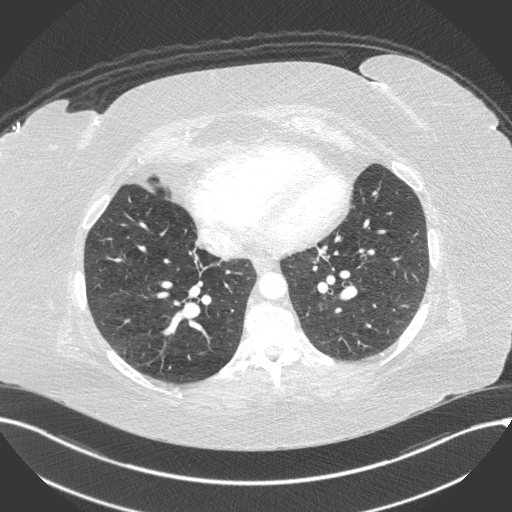
[im 153/416  mediastinal]
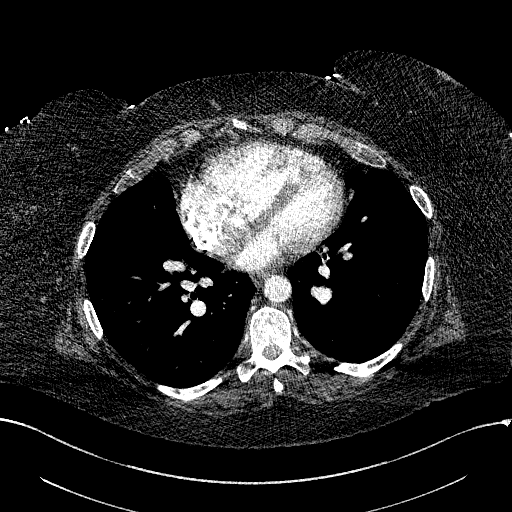
[im 153/416  lung]
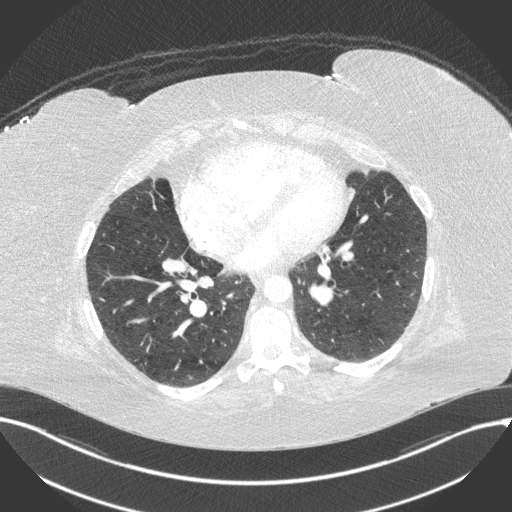
[im 197/416  lung]
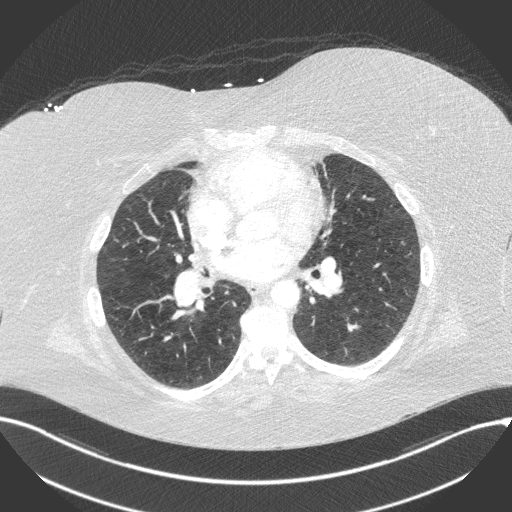
[im 219/416  lung]
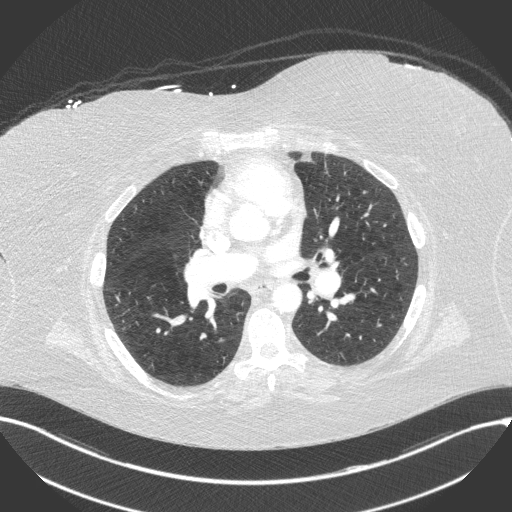
[im 263/416  lung]
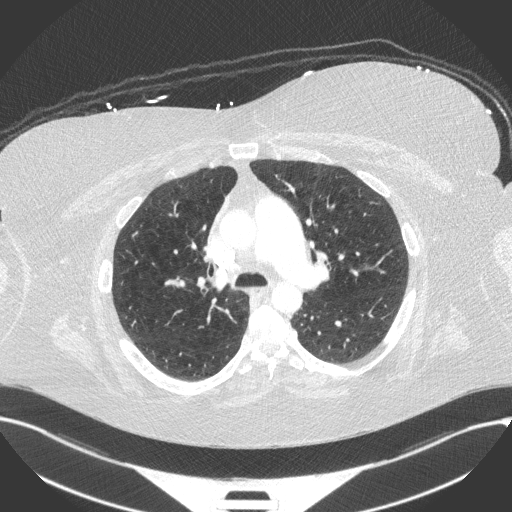
[im 284/416  mediastinal]
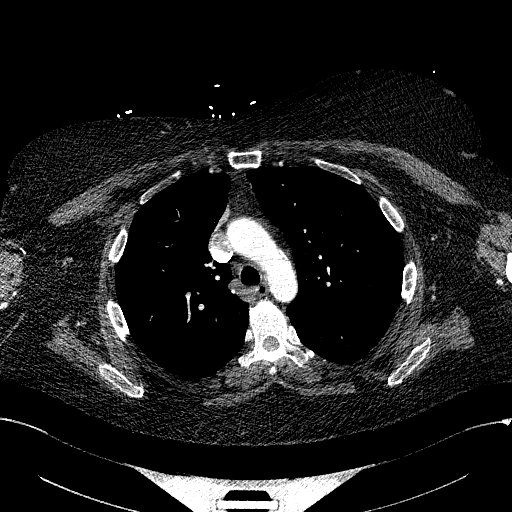
[im 284/416  lung]
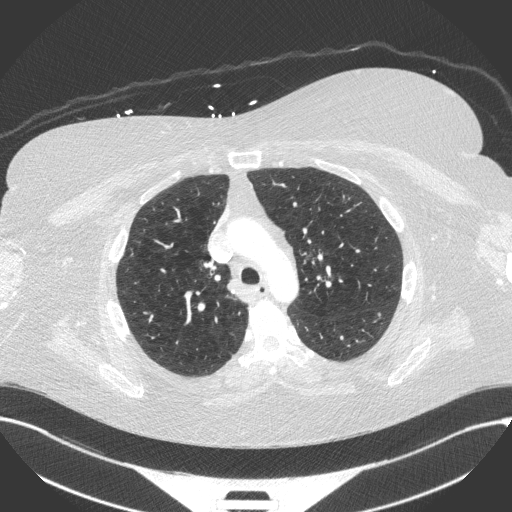
[im 328/416  lung]
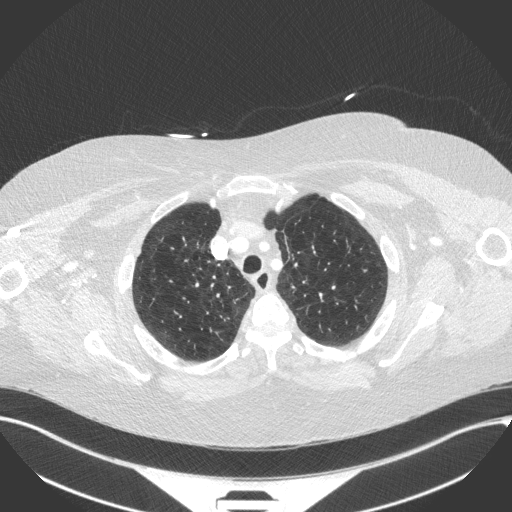
[im 350/416  lung]
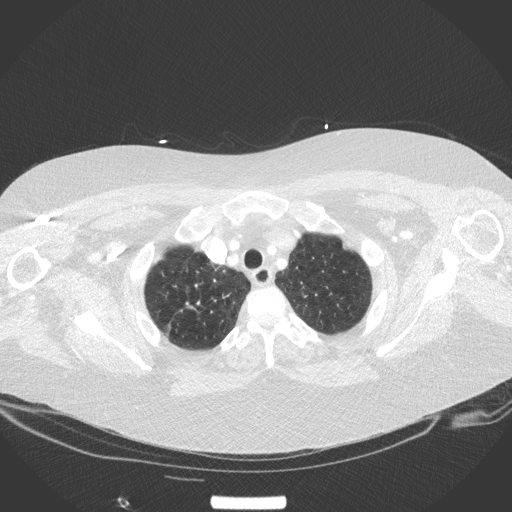
[im 394/416  lung]
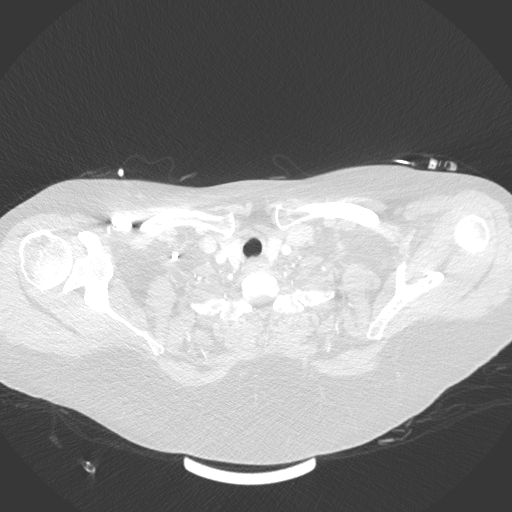

[Series 8: chest with 3mm st cor · coronal · 0.65mm/px · 3 of 101 slices shown]
[im 21/101  lung]
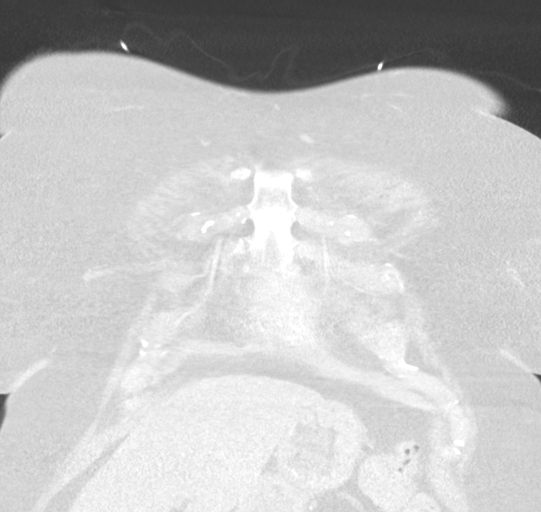
[im 41/101  lung]
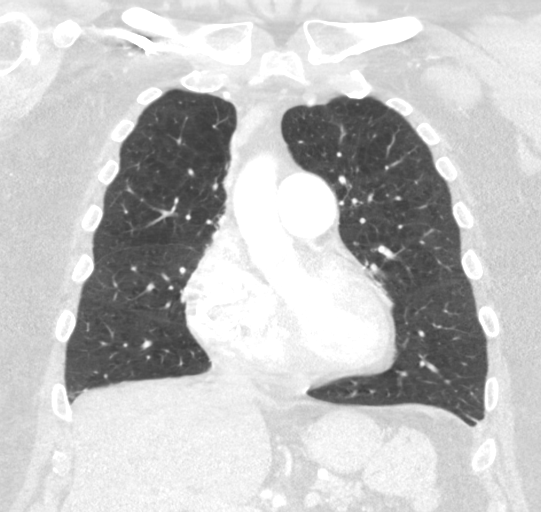
[im 61/101  lung]
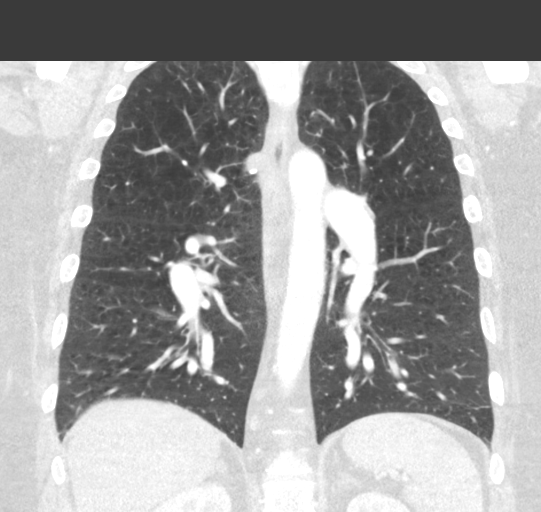

[15 of 35 positions shown; findings below may reference images not displayed]

FINDINGS: Cardiovascular: Mild right heart enlargement. No pericardial
effusion. No aortic aneurysm or dissection. Coronary, aortic arch,
and branch vessel atherosclerotic vascular disease. No central
pulmonary embolism. Dilated main pulmonary artery measuring up to
3.7 cm.

Mediastinum/Nodes: No enlarged mediastinal, hilar, or axillary lymph
nodes. Thyroid gland, trachea, and esophagus demonstrate no
significant findings.

Lungs/Pleura: Moderate centrilobular emphysema. No focal
consolidation, pleural effusion, or pneumothorax. 4 mm right upper
lobe pulmonary nodule (series 6, image 25).

Upper Abdomen: No acute abnormality. 2.0 cm left adrenal nodule is
incompletely characterized on this single phase study, however, this
was present on prior CT from September 2002 based on the available CT
report, although the images are not available for review. This
lesion is therefore most consistent with a benign adrenal adenoma.

Musculoskeletal: No chest wall abnormality. No acute or significant
osseous findings. Probable small bone island in the C7 vertebral
body.
IMPRESSION: 1.  No acute intrathoracic process.
2. Pulmonary arterial hypertension.
3. 4 mm right upper lobe pulmonary nodule. No follow-up needed if
patient is low-risk. Non-contrast chest CT can be considered in 12
months if patient is high-risk. This recommendation follows the
consensus statement: Guidelines for Management of Incidental
Pulmonary Nodules Detected on CT Images: From the [HOSPITAL]
4.  Emphysema (G7HFG-XZ6.R).
5.  Aortic atherosclerosis (G7HFG-ANF.F).

## 2020-08-20 NOTE — Telephone Encounter (Signed)
Phone call placed to patient to follow up. Patient stated that she is not doing well. She is having difficulty getting out to her PCP and is unable to have her meds refilled. Patient continues to have pain especially when she is up and walking and then she becomes short of breath. Patient tearful. Visit scheduled for 08/29/20. Will update Palliative NP.

## 2020-08-21 ENCOUNTER — Telehealth (INDEPENDENT_AMBULATORY_CARE_PROVIDER_SITE_OTHER): Payer: Self-pay

## 2020-08-28 ENCOUNTER — Telehealth: Payer: Self-pay | Admitting: Student

## 2020-08-28 NOTE — Telephone Encounter (Signed)
Message left for patient to confirm palliative appointment tomorrow. Awaiting return call.

## 2020-08-29 ENCOUNTER — Other Ambulatory Visit: Payer: No Typology Code available for payment source | Admitting: Student

## 2020-08-29 ENCOUNTER — Other Ambulatory Visit: Payer: Self-pay

## 2020-08-29 DIAGNOSIS — G8929 Other chronic pain: Secondary | ICD-10-CM

## 2020-08-29 DIAGNOSIS — F909 Attention-deficit hyperactivity disorder, unspecified type: Secondary | ICD-10-CM

## 2020-08-29 DIAGNOSIS — Z515 Encounter for palliative care: Secondary | ICD-10-CM

## 2020-08-29 DIAGNOSIS — I50812 Chronic right heart failure: Secondary | ICD-10-CM

## 2020-08-29 DIAGNOSIS — R0600 Dyspnea, unspecified: Secondary | ICD-10-CM

## 2020-08-29 DIAGNOSIS — F411 Generalized anxiety disorder: Secondary | ICD-10-CM

## 2020-08-29 NOTE — Progress Notes (Signed)
[facility] Pall[facility] Consult Note Telephone: [phone]  Fax: [phone]    Date of encounter: 08/29/20 PATIENT NAME: [place] [address]   [phone] (home)  DOB: 1956-02-11 MRN: [id number] PRIMARY CARE PROVIDER:    Orma Flaming, MD,  [place] [address] [phone]  REFERRING PROVIDER:   Orma Flaming, [address] [place],  [place] [address] [phone]  RESPONSIBLE PARTY:    Contact Information    Name Relation Home Work Antoine Son   [phone]   [place], Vogt [phone]  [phone]       I met face to face with patient and family in the home. Palliative Care was asked to follow this patient by consultation request of  Orma Flaming, MD to address advance care planning and complex medical decision making. This is a follow up visit.                                   ASSESSMENT AND PLAN / RECOMMENDATIONS:   Advance Care Planning/Goals of Care: Goals include to maximize quality of life and symptom management. Our advance care planning conversation included a discussion about:     The value and importance of advance care planning   Experiences with loved ones who have been seriously ill or have died   Exploration of personal, cultural or spiritual beliefs that might influence medical decisions   Exploration of goals of care in the event of a sudden injury or illness   Identification and preparation of a healthcare agent   Discussed MOST form; will fill out on next visit.  Decision not to resuscitate or to de-escalate disease focused treatments due to poor prognosis.  CODE STATUS: Full Code  Symptom Management/Plan:  Multiple joint pain-patient with chronic pain to joints: wrist, elbows, shoulders, knees. Patient having more difficulty getting out of home to appointments; Rheumatology referral recommended. Palliative working  on assisting patient with PCP that makes in home visits. Recommendations: continue acetaminophen 1046m TID; start meloxicam 7.542mdaily, tramadol 5033mID PRN for moderate to severe pain. E-script sent for meloxicam and tramadol.   Shortness of breath-due to chronic right sided heart failure, chronic respiratory failure with hypoxia, asthma. Continue oxygen via nasal canula 2-4 lpm, continue Anoro Ellipta daily, albuterol inhaler 2 puffs every 6 hours PRN, singular 50m45mily, Continue torsemide 60mg76m, metolazone 2.5mg P64m    GAD, depression, Adult ADHD-patient tearful; she states no worsening in mood/anxiety. Continue alprazolam, bupropion, Adderall as directed per psychiatry. Upcoming appointment with psychiatry in May as scheduled.      Follow up Palliative Care Visit: Palliative care will continue to follow for complex medical decision making, advance care planning, and clarification of goals. Return in 6 weeks or prn.   I spent 40 minutes providing this consultation. More than 50% of the time in this consultation was spent in counseling and care coordination.   PPS: 50%  HOSPICE ELIGIBILITY/DIAGNOSIS: TBD  Chief Complaint: Palliative Medicine follow up visit; pain.  HISTORY OF PRESENT ILLNESS:  Symia MYLINH CRAGG64 y.o74year old female  with chronic pain, heart failure, anxiety, depression.   Patient resides at home; significant other and son provide support. She reports sharp, constant pain to wrist, elbows, shoulders and knees; pain is a 8/10. She has been taking acetaminophen TID. Has taken tramadol or norco  in the past. She endorses fatigue and shortness of breath with any activity around the home such as showering, cooking or cleaning. She wears oxygen at night and prn during the day. O2 at 4lpm.  No falls or injury reported. Chronic pain and shortness of breath limit patient's ability to leave home.    History obtained from review of EMR, discussion with primary team,  and interview with family, facility staff/caregiver and/or Ms. Ryant.  I reviewed available labs, medications, imaging, studies and related documents from the EMR.  Records reviewed and summarized above.   ROS  General: NAD EYES: denies vision changes ENMT: denies dysphagia Cardiovascular: denies chest pain, Pulmonary: denies cough, SOB with exertion Abdomen: endorses good appetite, denies constipation GU: denies dysuria MSK: weakness,  no falls reported Skin: dry skin, no wounds Neurological: denies pain, denies insomnia Psych: stable mood Heme/lymph/immuno: denies bruises, abnormal bleeding  Physical Exam: Pulse 66, resp 20, b/p 120/ 70, sats 89% at rest, up to 93% with deep breathing on room air Constitutional: NAD General: chronically ill appearing, obese  EYES: anicteric sclera, lids intact, no discharge  ENMT: intact hearing, oral mucous membranes moist, dentition intact CV: S1S2, RRR, no LE edema Pulmonary: lungs clear, diminished bilaterally, no increased work of breathing, no cough Abdomen: intake 100%, normo-active BS + 4 quadrants, soft and non tender, no ascites GU: deferred MSK: decreased ROM to right hand, ambulatory Skin: warm and dry, no rashes or wounds on visible skin Neuro: generalized weakness,  no cognitive impairment Psych: A & O x 3; tearful at times during visit Hem/lymph/immuno: no widespread bruising   Thank you for the opportunity to participate in the care of Ms. Trager.  The palliative care team will continue to follow. Please call our office at [phone] if we can be of additional assistance.   Ezekiel Slocumb, NP   COVID-19 PATIENT SCREENING TOOL Asked and negative response unless otherwise noted:   Have you had symptoms of covid, tested positive or been in contact with someone with symptoms/positive test in the past 5-10 days? No

## 2020-09-05 ENCOUNTER — Other Ambulatory Visit: Payer: Self-pay | Admitting: Family Medicine

## 2020-09-05 ENCOUNTER — Other Ambulatory Visit: Payer: Self-pay

## 2020-09-05 ENCOUNTER — Telehealth: Payer: Self-pay

## 2020-09-05 MED ORDER — ANORO ELLIPTA 62.5-25 MCG/INH IN AEPB: 1.0000 | INHALATION_SPRAY | Freq: Every day | RESPIRATORY_TRACT | 0 refills | Status: DC

## 2020-09-05 MED ORDER — ANORO ELLIPTA 62.5-25 MCG/INH IN AEPB: 1.0000 | INHALATION_SPRAY | Freq: Every day | RESPIRATORY_TRACT | 1 refills | Status: AC

## 2020-09-05 NOTE — Telephone Encounter (Signed)
I spoke with representative from [facility] to make her aware, that refill will be faxed for 90 days only. Pt will need to establish care with another PCP. Current Provider will be leaving the facility. She voiced understanding.

## 2020-09-05 NOTE — Telephone Encounter (Signed)
Lorita Officer is calling in from [facility], they faxed over a request for a refill for the medication anoro ellipta, wondering if we have received it.

## 2020-10-16 ENCOUNTER — Telehealth: Payer: Self-pay

## 2020-10-16 NOTE — Telephone Encounter (Signed)
Palliative care volunteer telephone call placed to check in. No answer

## 2020-11-09 ENCOUNTER — Other Ambulatory Visit: Payer: Self-pay | Admitting: Cardiology

## 2020-11-11 ENCOUNTER — Telehealth: Payer: Self-pay

## 2020-11-11 NOTE — Telephone Encounter (Signed)
Phone call placed to [facility] to request oxygen tubing to be sent to patient. Spoke with Malachy Mood who said that insurance information needs to be updated to be able to send out supplies as there is a hold on her account.

## 2020-11-12 ENCOUNTER — Other Ambulatory Visit (HOSPITAL_BASED_OUTPATIENT_CLINIC_OR_DEPARTMENT_OTHER): Payer: Self-pay

## 2020-11-18 ENCOUNTER — Other Ambulatory Visit: Payer: Self-pay | Admitting: Family Medicine

## 2020-11-20 ENCOUNTER — Telehealth: Payer: Self-pay

## 2020-11-20 NOTE — Telephone Encounter (Signed)
Phone call placed to patient to provide update regarding [employer] sending out oxygen supplies. Patient became tearful. She is having difficulty getting her medications refilled as she is unable to get to the doctor's offices. Reached out to some PCP's that make home visits. Update sent to Primary PC team. Scheduled visit for Palliative care visit with Dr. Mariea Clonts on 11/25/20.

## 2020-11-21 ENCOUNTER — Other Ambulatory Visit: Payer: Self-pay | Admitting: Student

## 2020-11-21 NOTE — Progress Notes (Signed)
Patient is out of several prescriptions. Palliative NP sent scripts for alprazolam and singulair. She will be seen by Palliative on 7/19.

## 2020-11-25 ENCOUNTER — Other Ambulatory Visit: Payer: No Typology Code available for payment source | Admitting: Student

## 2020-11-25 ENCOUNTER — Other Ambulatory Visit: Payer: Self-pay

## 2020-11-25 ENCOUNTER — Telehealth: Payer: Self-pay

## 2020-11-25 DIAGNOSIS — F411 Generalized anxiety disorder: Secondary | ICD-10-CM

## 2020-11-25 DIAGNOSIS — F339 Major depressive disorder, recurrent, unspecified: Secondary | ICD-10-CM

## 2020-11-25 DIAGNOSIS — R600 Localized edema: Secondary | ICD-10-CM

## 2020-11-25 DIAGNOSIS — R52 Pain, unspecified: Secondary | ICD-10-CM

## 2020-11-25 DIAGNOSIS — R0602 Shortness of breath: Secondary | ICD-10-CM

## 2020-11-25 DIAGNOSIS — Z515 Encounter for palliative care: Secondary | ICD-10-CM

## 2020-11-25 NOTE — Progress Notes (Signed)
[facility] Consult Note Telephone: [phone]  Fax: [phone]    Date of encounter: 11/25/20 PATIENT NAME: [name] [id number]   [phone] (home)  DOB: January 09, 1956 MRN: [id number] PRIMARY CARE PROVIDER:    Orma Flaming, MD,  [place] [address] [phone]  REFERRING PROVIDER:   Orma Flaming, [place],   [id number] [phone]  RESPONSIBLE PARTY:    Contact Information     Name Relation Home Work Mobile   Daylene Katayama   [phone]   [place], [place] [phone]  [phone]        Due to the COVID-19 crisis, this visit was done via telemedicine from my office and it was initiated and consent by this patient and or family.  Due to the COVID-19 crisis, this home visit was done via telephone due to the patient's inability to connect via an audiovisual connection or their refusal to have an in-person visit. This connection was agreed to by the patient. Verified that I am speaking with the correct person using two identifiers.  I discussed the limitations of evaluation and management by telemedicine. The patient expressed understanding and agreed to proceed.                                    ASSESSMENT AND PLAN / RECOMMENDATIONS:   Advance Care Planning/Goals of Care: Goals include to maximize quality of life and symptom management. Our advance care planning conversation included a discussion about:    CODE STATUS: Full Code  Symptom Management/Plan:  Multiple joint pain, neuropathy to feet-patient with chronic pain to joints: wrist, elbows, shoulders, knees. Recommend continuing meloxicam 7.5mg  daily. Counseled on not using ibuprofen with the meloxicam. She is encouraged to restart the acetaminophen 1000 mg TID as she has ran out. Patient with worsening feet pain. Has taken gabapentin in the past, felt this was helpful but  prescription ran out and her previous PCP left practice. Recommend gabapentin 100 mg QHS for pain; script sent. She has tramadol BID PRN for moderate pain. Recommend Rheumatology consult given pain to multiple joints.   Shortness of breath-due to chronic right sided heart failure, chronic respiratory failure with hypoxia, asthma. Continue oxygen via nasal canula 2-4 lpm, continue Anoro Ellipta daily, albuterol inhaler 2 puffs every 6 hours PRN, singular 10mg  daily. Continue torsemide 60mg  BID, metolazone 2.5mg  PRN.  Edema-secondary to heart failure. Continue diuretics as directed. Recommend daily weights.  GAD, depression, adult ADHD-patient reports being tearful at times. She did pick up prescription for alprazolam. She is to continue her bupropion and alprazolam as directed. Her psychiatrist recently resigned. Palliative Nurse Navigator is working on finding a new psychiatrist as that is who had been managing her GAD, depression, ADHD, prescribing her Adderall.    Follow up Palliative Care Visit: Palliative care will continue to follow for complex medical decision making, advance care planning, and clarification of goals. Return in 4 weeks or prn.   This visit was coded based on medical decision making (MDM).  PPS: 50%  HOSPICE ELIGIBILITY/DIAGNOSIS: TBD  Chief Complaint: Palliative medicine follow up visit.   HISTORY OF PRESENT ILLNESS:  [name] is a 65 y.o. year old female  with chronic pain, heart failure, anxiety, depression.    Patient reports not feeling well. She has non-specific complaints. She denies any fever, chills. Has  not been around anyone ill, nor does she go out of the house. No cold symptoms, no urinary symptoms. She continues to report pain to wrist, elbows, right elbow pain is worse, shoulders, knees. She states the pain to her feet is the worst now. She reports sharp, firey pain, numbness to bilateral feet. She has taken gabapentin in the past, but states her  prescription ran out and it was never refilled when previous PCP left practice. Patient with shortness of breath; wears oxygen at 4 liters; patient checked sats are 97% on 4 liters during visit today. She endorses swelling to her feet and legs that fluctuates. She is taking torsemide BID, rarely needs to take the Zaroxolyn. Her weight is 242 pounds; she is down 7 pounds. She expresses wanting to lose weight.     History obtained from review of EMR, discussion with primary team, and interview with family, facility staff/caregiver and/or Ms. Bhargava.  I reviewed available labs, medications, imaging, studies and related documents from the EMR.  Records reviewed and summarized above.   ROS  General: NAD EYES: denies vision changes ENMT: denies dysphagia Cardiovascular: denies chest pain Pulmonary: denies cough, SOB with exertion Abdomen: endorses good appetite, denies constipation, endorses continence of bowel GU: denies dysuria MSK: weakness,  no falls reported Skin: denies rashes or wounds Neurological: denies pain, denies insomnia Psych: Endorses stable mood Heme/lymph/immuno: denies bruises, abnormal bleeding  Physical Exam:  Deferred due to Telemedicine visit.    Thank you for the opportunity to participate in the care of Ms. Fahr.  The palliative care team will continue to follow. Please call our office at [phone] if we can be of additional assistance.   Ezekiel Slocumb, NP   COVID-19 PATIENT SCREENING TOOL Asked and negative response unless otherwise noted:   Have you had symptoms of covid, tested positive or been in contact with someone with symptoms/positive test in the past 5-10 days? No

## 2020-11-25 NOTE — Telephone Encounter (Signed)
VM left for patient to check in and offer telehealth visit for today at 3:30pm. VM left

## 2020-12-12 ENCOUNTER — Other Ambulatory Visit: Payer: Self-pay

## 2020-12-12 MED ORDER — ATORVASTATIN CALCIUM 80 MG PO TABS: 80.0000 mg | ORAL_TABLET | Freq: Every day | ORAL | 0 refills | Status: AC

## 2020-12-15 ENCOUNTER — Other Ambulatory Visit: Payer: Self-pay

## 2020-12-29 ENCOUNTER — Telehealth: Payer: Self-pay

## 2020-12-29 NOTE — Telephone Encounter (Signed)
12/29/20 @ 1PM: Palliative care SW outreached patient per, palliative care MD - T. Reed, to assess mental health needs.  Patient shares that she is in need of a psychiatrist to manage her psychotropic medications to include re-fills. Patients previous psychiatrist [place]located out of the country and his office was not able to establish her a new provider.   SW discussed [facility] accepting new patients or SW could do a referral to the virtual mental health platform, [facility]. Patient opted for SW to place referral through [facility], as patient as not left her home in three years and desires virtual services.  Mental Health referral placed to [facility], for medication management of psychotropic medications. Patient made aware of next steps and communication tactics of quartet to include telephone calls, voicemails and SMS messages. Patient stated understanding.   No further needs of SW at this time.

## 2020-12-31 ENCOUNTER — Telehealth: Payer: Self-pay

## 2020-12-31 NOTE — Telephone Encounter (Signed)
Visit scheduled for Monday 01/04/21 @ 11:30 am

## 2021-01-05 ENCOUNTER — Other Ambulatory Visit: Payer: Self-pay

## 2021-01-05 ENCOUNTER — Other Ambulatory Visit: Payer: No Typology Code available for payment source

## 2021-01-05 DIAGNOSIS — Z515 Encounter for palliative care: Secondary | ICD-10-CM

## 2021-01-05 NOTE — Progress Notes (Signed)
1010 am. Joint visit completed with Maxwell Caul, RN to complete blood work.  Patient found in her room but ambulated to the kitchen for better lighting.  Right AC accessed but vein failed.  Right hand accessed and blood work obtained by Inez Catalina, Therapist, sports.  Patient tolerated procedure well.  Blood work to be taken to [facility] for processing and results will be faxed to Palliative Care and PCP.

## 2021-01-27 NOTE — Telephone Encounter (Signed)
error 

## 2021-07-28 ENCOUNTER — Other Ambulatory Visit: Payer: No Typology Code available for payment source | Admitting: Internal Medicine

## 2021-07-28 ENCOUNTER — Other Ambulatory Visit: Payer: Self-pay

## 2021-07-28 VITALS — BP 157/83 | HR 87 | Resp 16 | Wt 229.0 lb

## 2021-07-28 DIAGNOSIS — E1169 Type 2 diabetes mellitus with other specified complication: Secondary | ICD-10-CM

## 2021-07-28 DIAGNOSIS — J432 Centrilobular emphysema: Secondary | ICD-10-CM

## 2021-07-28 DIAGNOSIS — F330 Major depressive disorder, recurrent, mild: Secondary | ICD-10-CM

## 2021-07-28 DIAGNOSIS — F909 Attention-deficit hyperactivity disorder, unspecified type: Secondary | ICD-10-CM

## 2021-07-28 DIAGNOSIS — L853 Xerosis cutis: Secondary | ICD-10-CM

## 2021-07-28 NOTE — Progress Notes (Signed)
[name] Follow-Up Visit Telephone: [phone]  Fax: [phone]   Date of encounter: 07/28/21 8:03 AM PATIENT NAME: [name] [address] [address]   [phone] (home)  DOB: 1955/07/20 MRN: [id number] PRIMARY CARE PROVIDER:    me  REFERRING PROVIDER:   ?  RESPONSIBLE PARTY:    Contact Information     Name Relation Home Work [place] Son   [phone]   Nida, Manfredi [phone]  [phone]        I met face to face with patient and family in her home. Palliative Care was asked to follow this patient by consultation request of  No ref. provider found to address advance care planning and complex medical decision making. This is follow-up visit.                                     ASSESSMENT AND PLAN / RECOMMENDATIONS:   Symptom Management/Plan: 1. Centrilobular emphysema (HCC) -Omari;s breathing has improved with 22 lb weight loss on jardiance and also attempts at dietary changes (has been hard as husband does not eat veggies) -continue current trelegy ellipta, prn albuterol and nebs, now using O2 as needed rather than continuous -requested we take back the portable machine as it's too heavy for her to use anyway  2. Adult ADHD -continue adderall ([facility]ru [facility]), doing ok here  3. Mild episode of recurrent major depressive disorder ([place]) -spirits a little better as she's been able to go out on her porch some on good weather days and be a bit more mobile, cook more which is one of her favorite things to do -continue to encourage these activities and socialization (friends visit)  4. Xerosis cutis -recommended trying aquaphor cream for her skin right after showering because she's not had good success with eucerin and other lotions  5. Type 2 diabetes mellitus with other specified complication, without long-term current use of insulin (HCC) -will f/u hba1c, cbc, bmp, and  flp to reassess where she is now that she's been on jardiance for 3 mos and has lost weight -she's had no difficulty with yeast infections or UTIs  6. Hyperlipidemia associated with type 2 diabetes mellitus ([place]) -continue statin therapy, diet and work on more walking in the home for continued weight loss -f/u labs fasting in near future    Follow up Palliative Care Visit: Palliative care will continue to follow for complex medical decision making, advance care planning, and clarification of goals. Return 10/27/2021 and prn.  This visit was coded based on medical decision making (MDM).  PPS: 50%  HOSPICE ELIGIBILITY/DIAGNOSIS: COPD/not at present  Chief Complaint: Follow-up palliative visit  HISTORY OF PRESENT ILLNESS:  [name] is a 66 y.o. year old female  with COPD, DMII, obesity, hyperlipidemia, depression, ADHD and chronic venous stasis among others seen for routine palliative follow-up in her home.     Walaa is doing pretty well.  She's lost 22 lbs on jardiance so far.  She is breathing better, moving around her home more and has been able to cook a bit more.  Made some homemade potstickers (wore her out though).    Struggling with really dry scaly skin that she asks about.  Also looking into some studies and asked me about them.  Discussed that she likely will need updated pulmonary appt, CT and PFTs for that  and she's not wanting to do it b/c it's hard to get into that office physically with her breathing.    She's been able to use only one diuretic prn in the evening for swelling since being on jardiance instead of 3 in am and 2 in pm like she'd been doing.    She's not had any utis or yeast infections.    She's wondering if she could also take ozempic or starlix.    She asked about medicare coverage for a replacement bathtub--current is freestanding and she can't get out of it with her mobility, obesity and breathing.    She's cut back on her gabapentin down to the  135m pills she still had b/c 3025mmade her too sleepy all day long the next day.     History obtained from review of EMR, discussion with primary team, and interview with family, facility staff/caregiver and/or Ms. Pellegrin.  I reviewed available labs, medications, imaging, studies and related documents from the EMR.  Records reviewed and summarized above.   ROS  General: NAD EYES: denies vision changes ENMT: denies dysphagia Cardiovascular: denies chest pain, has DOE but less severe Pulmonary: denies cough, chronic SOB Abdomen: endorses good appetite, denies constipation, endorses continence of bowel GU: denies dysuria, endorses continence of urine MSK:  denies increased weakness,  no falls reported, getting around better Skin: denies rashes or wounds Neurological: denies pain, denies insomnia Psych: Endorses positive mood but still tearful at times in visit Heme/lymph/immuno: denies bruises, abnormal bleeding  Physical Exam: Current and past weights:  229 lbs Constitutional: NAD  General: obese, I could see that she'd lost weight EYES: anicteric sclera, lids intact, no discharge  ENMT: intact hearing, oral mucous membranes moist, dentition intact CV: S1S2, RRR, no LE edema, chronic venous stasis changes Pulmonary: LCTA, no increased work of breathing, no cough, room air, no wheezing today Abdomen: intake 100%, normo-active BS + 4 quadrants, soft and non tender, no ascites GU: deferred MSK: no sarcopenia, moves all extremities, ambulatory--wide-based gait Skin: warm and dry, no rashes or wounds on visible skin, chronic hyperpigmentation of lower legs, very dry scaly skin of arms and legs especially feet Neuro:  generalized weakness,  no cognitive impairment Psych: non-anxious affect, A and O x 3. Tearful at times Hem/lymph/immuno: no widespread bruising  CURRENT PROBLEM LIST:  Patient Active Problem List   Diagnosis Date Noted   Dupuytren's contracture of right hand  09/27/2019   Dysthymic disorder 10/10/2018   Polycythemia 09/15/2018   Vitamin D deficiency 09/09/2018   Adult ADHD 07/03/2018   GAD (generalized anxiety disorder) 07/03/2018   Mild episode of recurrent major depressive disorder (HC[facility]02/24/2020   Centrilobular emphysema (HCLodge[facility]01/24/2020   OSA (obstructive sleep apnea) 06/02/2018   Pulmonary nodule 05/01/2018   Obesity (BMI 30-39.9)    Chronic respiratory failure with hypoxia (HCL[facility]12/14/2019   Chronic right-sided CHF (congestive heart failure) (HC[facility] St. Paul12/14/2019   PAH (pulmonary artery hypertension) (HC[facility]12/14/2019   Benzodiazepine dependence, continuous (HCMack10/16/2019   Serrated adenoma of colon 08/01/2017   Non-seasonal allergic rhinitis due to pollen 08/16/2016   Non-insulin dependent type 2 diabetes mellitus (HCY[facility]03/04/2017   Hyperlipidemia associated with type 2 diabetes mellitus (HCHayti [facility]03/04/2017   Restless leg syndrome 01/07/2011   Mild persistent asthma 01/07/2011   Hiatal hernia 07/28/2006   Collagenous colitis 07/28/2006   PAST MEDICAL HISTORY:  Active Ambulatory Problems    Diagnosis Date Noted   Chronic respiratory failure with hypoxia (HCSouth Amb[facility]12/14/2019   Chronic  right-sided CHF (congestive heart failure) ([place]) 04/22/2018   PAH (pulmonary artery hypertension) ([place]) 04/22/2018   Obesity (BMI 30-39.9)    Centrilobular emphysema ([place]) 06/02/2018   OSA (obstructive sleep apnea) 06/02/2018   Adult ADHD 07/03/2018   GAD (generalized anxiety disorder) 07/03/2018   Mild episode of recurrent major depressive disorder ([place]) 07/03/2018   Non-insulin dependent type 2 diabetes mellitus ([place]) 07/19/2016   Serrated adenoma of colon 08/01/2017   Restless leg syndrome 01/07/2011   Pulmonary nodule 05/01/2018   Non-seasonal allergic rhinitis due to pollen 08/16/2016   Mild persistent asthma 01/07/2011   Hyperlipidemia associated with type 2 diabetes mellitus ([place]) 07/19/2016   Hiatal hernia 07/28/2006   Collagenous colitis  07/28/2006   Benzodiazepine dependence, continuous ([place]) 02/22/2018   Vitamin D deficiency 09/09/2018   Polycythemia 09/15/2018   Dysthymic disorder 10/10/2018   Dupuytren's contracture of right hand 09/27/2019   Resolved Ambulatory Problems    Diagnosis Date Noted   Hypochloremic alkalosis 04/22/2018   Prerenal azotemia 04/22/2018   Pulmonary hypertension ([place])    Congestive heart failure ([place])    Hypoxia    Positive blood culture    Major depression, recurrent, full remission ([place]) 09/20/2011   ADHD (attention deficit hyperactivity disorder) 06/29/2012   Past Medical History:  Diagnosis Date   ADHD    Depression    GERD (gastroesophageal reflux disease)    Migraine    SOCIAL HX:  Social History   Tobacco Use   Smoking status: Former    Packs/day: 0.50    Types: Cigarettes   Smokeless tobacco: Never  Substance Use Topics   Alcohol use: No     ALLERGIES:  Allergies  Allergen Reactions   Asa [Aspirin] Nausea Only    abd pain   Codeine Nausea Only   Sulfa Antibiotics Hives     PERTINENT MEDICATIONS:  Outpatient Encounter Medications as of 07/28/2021  Medication Sig   albuterol (PROVENTIL HFA;VENTOLIN HFA) 108 (90 Base) MCG/ACT inhaler INHALE 2 PUFFS INTO THE LUNGS EVERY 6 HOURS AS NEEDED   amphetamine-dextroamphetamine (ADDERALL) 10 MG tablet Take two tablets in am and one at noon   amphetamine-dextroamphetamine (ADDERALL) 10 MG tablet Take two tablets in AM and one at noon   amphetamine-dextroamphetamine (ADDERALL) 10 MG tablet Take two tablets in AM and one at noon   atorvastatin (LIPITOR) 80 MG tablet Take 1 tablet (80 mg total) by mouth daily. Pt will need an appointment for additional refills   buPROPion (WELLBUTRIN XL) 300 MG 24 hr tablet Take 1 tablet (300 mg total) by mouth daily.   HYDROcodone-acetaminophen (NORCO/VICODIN) 5-325 MG tablet Take 1 tablet by mouth at bedtime. FOR CHRONIC LEG PAIN (Patient not taking: Reported on 08/29/2020)   Magnesium Oxide  400 MG CAPS Take 1 capsule (400 mg total) by mouth daily.   metFORMIN (GLUCOPHAGE) 500 MG tablet TAKE 1 TABLET(500 MG) BY MOUTH TWICE DAILY WITH A MEAL (Patient not taking: Reported on 08/29/2020)   metolazone (ZAROXOLYN) 2.5 MG tablet Take 2 tablets (5 mg total) by mouth as directed. Take 5 mg on Monday and Thursday AS NEEDED ONLY for weight gain of 3 lbs or edema.   montelukast (SINGULAIR) 10 MG tablet TAKE 1 TABLET BY MOUTH EVERY DAY   polyethylene glycol powder (GLYCOLAX/MIRALAX) 17 GM/SCOOP powder MIX 17 GRAMS WITH LIQUID AND TAKE  PO D PRN FOR MILD CONSTIPATION   potassium chloride (KLOR-CON) 10 MEQ tablet TAKE 1 TABLET BY MOUTH EVERY DAY   torsemide (DEMADEX) 20 MG tablet TAKE  THREE TABLETS BY MOUTH TWICE A DAY   traZODone (DESYREL) 50 MG tablet Take 1/2 to one tablet at bedtime as needed for sleep. (Patient not taking: Reported on 08/29/2020)   No facility-administered encounter medications on file as of 07/28/2021.   Thank you for the opportunity to participate in the care of Ms. Spainhower.  The palliative care team will continue to follow. Please call our office at [phone] if we can be of additional assistance.   Hollace Kinnier, DO  COVID-19 PATIENT SCREENING TOOL Asked and negative response unless otherwise noted:  Have you had symptoms of covid, tested positive or been in contact with someone with symptoms/positive test in the past 5-10 days? no

## 2021-07-29 ENCOUNTER — Telehealth: Payer: Self-pay

## 2021-07-29 ENCOUNTER — Encounter: Payer: Self-pay | Admitting: Internal Medicine

## 2021-07-29 MED ORDER — TORSEMIDE 20 MG PO TABS: 20.0000 mg | ORAL_TABLET | Freq: Every day | ORAL | 3 refills | Status: AC | PRN

## 2021-07-29 NOTE — Telephone Encounter (Signed)
(  10:33 am) SW attempted to contact patient to provide resources and support. SW left a message requesting a call back.  ?

## 2021-08-05 ENCOUNTER — Telehealth: Payer: Self-pay | Admitting: *Deleted

## 2021-08-05 NOTE — Telephone Encounter (Signed)
Called patient and scheduled date/time to draw requested labs (cbc w/diff, bmp, HgbA1c, fasting lipid panel) from Dr. Hollace Kinnier. Visit scheduled for 08/11/21'@10a'$ .  ?

## 2021-08-11 ENCOUNTER — Other Ambulatory Visit: Payer: No Typology Code available for payment source | Admitting: *Deleted

## 2021-08-11 DIAGNOSIS — Z515 Encounter for palliative care: Secondary | ICD-10-CM

## 2021-08-11 NOTE — Progress Notes (Signed)
[facility] PALLIATIVE CARE RN NOTE ? ?PATIENT NAME: [name] ?DOB: [birth year] ?MRN: [id number] ? ?PRIMARY CARE PROVIDER: Pcp, No ? ?RESPONSIBLE PARTY: Hollace Hayward (son) ?Acct ID - Guarantor Home Phone Work Phone Relationship Acct Type  ?[id number] [name], BUTCH* [phone]  Self P/F  ?   [address] [place], [place] [address]  ? ?Covid-19 Pre-screening Negative ? ?RN visit made to draw labs as requested by Dr. Hollace Kinnier. Attempted x 3 but was unsuccessful. Communication sent to Dr. Mariea Clonts to make her aware and to inquire about next steps. She denies any pain or discomfort today. No complaints other than some allergy related symptoms from sitting outside on the porch yesterday. Palliative care will continue to follow.  ? ?(Duration of visit and documentation 60 minutes) ? ? ?Daryl Eastern, RN BSN ? ?

## 2021-08-27 ENCOUNTER — Telehealth: Payer: Self-pay

## 2021-08-27 NOTE — Telephone Encounter (Signed)
1130 am.   Request received from Dr. Sarina Ill, RN regarding need for blood work to be obtained.  Phone call made to patient to schedule a home visit to obtain blood work.  No answer.  Message left with request to call back.  ?

## 2021-09-28 ENCOUNTER — Other Ambulatory Visit: Payer: Self-pay | Admitting: Internal Medicine

## 2021-09-28 DIAGNOSIS — E1169 Type 2 diabetes mellitus with other specified complication: Secondary | ICD-10-CM

## 2021-09-28 MED ORDER — EMPAGLIFLOZIN 25 MG PO TABS: 25.0000 mg | ORAL_TABLET | Freq: Every day | ORAL | 3 refills | Status: AC

## 2021-09-28 NOTE — Progress Notes (Signed)
[name] reached out to me due to need a new Rx for her jardiance '25mg'$  which I [facility].  She reports nausea soon after eating.  She has only been able to really eat one meal per day while this has been happening.  She has purchased some zegerid which helped her before.  Encouraged her to take the PPI before her main meal.  She's also begun eating hard boiled eggs for breakfast which have done ok.

## 2021-09-29 ENCOUNTER — Telehealth: Payer: Self-pay

## 2021-09-29 NOTE — Telephone Encounter (Signed)
310 pm.  Message received from patient regarding blood work.  Return call made and patient is agreeable to have blood work drawn next Tuesday.  Ordered by Dr. Hollace Kinnier: cbc with diff, bmp hba1c, and fasting lipid

## 2021-10-06 ENCOUNTER — Other Ambulatory Visit: Payer: No Typology Code available for payment source

## 2021-10-06 DIAGNOSIS — Z515 Encounter for palliative care: Secondary | ICD-10-CM

## 2021-10-06 NOTE — Progress Notes (Signed)
PATIENT NAME: [name] DOB: [birth year] MRN: [id number]  PRIMARY CARE PROVIDER: Pcp, No  RESPONSIBLE PARTY:  Acct ID - Guarantor Home Phone Work Phone Relationship Acct Type  [id number] ELIANNY, [phone]  Self P/F     [place], [place], [place] [address]    Patient seen today for blood work that was ordered by Dr. Hollace Kinnier (cbc, bmp, HgbA1C and lipid panel).  Patient ambulated to the kitchen with O2 in place.  Explained purpose of visit and procedure.  Patient agreeable to proceed.  Right hand accesses x 1.    Blood work taken to [facility] for processing.  Results to be faxed to Dr. Mariea Clonts for review.    Lorenza Burton, RN

## 2021-10-27 ENCOUNTER — Other Ambulatory Visit: Payer: No Typology Code available for payment source | Admitting: Internal Medicine

## 2021-10-27 VITALS — BP 167/85 | HR 78 | Resp 16 | Wt 230.0 lb

## 2021-10-27 DIAGNOSIS — I10 Essential (primary) hypertension: Secondary | ICD-10-CM

## 2021-10-27 DIAGNOSIS — E1169 Type 2 diabetes mellitus with other specified complication: Secondary | ICD-10-CM

## 2021-10-27 DIAGNOSIS — F330 Major depressive disorder, recurrent, mild: Secondary | ICD-10-CM

## 2021-10-27 DIAGNOSIS — F909 Attention-deficit hyperactivity disorder, unspecified type: Secondary | ICD-10-CM

## 2021-10-27 DIAGNOSIS — J432 Centrilobular emphysema: Secondary | ICD-10-CM

## 2021-10-27 NOTE — Progress Notes (Signed)
[place] Follow-Up Visit Telephone: [phone]  Fax: [phone]   Date of encounter: 10/27/21 6:40 AM PATIENT NAME: [name] [address] [place] [address]   [phone] (home)  DOB: November 04, 1955 MRN: [id number] PRIMARY CARE PROVIDER:    Pcp, No,  No address on file None  REFERRING PROVIDER:   No referring provider defined for this encounter. N/A  RESPONSIBLE PARTY:    Contact Information     Name Relation Home Work [place] Son   [phone]   [name] [phone]  [phone]        I met face to face with patient and family in her home/facility. Palliative Care was asked to follow this patient by consultation request of  No ref. provider found to address advance care planning and complex medical decision making. This is follow-up visit.                                     ASSESSMENT AND PLAN / RECOMMENDATIONS:   Advance Care Planning/Goals of Care: Goals include to maximize quality of life and symptom management. Patient/health care surrogate gave his/her permission to discuss.Our advance care planning conversation included a discussion about:    The value and importance of advance care planning  Experiences with loved ones who have been seriously ill or have died  Exploration of personal, cultural or spiritual beliefs that might influence medical decisions  Exploration of goals of care in the event of a sudden injury or illness  Identification  of a healthcare agent  Review and updating or creation of an  advance directive document . Decision not to resuscitate or to de-escalate disease focused treatments due to poor prognosis. CODE STATUS:  full   Symptom Management/Plan:  Lumbar spinal stenosis:  Use tylenol for arthritis pain for breakthrough.  Continue daily mobic.  Heat.  Warm shower.    DMII with neuropathy:  hba1c down to 6.4 with jardiance and weight stable now  (had lost 15 lbs when first started; encouraged healthy eating habits  Depression with anxiety:  continue wellbutrin, encouraged more time out on the patio as tolerated with humidity  COPD with hypoxia:  continue oxygen, singulair, albuterol, trelegy  HTN:  bp up today,  did eat hint of sea salt triscuits before I came, follow bp 3 times this week and let me know if still staying over 902 systolic, also has not been taking her diuretic while on jardiance so may need to add one back daily  Follow up Palliative Care Visit: Palliative care will continue to follow for complex medical decision making, advance care planning, and clarification of goals. Return 01/26/2022 And prn.   This visit was coded based on medical decision making (MDM).  PPS: 50%  HOSPICE ELIGIBILITY/DIAGNOSIS: TBD  Chief Complaint: Follow-up palliative visit  HISTORY OF PRESENT ILLNESS:  [name] is a 66 y.o. year old female  with multiple medical conditions who is essentially homebound due to her COPD and respiratory failure .   March she weighed 229 lbs.  Weighed 230 lbs yesterday but felt swollen.  Has not taken any fluid pills.  Gets cramps if she takes one diuretic at night unless feet visibly.  Sats maintaining and no more sob than usual.  Using 2L via [place].    Notes scaly skin that peels off improved when she was w/o jardiance  a few days, but is getting bad again.  Breathing worse in past few days.  She's felt cold.  Didn't have any pep this weekend.  Gets up at 530/6 and takes meds early.  Then she doesn't hurt as much when she gets up, but bothered her the last three days.    Her back is hurting her so much--awful if she tries to cook meal standing at the counter.  Son helps her sometimes or has to prep for in oven or crockpot.  Hurts from time she wakes up until she goes to bed.  She had shrunken an inch and had bone density was done.  She'd lost only 1/8 in not a full in.  Uses mobic.  Doesn't last all day.       She's getting a new walk-in shower that will not have the little things to sit on in the corner that she can't sit on.  She can get in the tub but not out.  This one has a large bench with jets on the back.  It was made special for someone else who passed away before it could be installed.  Art is supposed to be getting some VA benefits to help put it in.  Apparently, it will be fully covered.    History obtained from review of EMR, discussion with primary team, and interview with family, facility staff/caregiver and/or [name].   I reviewed available labs, medications, imaging, studies and related documents from the EMR.  Records reviewed and summarized above.   ROS Review of Systems  Constitutional:  Positive for fatigue. Negative for activity change, appetite change, chills and fever.  HENT:  Negative for hearing loss.   Eyes:  Negative for visual disturbance.  Respiratory:  Positive for shortness of breath. Negative for cough and wheezing.   Cardiovascular:  Positive for leg swelling. Negative for chest pain.       Edema improved  Gastrointestinal:  Negative for constipation.  Genitourinary:  Negative for dysuria.  Musculoskeletal:  Positive for arthralgias and back pain.  Skin:  Negative for color change.  Neurological:  Negative for dizziness.  Hematological:  Bruises/bleeds easily.  Psychiatric/Behavioral:  Positive for dysphoric mood. Negative for sleep disturbance.     Physical Exam: There were no vitals filed for this visit. There is no height or weight on file to calculate BMI. Wt Readings from Last 500 Encounters:  07/28/21 229 lb (103.9 kg)  01/11/20 244 lb (110.7 kg)  12/04/19 (!) 244 lb 12.8 oz (111 kg)  01/08/19 229 lb (103.9 kg)  11/06/18 229 lb (103.9 kg)  10/09/18 220 lb (99.8 kg)  09/04/18 220 lb (99.8 kg)  06/02/18 220 lb 12.8 oz (100.2 kg)  04/27/18 226 lb 9.6 oz (102.8 kg)   Physical Exam Constitutional:      Appearance: Normal appearance. She  is obese. She is not toxic-appearing.  HENT:     Head: Normocephalic and atraumatic.     Right Ear: External ear normal.     Left Ear: External ear normal.  Eyes:     Conjunctiva/sclera: Conjunctivae normal.     Pupils: Pupils are equal, round, and reactive to light.  Cardiovascular:     Rate and Rhythm: Normal rate and regular rhythm.     Pulses: Normal pulses.     Heart sounds: Normal heart sounds.  Pulmonary:     Effort: Pulmonary effort is normal.     Breath sounds: No wheezing.  Abdominal:     General:  Bowel sounds are normal.     Palpations: Abdomen is soft.     Tenderness: There is no abdominal tenderness.  Musculoskeletal:        General: Normal range of motion.     Right lower leg: No edema.     Left lower leg: No edema.  Skin:    General: Skin is warm and dry.     Comments: Chronic venous stasis changes; dry scaly skin  Neurological:     General: No focal deficit present.     Mental Status: She is alert and oriented to person, place, and time.  Psychiatric:        Mood and Affect: Mood normal.     CURRENT PROBLEM LIST:  Patient Active Problem List   Diagnosis Date Noted   Dupuytren's contracture of right hand 09/27/2019   Dysthymic disorder 10/10/2018   Polycythemia 09/15/2018   Vitamin D deficiency 09/09/2018   Adult ADHD 07/03/2018   GAD (generalized anxiety disorder) 07/03/2018   Mild episode of recurrent major depressive disorder ([place]) 07/03/2018   Centrilobular emphysema ([place]) 06/02/2018   OSA (obstructive sleep apnea) 06/02/2018   Pulmonary nodule 05/01/2018   Obesity (BMI 30-39.9)    Chronic respiratory failure with hypoxia ([place]) 04/22/2018   Chronic right-sided CHF (congestive heart failure) ([place]) 04/22/2018   PAH (pulmonary artery hypertension) ([place]) 04/22/2018   Benzodiazepine dependence, continuous ([place]) 02/22/2018   Serrated adenoma of colon 08/01/2017   Non-seasonal allergic rhinitis due to pollen 08/16/2016   Non-insulin dependent type 2  diabetes mellitus ([place]) 07/19/2016   Hyperlipidemia associated with type 2 diabetes mellitus ([place]) 07/19/2016   Restless leg syndrome 01/07/2011   Mild persistent asthma 01/07/2011   Hiatal hernia 07/28/2006   Collagenous colitis 07/28/2006    PAST MEDICAL HISTORY:  Active Ambulatory Problems    Diagnosis Date Noted   Chronic respiratory failure with hypoxia ([place]) 04/22/2018   Chronic right-sided CHF (congestive heart failure) ([place]) 04/22/2018   PAH (pulmonary artery hypertension) ([place]) 04/22/2018   Obesity (BMI 30-39.9)    Centrilobular emphysema ([place]) 06/02/2018   OSA (obstructive sleep apnea) 06/02/2018   Adult ADHD 07/03/2018   GAD (generalized anxiety disorder) 07/03/2018   Mild episode of recurrent major depressive disorder ([place]) 07/03/2018   Non-insulin dependent type 2 diabetes mellitus ([place]) 07/19/2016   Serrated adenoma of colon 08/01/2017   Restless leg syndrome 01/07/2011   Pulmonary nodule 05/01/2018   Non-seasonal allergic rhinitis due to pollen 08/16/2016   Mild persistent asthma 01/07/2011   Hyperlipidemia associated with type 2 diabetes mellitus ([place]) 07/19/2016   Hiatal hernia 07/28/2006   Collagenous colitis 07/28/2006   Benzodiazepine dependence, continuous ([place]) 02/22/2018   Vitamin D deficiency 09/09/2018   Polycythemia 09/15/2018   Dysthymic disorder 10/10/2018   Dupuytren's contracture of right hand 09/27/2019   Resolved Ambulatory Problems    Diagnosis Date Noted   Hypochloremic alkalosis 04/22/2018   Prerenal azotemia 04/22/2018   Pulmonary hypertension (HCC)    Congestive heart failure ([place])    Hypoxia    Positive blood culture    Major depression, recurrent, full remission ([place]) 09/20/2011   ADHD (attention deficit hyperactivity disorder) 06/29/2012   Past Medical History:  Diagnosis Date   ADHD    Depression    GERD (gastroesophageal reflux disease)    Migraine     SOCIAL HX:  Social History   Tobacco Use   Smoking status: Former     Packs/day: 0.50    Types: Cigarettes   Smokeless tobacco: Never  Substance Use Topics   Alcohol use: No     ALLERGIES:  Allergies  Allergen Reactions   Asa [Aspirin] Nausea Only    abd pain   Codeine Nausea Only   Sulfa Antibiotics Hives      PERTINENT MEDICATIONS:  Outpatient Encounter Medications as of 10/27/2021  Medication Sig   albuterol (PROVENTIL HFA;VENTOLIN HFA) 108 (90 Base) MCG/ACT inhaler INHALE 2 PUFFS INTO THE LUNGS EVERY 6 HOURS AS NEEDED   amphetamine-dextroamphetamine (ADDERALL) 10 MG tablet Take two tablets in AM and one at noon   atorvastatin (LIPITOR) 80 MG tablet Take 1 tablet (80 mg total) by mouth daily. Pt will need an appointment for additional refills   buPROPion (WELLBUTRIN XL) 300 MG 24 hr tablet Take 1 tablet (300 mg total) by mouth daily.   empagliflozin (JARDIANCE) 25 MG TABS tablet Take 1 tablet (25 mg total) by mouth daily before breakfast.   Magnesium Oxide 400 MG CAPS Take 1 capsule (400 mg total) by mouth daily.   montelukast (SINGULAIR) 10 MG tablet TAKE 1 TABLET BY MOUTH EVERY DAY   polyethylene glycol powder (GLYCOLAX/MIRALAX) 17 GM/SCOOP powder MIX 17 GRAMS WITH LIQUID AND TAKE  PO D PRN FOR MILD CONSTIPATION   potassium chloride (KLOR-CON) 10 MEQ tablet TAKE 1 TABLET BY MOUTH EVERY DAY   torsemide (DEMADEX) 20 MG tablet Take 1 tablet (20 mg total) by mouth daily as needed (for swelling not relieved by jardiance).   No facility-administered encounter medications on file as of 10/27/2021.    Thank you for the opportunity to participate in the care of Ms. Chiem.  The palliative care team will continue to follow. Please call our office at [phone] if we can be of additional assistance.   Hollace Kinnier, DO  COVID-19 PATIENT SCREENING TOOL Asked and negative response unless otherwise noted:  Have you had symptoms of covid, tested positive or been in contact with someone with symptoms/positive test in the past 5-10 days? no

## 2021-11-09 ENCOUNTER — Encounter: Payer: Self-pay | Admitting: Internal Medicine

## 2021-12-16 ENCOUNTER — Encounter (INDEPENDENT_AMBULATORY_CARE_PROVIDER_SITE_OTHER): Payer: Self-pay

## 2022-01-26 ENCOUNTER — Encounter: Payer: Self-pay | Admitting: Internal Medicine

## 2022-01-26 ENCOUNTER — Other Ambulatory Visit: Payer: No Typology Code available for payment source | Admitting: Internal Medicine

## 2022-01-26 VITALS — BP 143/74 | HR 84 | Temp 98.1°F | Resp 18 | Wt 230.0 lb

## 2022-01-26 DIAGNOSIS — E1169 Type 2 diabetes mellitus with other specified complication: Secondary | ICD-10-CM

## 2022-01-26 DIAGNOSIS — F909 Attention-deficit hyperactivity disorder, unspecified type: Secondary | ICD-10-CM

## 2022-01-26 DIAGNOSIS — I10 Essential (primary) hypertension: Secondary | ICD-10-CM

## 2022-01-26 DIAGNOSIS — L853 Xerosis cutis: Secondary | ICD-10-CM

## 2022-01-26 DIAGNOSIS — J432 Centrilobular emphysema: Secondary | ICD-10-CM

## 2022-01-26 MED ORDER — TRIAMCINOLONE ACETONIDE 0.5 % EX CREA: 1.0000 | TOPICAL_CREAM | Freq: Three times a day (TID) | CUTANEOUS | 0 refills | Status: AC

## 2022-01-26 NOTE — Progress Notes (Signed)
[name]: [phone]  Fax: [phone]   Date of encounter: 02/06/22 2:46 PM PATIENT NAME: [name] [id number]   [phone] (home)  DOB: [birth year] MRN: [id number] PRIMARY CARE PROVIDER:    me  RESPONSIBLE PARTY:    Contact Information     Name Relation Home Work [place] Son   [phone]   [place],Art Other [phone]  [phone]                                          ASSESSMENT AND PLAN / RECOMMENDATIONS:   Advance Care Planning/Goals of Care: Goals include to maximize quality of life and symptom management. Patient/health care surrogate gave his/her permission to discuss.Our advance care planning conversation included a discussion about:    The value and importance of advance care planning  Experiences with loved ones who have been seriously ill or have died  Exploration of personal, cultural or spiritual beliefs that might influence medical decisions  Exploration of goals of care in the event of a sudden injury or illness  Identification  of a healthcare agent  Review and updating or creation of an  advance directive document . Decision not to resuscitate or to de-escalate disease focused treatments due to poor prognosis. CODE STATUS:  full code  Symptom Management/Plan: 1. Centrilobular emphysema (HCC) Use albuterol inhaler before shower Continue working with [facility] to get walk-in shower Cont same regimen Recommend pulmonary f/u  2. Type 2 diabetes mellitus with other specified complication, without long-term current use of insulin (HCC) -well controlled with jardiance added -lost weight, but now stabilized  3. Hyperlipidemia associated with type 2 diabetes mellitus ([place]) -lipids have been at goal, cont same regimen and monitor  4. Adult ADHD -has not done well if she has only 2 adderall per day, requires three as has been  prescribed this entire time and tolerated  5. Essential hypertension -bp elevated slightly today, may need adjustment if remaining over 140 but is not typically when she takes on own at home (white coat)  6. Xerosis cutis -continue regular moisturizing, use of antifungal steroid on lower legs     Follow up Palliative Care Visit: Palliative care will continue to follow for complex medical decision making, advance care planning, and clarification of goals. Return 04/20/2022  weeks or prn.  This visit was coded based on medical decision making (MDM).  PPS: 50%  HOSPICE ELIGIBILITY/DIAGNOSIS: TBD  Chief Complaint: Follow-up palliative visit  HISTORY OF PRESENT ILLNESS:  [name] is a 66 y.o. year old female  with COPD with chronic respiratory failure, morbid obesity, diabetes, htn, hyperlipidemia among others seen for palliative f/u and med mgt as she has no other pcp .   [place] is having difficulty getting her oxygen paid for from last year.  Reportedly, the insurance she had then told her they would cover it.  Then when she  agreed to pay adapt $51, then actually charged her over $600.  The would not refund her money b/c they said she owed it.  She tried calling [employer] and they said that medicare will not cover oxygen b/w 4-6 years out of having O2.  She has never used the refilling device on the oxygen machine and keeps getting charged for it.    Using 2.5 to 3L--uses 3L  when showers.  She uses albuterol once every couple of days.  Has used her nebs 2x in the past three months.    Maddilyn has been trying to get her husband to sign up with the [facility] so they would help pay for a walk-in tub.    Her weight has remained the same.  She eats very little bread.  Eats just one meal per day.  No snacks unless fig newtons are in the house.    She's still taking her jardiance.  She thinks it is killing her skin.  She rubbed her leg and it broke open.  Says her little skin spots have worsened  since being on it.  She does moisturize regularly.      She finally got her a chair she could afford, but now it hurts her back so she got a pillow for her lower back which should arrive today.  Recommended going to pulmonary again.  She says she can't get past getting dressed to go to the car.      Adderall is helping.  She says she is a mess w/o it.    Bowels are moving regularly.  She says she had gone weekly all of her life until more recently--now daily to bid bms.  Says she's eating less than ever and doesn't know where it comes from.    Did take torsemide several days in a row about a month ago, but she got a cramp in the middle of the night in her ankle.  She got very winded.  She used to be on three torsemide ('60mg'$ ) daily.  She did not take potassium b/c she only took that when on the '60mg'$  daily.  Asks if she can take too much magnesium--yes.  Has had burning pain of elbow.  She says she has gotten periods of various parts being numb on the opposite side from what she is resting on.  Has been for a long time.    She says she will be sitting doing nothing and suddenly get a sinking feeling like she had ALL of her energy taken away.    History obtained from review of EMR, discussion with primary team, and interview with family, facility staff/caregiver and/or Ms. Saiki.  I reviewed available labs, medications, imaging, studies and related documents from the EMR.  Records reviewed and summarized above.   ROS Review of Systems see hpi and a/p  Physical Exam: Vitals:   01/26/22 1215  BP: (!) 143/74  Pulse: 84  Resp: 18  Temp: 98.1 F (36.7 C)  SpO2: 90%  Weight: 230 lb (104.3 kg)   Body mass index is 36.02 kg/m. Wt Readings from Last 500 Encounters:  01/26/22 230 lb (104.3 kg)  10/27/21 230 lb (104.3 kg)  07/28/21 229 lb (103.9 kg)  01/11/20 244 lb (110.7 kg)  12/04/19 (!) 244 lb 12.8 oz (111 kg)  01/08/19 229 lb (103.9 kg)  11/06/18 229 lb (103.9 kg)  10/09/18 220 lb  (99.8 kg)  09/04/18 220 lb (99.8 kg)  06/02/18 220 lb 12.8 oz (100.2 kg)  04/27/18 226 lb 9.6 oz (102.8 kg)   Physical Exam Constitutional:      Appearance: Normal appearance. She is obese.  Cardiovascular:     Rate and Rhythm: Normal rate and regular rhythm.  Pulmonary:     Effort: Pulmonary effort is normal. No respiratory distress.     Breath sounds: Wheezing present. No rhonchi.  Abdominal:     General: Bowel sounds are  normal.     Palpations: Abdomen is soft.     Tenderness: There is no abdominal tenderness. There is no guarding or rebound.  Musculoskeletal:        General: Normal range of motion.     Right lower leg: No edema.     Left lower leg: No edema.  Skin:    General: Skin is warm and dry.     Comments: Dry scaly patches on legs, purple feet and lower legs  Neurological:     General: No focal deficit present.     Mental Status: She is alert and oriented to person, place, and time.  Psychiatric:        Mood and Affect: Mood normal.        Behavior: Behavior normal.        Thought Content: Thought content normal.        Judgment: Judgment normal.     CURRENT PROBLEM LIST:  Patient Active Problem List   Diagnosis Date Noted   Dupuytren's contracture of right hand 09/27/2019   Dysthymic disorder 10/10/2018   Polycythemia 09/15/2018   Vitamin D deficiency 09/09/2018   Adult ADHD 07/03/2018   GAD (generalized anxiety disorder) 07/03/2018   Mild episode of recurrent major depressive disorder ([place]) 07/03/2018   Centrilobular emphysema ([place]) 06/02/2018   OSA (obstructive sleep apnea) 06/02/2018   Pulmonary nodule 05/01/2018   Obesity (BMI 30-39.9)    Chronic respiratory failure with hypoxia ([place]) 04/22/2018   Chronic right-sided CHF (congestive heart failure) ([place]) 04/22/2018   PAH (pulmonary artery hypertension) ([place]) 04/22/2018   Benzodiazepine dependence, continuous ([place]) 02/22/2018   Serrated adenoma of colon 08/01/2017   Non-seasonal allergic rhinitis  due to pollen 08/16/2016   Non-insulin dependent type 2 diabetes mellitus ([place]) 07/19/2016   Hyperlipidemia associated with type 2 diabetes mellitus ([place]) 07/19/2016   Restless leg syndrome 01/07/2011   Mild persistent asthma 01/07/2011   Hiatal hernia 07/28/2006   Collagenous colitis 07/28/2006    PAST MEDICAL HISTORY:  Active Ambulatory Problems    Diagnosis Date Noted   Chronic respiratory failure with hypoxia ([place]) 04/22/2018   Chronic right-sided CHF (congestive heart failure) ([place]) 04/22/2018   PAH (pulmonary artery hypertension) ([place]) 04/22/2018   Obesity (BMI 30-39.9)    Centrilobular emphysema ([place]) 06/02/2018   OSA (obstructive sleep apnea) 06/02/2018   Adult ADHD 07/03/2018   GAD (generalized anxiety disorder) 07/03/2018   Mild episode of recurrent major depressive disorder ([place]) 07/03/2018   Non-insulin dependent type 2 diabetes mellitus ([place]) 07/19/2016   Serrated adenoma of colon 08/01/2017   Restless leg syndrome 01/07/2011   Pulmonary nodule 05/01/2018   Non-seasonal allergic rhinitis due to pollen 08/16/2016   Mild persistent asthma 01/07/2011   Hyperlipidemia associated with type 2 diabetes mellitus ([place]) 07/19/2016   Hiatal hernia 07/28/2006   Collagenous colitis 07/28/2006   Benzodiazepine dependence, continuous ([place]) 02/22/2018   Vitamin D deficiency 09/09/2018   Polycythemia 09/15/2018   Dysthymic disorder 10/10/2018   Dupuytren's contracture of right hand 09/27/2019   Resolved Ambulatory Problems    Diagnosis Date Noted   Hypochloremic alkalosis 04/22/2018   Prerenal azotemia 04/22/2018   Pulmonary hypertension (HCC)    Congestive heart failure ([place])    Hypoxia    Positive blood culture    Major depression, recurrent, full remission ([place]) 09/20/2011   ADHD (attention deficit hyperactivity disorder) 06/29/2012   Past Medical History:  Diagnosis Date   ADHD    Depression    GERD (gastroesophageal reflux disease)  Migraine     SOCIAL HX:   Social History   Tobacco Use   Smoking status: Former    Packs/day: 0.50    Types: Cigarettes   Smokeless tobacco: Never  Substance Use Topics   Alcohol use: No     ALLERGIES:  Allergies  Allergen Reactions   Asa [Aspirin] Nausea Only    abd pain   Codeine Nausea Only   Sulfa Antibiotics Hives      PERTINENT MEDICATIONS:  Outpatient Encounter Medications as of 01/26/2022  Medication Sig   triamcinolone cream (KENALOG) 0.5 % Apply 1 Application topically 3 (three) times daily.   albuterol (PROVENTIL HFA;VENTOLIN HFA) 108 (90 Base) MCG/ACT inhaler INHALE 2 PUFFS INTO THE LUNGS EVERY 6 HOURS AS NEEDED   ALPRAZolam (XANAX) 0.5 MG tablet Take 0.5 mg by mouth 2 (two) times daily as needed.   amphetamine-dextroamphetamine (ADDERALL) 10 MG tablet Take two tablets in AM and one at noon   atorvastatin (LIPITOR) 80 MG tablet Take 1 tablet (80 mg total) by mouth daily. Pt will need an appointment for additional refills   buPROPion (WELLBUTRIN XL) 300 MG 24 hr tablet Take 1 tablet (300 mg total) by mouth daily.   clotrimazole-betamethasone (LOTRISONE) lotion Apply topically 2 (two) times daily.   empagliflozin (JARDIANCE) 25 MG TABS tablet Take 1 tablet (25 mg total) by mouth daily before breakfast.   gabapentin (NEURONTIN) 100 MG capsule Take 100 mg by mouth at bedtime.   Magnesium Oxide 400 MG CAPS Take 1 capsule (400 mg total) by mouth daily.   meloxicam (MOBIC) 15 MG tablet Take 15 mg by mouth daily.   montelukast (SINGULAIR) 10 MG tablet TAKE 1 TABLET BY MOUTH EVERY DAY   polyethylene glycol powder (GLYCOLAX/MIRALAX) 17 GM/SCOOP powder MIX 17 GRAMS WITH LIQUID AND TAKE  PO D PRN FOR MILD CONSTIPATION   potassium chloride (KLOR-CON) 10 MEQ tablet TAKE 1 TABLET BY MOUTH EVERY DAY   torsemide (DEMADEX) 20 MG tablet Take 1 tablet (20 mg total) by mouth daily as needed (for swelling not relieved by jardiance).   No facility-administered encounter medications on file as of 01/26/2022.     Thank you for the opportunity to participate in the care of Ms. Ferrebee.  The palliative care team will continue to follow. Please call our office at [phone] if we can be of additional assistance.   Hollace Kinnier, DO  COVID-19 PATIENT SCREENING TOOL Asked and negative response unless otherwise noted:  Have you had symptoms of covid, tested positive or been in contact with someone with symptoms/positive test in the past 5-10 days? No

## 2022-01-29 ENCOUNTER — Telehealth: Payer: Self-pay

## 2022-01-29 NOTE — Telephone Encounter (Signed)
1057 am.  Return call made to [facility] to address triamcinolone cream.  Triamcinolone 0.1% okayed per Dr. Hollace Kinnier.

## 2022-04-20 ENCOUNTER — Other Ambulatory Visit: Payer: No Typology Code available for payment source | Admitting: Internal Medicine

## 2024-05-14 ENCOUNTER — Ambulatory Visit (HOSPITAL_COMMUNITY)
Admission: RE | Admit: 2024-05-14 | Discharge: 2024-05-14 | Disposition: A | Discharge status: Home / Self Care | Source: Ambulatory Visit | Attending: Internal Medicine | Admitting: Internal Medicine

## 2024-05-14 VITALS — BP 128/68 | HR 90 | Wt 195.6 lb

## 2024-05-14 DIAGNOSIS — J9612 Chronic respiratory failure with hypercapnia: Secondary | ICD-10-CM | POA: Insufficient documentation

## 2024-05-14 DIAGNOSIS — I2781 Cor pulmonale (chronic): Secondary | ICD-10-CM | POA: Diagnosis not present

## 2024-05-14 DIAGNOSIS — Z7984 Long term (current) use of oral hypoglycemic drugs: Secondary | ICD-10-CM | POA: Diagnosis not present

## 2024-05-14 DIAGNOSIS — J9611 Chronic respiratory failure with hypoxia: Secondary | ICD-10-CM | POA: Insufficient documentation

## 2024-05-14 DIAGNOSIS — I2721 Secondary pulmonary arterial hypertension: Secondary | ICD-10-CM

## 2024-05-14 DIAGNOSIS — E119 Type 2 diabetes mellitus without complications: Secondary | ICD-10-CM | POA: Insufficient documentation

## 2024-05-14 DIAGNOSIS — Z6830 Body mass index (BMI) 30.0-30.9, adult: Secondary | ICD-10-CM | POA: Insufficient documentation

## 2024-05-14 DIAGNOSIS — J449 Chronic obstructive pulmonary disease, unspecified: Secondary | ICD-10-CM | POA: Diagnosis not present

## 2024-05-14 DIAGNOSIS — Z87891 Personal history of nicotine dependence: Secondary | ICD-10-CM | POA: Insufficient documentation

## 2024-05-14 DIAGNOSIS — E662 Morbid (severe) obesity with alveolar hypoventilation: Secondary | ICD-10-CM | POA: Diagnosis not present

## 2024-05-14 DIAGNOSIS — I50812 Chronic right heart failure: Secondary | ICD-10-CM | POA: Diagnosis present

## 2024-05-14 LAB — COMPREHENSIVE METABOLIC PANEL WITH GFR
ALT: 26 U/L (ref 0–44)
AST: 33 U/L (ref 15–41)
Albumin: 4.1 g/dL (ref 3.5–5.0)
Alkaline Phosphatase: 96 U/L (ref 38–126)
Anion gap: 9 (ref 5–15)
BUN: 24 mg/dL — ABNORMAL HIGH (ref 8–23)
CO2: 36 mmol/L — ABNORMAL HIGH (ref 22–32)
Calcium: 9.7 mg/dL (ref 8.9–10.3)
Chloride: 95 mmol/L — ABNORMAL LOW (ref 98–111)
Creatinine, Ser: 0.9 mg/dL (ref 0.44–1.00)
GFR, Estimated: >60 mL/min
Glucose, Bld: 104 mg/dL — ABNORMAL HIGH (ref 70–99)
Potassium: 4.6 mmol/L (ref 3.5–5.1)
Sodium: 141 mmol/L (ref 135–145)
Total Bilirubin: 0.7 mg/dL (ref 0.0–1.2)
Total Protein: 7.4 g/dL (ref 6.5–8.1)

## 2024-05-14 LAB — PRO BRAIN NATRIURETIC PEPTIDE: Pro Brain Natriuretic Peptide: 166 pg/mL

## 2024-05-14 NOTE — Progress Notes (Signed)
 "  ADVANCED HF CLINIC CONSULT NOTE  Referring Physician: Pcp, No Primary Care: Pcp, No Primary Cardiologist: Lonni LITTIE Nanas, MD  Chief Complaint: Pulmonary HTN  HPI:   [name] is a 69 y.o. female with history of  tobacco abuse, morbid obesity, pulmonary HTN, DM2, ADHD referred for further evaluation of PH.   I saw her for the first time when she presented to [facility] 04/22/18 with HF symptoms.  TTE 04/23/2018 showed LVEF 60 to 65%, grade 1 diastolic dysfunction, mild RV dilatation, PASP 58 mmHg.  RHC on 04/25/2018 showed RA 13, RV 75/14, PA 75/33/47, PW 23, PA sat 73%, CI 2.7.  Suspected mixed pulmonary hypertension with combination of group 2 (diastolic heart failure), group 3 (hypoxic lung disease) and high output due to lung disease/obesity.   Subsequently los to f/u   Admitted to [facility] in10/25 with acute on chronic Hypoxic/Hypercapnic Respiratory Failure felt due to COPD and RV failure. CTA negative for PE. Treated with BIPAP and diuresed 40 pounds.   Echo EF 55-60% RV mildly reduced   ABG on admission with pH 7.262, pCO2 89.7, pO2 63.1, HCO3 40 Repeat ABG with pH 7.370, pCO2 74.5, pO2 88, HCO3 43.   Here with her son to re-establish care for her. Says she has started a noninvasive ventilator and feels much better. Wears O2 at 2L most of the time. Follows with Pulmonary (Dr. Sheralyn) Can do ADLs without too much trouble. No syncope or presyncope. Edema controlled. Quit smoking 2014    Outside studies VQ scan 05/23/2017 ([facility])- Low probability of PE. Echo 05/23/17 ([facility]) - Not available. Per Note 04/17/18 -> Normal LV function. 'Severe' pulmonary HTN Echo 05/2015 ([facility]) - EF 60-65%, mild TR, Right Ventricular systolic pressure 50-60 mmHg Normal RV size and function.    Past Medical History:  Diagnosis Date   ADHD    Chronic right-sided CHF (congestive heart failure) (HCC)    Depression    GERD (gastroesophageal reflux disease)    Migraine    PAH (pulmonary  artery hypertension) (HCC)    Restless leg syndrome     Current Outpatient Medications  Medication Sig Dispense Refill   albuterol  (PROVENTIL  HFA;VENTOLIN  HFA) 108 (90 Base) MCG/ACT inhaler INHALE 2 PUFFS INTO THE LUNGS EVERY 6 HOURS AS NEEDED     ALPRAZolam  (XANAX ) 0.5 MG tablet Take 0.5 mg by mouth 2 (two) times daily as needed.     amphetamine -dextroamphetamine  (ADDERALL) 10 MG tablet Take two tablets in AM and one at noon 90 tablet 0   atorvastatin  (LIPITOR) 80 MG tablet Take 1 tablet (80 mg total) by mouth daily. Pt will need an appointment for additional refills 90 tablet 0   clotrimazole-betamethasone (LOTRISONE) lotion Apply topically 2 (two) times daily.     empagliflozin  (JARDIANCE ) 25 MG TABS tablet Take 1 tablet (25 mg total) by mouth daily before breakfast. 90 tablet 3   gabapentin (NEURONTIN) 100 MG capsule Take 100 mg by mouth at bedtime.     Magnesium  Oxide 400 MG CAPS Take 1 capsule (400 mg total) by mouth daily. 30 capsule 0   meloxicam (MOBIC) 15 MG tablet Take 15 mg by mouth daily.     montelukast  (SINGULAIR ) 10 MG tablet TAKE 1 TABLET BY MOUTH EVERY DAY 90 tablet 1   polyethylene glycol powder (GLYCOLAX /MIRALAX ) 17 GM/SCOOP powder MIX 17 GRAMS WITH LIQUID AND TAKE  PO D PRN FOR MILD CONSTIPATION     torsemide  (DEMADEX ) 20 MG tablet Take 1 tablet (20 mg total) by  mouth daily as needed (for swelling not relieved by jardiance ). 90 tablet 3   triamcinolone  cream (KENALOG ) 0.5 % Apply 1 Application topically 3 (three) times daily. 454 g 0   potassium chloride  (KLOR-CON ) 10 MEQ tablet TAKE 1 TABLET BY MOUTH EVERY DAY (Patient not taking: Reported on 05/14/2024) 90 tablet 1   No current facility-administered medications for this encounter.    Allergies[1]    Social History   Socioeconomic History   Marital status: Married    Spouse name: Leisure Centre Manager   Number of children: 1   Years of education: Not on file   Highest education level: Some college, no degree  Occupational  History    Employer: [employer]  Tobacco Use   Smoking status: Former    Current packs/day: 0.50    Types: Cigarettes   Smokeless tobacco: Never  Vaping Use   Vaping status: Never Used  Substance and Sexual Activity   Alcohol use: No   Drug use: No   Sexual activity: Not Currently  Other Topics Concern   Not on file  Social History Narrative   Not on file   Social Drivers of Health   Tobacco Use: Medium Risk (04/30/2024)   Received from [facility]   Patient History    Smoking Tobacco Use: Former    Smokeless Tobacco Use: Never    Passive Exposure: Not on file  Financial Resource Strain: Medium Risk (03/17/2024)   Received from [facility]   Overall Financial Resource Strain (CARDIA)    How hard is it for you to pay for the very basics like food, housing, medical care, and heating?: Somewhat hard  Food Insecurity: No Food Insecurity (03/17/2024)   Received from [facility]   Epic    Within the past 12 months, you worried that your food would run out before you got the money to buy more.: Never true    Within the past 12 months, the food you bought just didn't last and you didn't have money to get more.: Never true  Transportation Needs: No Transportation Needs (03/17/2024)   Received from [facility]    In the past 12 months, has lack of transportation kept you from medical appointments or from getting medications?: No    In the past 12 months, has lack of transportation kept you from meetings, work, or from getting things needed for daily living?: No  Physical Activity: Inactive (03/17/2024)   Received from [facility]   Exercise Vital Sign    On average, how many days per week do you engage in moderate to strenuous exercise (like a brisk walk)?: 0 days    Minutes of Exercise per Session: Not on file  Stress: Stress Concern Present (03/17/2024)   Received from [facility] - Occupational Stress Questionnaire     Do you feel stress - tense, restless, nervous, or anxious, or unable to sleep at night because your mind is troubled all the time - these days?: To some extent  Social Connections: Somewhat Isolated (03/17/2024)   Received from [facility]   Social Network    How would you rate your social network (family, work, friends)?: Restricted participation with some degree of social isolation  Intimate Partner Violence: Not At Risk (03/17/2024)   Received from [facility]   HITS    Over the last 12 months how often did your partner physically hurt you?: Never    Over the last 12  months how often did your partner insult you or talk down to you?: Never    Over the last 12 months how often did your partner threaten you with physical harm?: Never    Over the last 12 months how often did your partner scream or curse at you?: Never  Depression (PHQ2-9): Not on file  Alcohol Screen: Not on file  Housing: Low Risk (03/17/2024)   Received from [facility]    In the last 12 months, was there a time when you were not able to pay the mortgage or rent on time?: No    In the past 12 months, how many times have you moved where you were living?: 0    At any time in the past 12 months, were you homeless or living in a shelter (including now)?: No  Utilities: Not At Risk (03/17/2024)   Received from [facility]    In the past 12 months has the electric, gas, oil, or water company threatened to shut off services in your home?: No  Health Literacy: Not on file      Family History  Problem Relation Age of Onset   Alcohol abuse Sister    Drug abuse Sister    Mental illness Sister    Early death Sister    Pancreatic cancer Mother    Emphysema Father     Vitals:   05/14/24 1200  BP: 128/68  Pulse: 90  SpO2: 98%  Weight: 88.7 kg (195 lb 9.6 oz)    PHYSICAL EXAM: General:  Obese woman. No respiratory difficulty HEENT: normal Neck: supple. no JVD. Carotids 2+ bilat; no bruits. No  lymphadenopathy or thryomegaly appreciated. Cor: PMI nondisplaced. Regular rate & rhythm. No rubs, gallops or murmurs. Lungs: decreased throughout Abdomen: obese soft, nontender, nondistended. No hepatosplenomegaly. No bruits or masses. Good bowel sounds. Extremities: no cyanosis, clubbing, rash, tr edema Neuro: alert & oriented x 3, cranial nerves grossly intact. moves all 4 extremities w/o difficulty. Affect pleasant.   ASSESSMENT & PLAN:  1. Pulmonary Hypertension with cor pulmonale  - RHC on 04/25/2018 showed RA 13, RV 75/14, PA 75/33/47, PW 23, PA sat 73%, CI 2.7.  - Suspect WHO group III disease in setting of OSA/OHS and COPD - Serologies negative - Symptomatically much improved with diuresis and initiation of home ventilator -.Refer pulmonary rehab - Stressed need to follow sats and keep >= 88%.  - check hall walk today - repeat echo - not candidate for selective pulmonary vasodilators with WHO Group 3 disease   2. Chronic hypercarbic/hypoxic resp failure due to OSA/OHS - Follows with Pulmonary at [facility] - Continue noninvasive ventilator at night   3. Obesity - Body mass index is 30.64 kg/m. - Encouraged weight loss   4. DM2 - Per primary   5. Tobacco abuse - Remains quit   Toribio Fuel, MD  12:57 PM      [1]  Allergies Allergen Reactions   Dorethia Sinner ] Nausea Only    abd pain   Codeine Nausea Only   Sulfa Antibiotics Hives   "

## 2024-05-14 NOTE — Patient Instructions (Addendum)
 Medication Changes:  None, continue current medications  Lab Work:  Labs done today, your results will be available in MyChart, we will contact you for abnormal readings.  Testing/Procedures:  Your physician has requested that you have an echocardiogram. Echocardiography is a painless test that uses sound waves to create images of your heart. It provides your doctor with information about the size and shape of your heart and how well your hearts chambers and valves are working. This procedure takes approximately one hour. There are no restrictions for this procedure. Please do NOT wear cologne, perfume, aftershave, or lotions (deodorant is allowed). Please arrive 15 minutes prior to your appointment time.  Please note: We ask at that you not bring children with you during ultrasound (echo/ vascular) testing. Due to room size and safety concerns, children are not allowed in the ultrasound rooms during exams. Our front office staff cannot provide observation of children in our lobby area while testing is being conducted. An adult accompanying a patient to their appointment will only be allowed in the ultrasound room at the discretion of the ultrasound technician under special circumstances. We apologize for any inconvenience.  Referrals:  You have been referred to Pulmonary Rehab, they will call you to schedule this  Special Instructions // Education:  Do the following things EVERYDAY: Weigh yourself in the morning before breakfast. Write it down and keep it in a log. Take your medicines as prescribed Eat low salt foods--Limit salt (sodium) to 2000 mg per day.  Stay as active as you can everyday Limit all fluids for the day to less than 2 liters   Follow-Up in: 6 months (July 2026), *PLEASE CALL OUR OFFICE IN MAY TO SCHEDULE THIS APPOINTMENT   At the [facility], you and your health needs are our priority. We have a designated team specialized in the treatment of  Heart Failure. This Care Team includes your primary Heart Failure Specialized Cardiologist (physician), Advanced Practice Providers (APPs- Physician Assistants and Nurse Practitioners), and Pharmacist who all work together to provide you with the care you need, when you need it.   You may see any of the following providers on your designated Care Team at your next follow up:  Dr. Toribio Fuel Dr. Ezra Shuck Dr. Odis Brownie Greig Mosses, NP Caffie Shed, [place] [facility], [place] Beckey Coe, NP Jordan Lee, NP Tinnie Redman, PharmD   Please be sure to bring in all your medications bottles to every appointment.   Need to Contact Us :  If you have any questions or concerns before your next appointment please send us  a message through [facility] or call our office at [phone].    TO LEAVE A MESSAGE FOR THE NURSE SELECT OPTION 2, PLEASE LEAVE A MESSAGE INCLUDING: YOUR NAME DATE OF BIRTH CALL BACK NUMBER REASON FOR CALL**this is important as we prioritize the call backs  YOU WILL RECEIVE A CALL BACK THE SAME DAY AS LONG AS YOU CALL BEFORE 4:00 PM

## 2024-05-16 ENCOUNTER — Telehealth (HOSPITAL_COMMUNITY): Payer: Self-pay

## 2024-05-16 NOTE — Telephone Encounter (Signed)
Attempted to call patient in regards to Pulmonary Rehab - LM on VM   Sent letter 

## 2024-05-21 ENCOUNTER — Telehealth (HOSPITAL_COMMUNITY): Payer: Self-pay

## 2024-05-21 NOTE — Telephone Encounter (Signed)
 Patient returned phone call.  She stated that she was not interested in Pulmonary rehab as of yet until she can see Pulmonologist again. Advised that referral was good for a year. Closed referral.

## 2024-05-23 ENCOUNTER — Ambulatory Visit (HOSPITAL_COMMUNITY)
Admission: RE | Admit: 2024-05-23 | Discharge: 2024-05-23 | Disposition: A | Discharge status: Home / Self Care | Source: Ambulatory Visit | Attending: Internal Medicine | Admitting: Internal Medicine

## 2024-05-23 DIAGNOSIS — I517 Cardiomegaly: Secondary | ICD-10-CM | POA: Diagnosis not present

## 2024-05-23 DIAGNOSIS — I082 Rheumatic disorders of both aortic and tricuspid valves: Secondary | ICD-10-CM | POA: Insufficient documentation

## 2024-05-23 DIAGNOSIS — I50812 Chronic right heart failure: Secondary | ICD-10-CM | POA: Diagnosis present

## 2024-05-23 DIAGNOSIS — I2721 Secondary pulmonary arterial hypertension: Secondary | ICD-10-CM | POA: Diagnosis not present

## 2024-05-23 LAB — ECHOCARDIOGRAM COMPLETE
Area-P 1/2: 3.61 cm2
S' Lateral: 2.2 cm

## 2024-05-25 ENCOUNTER — Telehealth (HOSPITAL_COMMUNITY): Payer: Self-pay

## 2024-05-25 NOTE — Telephone Encounter (Signed)
 Patient called requesting echo results. Please advise

## 2024-05-29 ENCOUNTER — Telehealth (HOSPITAL_COMMUNITY): Payer: Self-pay | Admitting: Cardiology

## 2024-05-29 NOTE — Telephone Encounter (Signed)
 Patient called to report Joesph Inoue and [facility] providers are requesting ok from cardiology that its ok to take adderall and xanax .   Reports they may also need copy of EKG  Patient is having a hard time with refills and establishing care with PCP to manage these medications.    Advised can forward to provider for cardiology input for ok . Provider is not likely to refill, will likely defer to providers above   -pr reports she is on her last 3 pills, given behavioral health ER/urgent care/walk in information

## 2024-06-05 NOTE — Telephone Encounter (Signed)
 Pt aware.

## 2024-06-05 NOTE — Telephone Encounter (Signed)
 At the request of the patient-information sent to [facility] via epic routing fax# [phone]

## 2024-06-11 ENCOUNTER — Ambulatory Visit: Payer: Self-pay

## 2024-06-11 NOTE — Telephone Encounter (Signed)
 FYI Only or Action Required?: Action required by provider: request for appointment, clinical question for provider, and update on patient condition.  Patient was last seen in primary care on: new patient.  Called Nurse Triage reporting Appointment.  Symptoms began a week ago.  Interventions attempted: Prescription medications: adderrall.  Symptoms are: gradually worsening.  Triage Disposition: Call PCP Now  Patient/caregiver understands and will follow disposition?: Yes     Message from Alfonso ORN sent at 06/11/2024  5:16 PM EST  Reason for Triage: pt loses things , crying all the time,not sleeping well pt was told not to stop taking medication suddenly but having issue with getting fill for Medication : Adderall Pt is wanting to establish care with dr. geni wallace       Reason for Disposition  [1] Prescription refill request for ESSENTIAL medicine (i.e., likelihood of harm to patient if not taken) AND [2] triager unable to refill per department policy  Answer Assessment - Initial Assessment Questions 1. DRUG NAME: What medicine do you need to have refilled?     Pt called requesting to establish care with Dr. Prentiss; pt previous saw provider at [facility]. Pt is needing refill of Adderral and has been without medication x 1 week. Pt is very tearful, having sleep disturbances and increased daytime fatigue/ brain fog. Pt states she forgets everything, even if it is right in front of her. Discussed that pt was to take Adderrall 10mg , two pills in am and one at noon but had been taking adderral 10mg  daily to help stretch out last rx as her doctor retired. Pt states she has reached out to her other providers and no one is willing to refill. Discussed one time refill at [facility] vs [facility]. Pt states with weather, she is stuck at home and she relies on her son for transport d/t not being able to drive. Attempted to schedule pt for virtual UC visit for med refill until appt 07/30/24 but  d/t pt not being established, unable to set up virtual appt. Pt is willing to set up virtual appt and keep in clinic appt as she is very eager to see Dr. Prentiss. Discussed this would be provider preference especially as pt would have to be seen for refill. She voiced understanding. Pt is available via phone and my chart.  Protocols used: Medication Refill and Renewal Call-A-AH

## 2024-06-20 ENCOUNTER — Ambulatory Visit: Admitting: Family Medicine

## 2024-07-30 ENCOUNTER — Ambulatory Visit: Admitting: Family Medicine
